# Patient Record
Sex: Female | Born: 1935
Health system: Southern US, Community
[De-identification: ages and names within clinical notes are randomized; demographics above are authoritative.]

## PROBLEM LIST (undated history)

## (undated) DIAGNOSIS — N952 Postmenopausal atrophic vaginitis: Secondary | ICD-10-CM

## (undated) DIAGNOSIS — H409 Unspecified glaucoma: Secondary | ICD-10-CM

## (undated) DIAGNOSIS — I8390 Asymptomatic varicose veins of unspecified lower extremity: Secondary | ICD-10-CM

## (undated) DIAGNOSIS — J159 Unspecified bacterial pneumonia: Secondary | ICD-10-CM

## (undated) DIAGNOSIS — E559 Vitamin D deficiency, unspecified: Secondary | ICD-10-CM

## (undated) DIAGNOSIS — G4700 Insomnia, unspecified: Secondary | ICD-10-CM

## (undated) DIAGNOSIS — R7303 Prediabetes: Secondary | ICD-10-CM

## (undated) DIAGNOSIS — I059 Rheumatic mitral valve disease, unspecified: Secondary | ICD-10-CM

## (undated) DIAGNOSIS — N816 Rectocele: Secondary | ICD-10-CM

## (undated) DIAGNOSIS — N951 Menopausal and female climacteric states: Secondary | ICD-10-CM

## (undated) DIAGNOSIS — R351 Nocturia: Secondary | ICD-10-CM

## (undated) DIAGNOSIS — IMO0002 Reserved for concepts with insufficient information to code with codable children: Secondary | ICD-10-CM

## (undated) HISTORY — DX: Postmenopausal atrophic vaginitis: N95.2

## (undated) HISTORY — PX: VAGINAL HYSTERECTOMY: SUR661

## (undated) HISTORY — DX: Rheumatic mitral valve disease, unspecified: I05.9

## (undated) HISTORY — DX: Insomnia, unspecified: G47.00

## (undated) HISTORY — DX: Menopausal and female climacteric states: N95.1

## (undated) HISTORY — PX: FOOT SURGERY: SHX648

## (undated) HISTORY — PX: BREAST BIOPSY: SHX20

## (undated) HISTORY — DX: Unspecified bacterial pneumonia: J15.9

## (undated) HISTORY — PX: EYE SURGERY: SHX253

## (undated) HISTORY — DX: Nocturia: R35.1

## (undated) HISTORY — DX: Rectocele: N81.6

## (undated) HISTORY — DX: Vitamin D deficiency, unspecified: E55.9

## (undated) HISTORY — DX: Reserved for concepts with insufficient information to code with codable children: IMO0002

## (undated) HISTORY — DX: Prediabetes: R73.03

## (undated) HISTORY — DX: Asymptomatic varicose veins of unspecified lower extremity: I83.90

## (undated) HISTORY — DX: Unspecified glaucoma: H40.9

---

## 2005-05-06 ENCOUNTER — Ambulatory Visit: Payer: Self-pay | Admitting: Ophthalmology

## 2005-08-12 ENCOUNTER — Ambulatory Visit: Payer: Self-pay | Admitting: Gastroenterology

## 2006-07-21 ENCOUNTER — Ambulatory Visit: Payer: Self-pay | Admitting: Cardiology

## 2007-09-19 ENCOUNTER — Encounter: Payer: Self-pay | Admitting: General Practice

## 2007-09-20 ENCOUNTER — Encounter: Payer: Self-pay | Admitting: General Practice

## 2008-03-12 ENCOUNTER — Ambulatory Visit: Payer: Self-pay | Admitting: Unknown Physician Specialty

## 2008-05-15 ENCOUNTER — Ambulatory Visit: Payer: Self-pay | Admitting: Unknown Physician Specialty

## 2009-03-18 ENCOUNTER — Ambulatory Visit: Payer: Self-pay | Admitting: Cardiology

## 2011-03-03 ENCOUNTER — Emergency Department: Payer: Self-pay | Admitting: Emergency Medicine

## 2011-05-21 ENCOUNTER — Ambulatory Visit: Payer: Self-pay | Admitting: Oncology

## 2011-05-25 ENCOUNTER — Ambulatory Visit: Payer: Self-pay | Admitting: Family Medicine

## 2011-06-20 ENCOUNTER — Ambulatory Visit: Payer: Self-pay | Admitting: Oncology

## 2011-09-21 ENCOUNTER — Ambulatory Visit: Payer: Self-pay | Admitting: Family Medicine

## 2011-09-28 ENCOUNTER — Ambulatory Visit: Payer: Self-pay | Admitting: Specialist

## 2012-06-27 DIAGNOSIS — H269 Unspecified cataract: Secondary | ICD-10-CM | POA: Insufficient documentation

## 2012-08-08 DIAGNOSIS — H401490 Capsular glaucoma with pseudoexfoliation of lens, unspecified eye, stage unspecified: Secondary | ICD-10-CM | POA: Insufficient documentation

## 2012-10-03 ENCOUNTER — Ambulatory Visit: Payer: Self-pay | Admitting: Specialist

## 2013-12-28 DIAGNOSIS — H6120 Impacted cerumen, unspecified ear: Secondary | ICD-10-CM | POA: Diagnosis not present

## 2013-12-28 DIAGNOSIS — J301 Allergic rhinitis due to pollen: Secondary | ICD-10-CM | POA: Diagnosis not present

## 2013-12-28 DIAGNOSIS — J069 Acute upper respiratory infection, unspecified: Secondary | ICD-10-CM | POA: Diagnosis not present

## 2014-01-08 DIAGNOSIS — E782 Mixed hyperlipidemia: Secondary | ICD-10-CM | POA: Diagnosis not present

## 2014-01-08 DIAGNOSIS — R002 Palpitations: Secondary | ICD-10-CM | POA: Diagnosis not present

## 2014-02-28 DIAGNOSIS — H4011X0 Primary open-angle glaucoma, stage unspecified: Secondary | ICD-10-CM | POA: Diagnosis not present

## 2014-02-28 DIAGNOSIS — H409 Unspecified glaucoma: Secondary | ICD-10-CM | POA: Diagnosis not present

## 2014-03-06 DIAGNOSIS — J301 Allergic rhinitis due to pollen: Secondary | ICD-10-CM | POA: Diagnosis not present

## 2014-03-06 DIAGNOSIS — J029 Acute pharyngitis, unspecified: Secondary | ICD-10-CM | POA: Diagnosis not present

## 2014-05-08 DIAGNOSIS — D235 Other benign neoplasm of skin of trunk: Secondary | ICD-10-CM | POA: Diagnosis not present

## 2014-05-08 DIAGNOSIS — L259 Unspecified contact dermatitis, unspecified cause: Secondary | ICD-10-CM | POA: Diagnosis not present

## 2014-05-23 DIAGNOSIS — H4011X0 Primary open-angle glaucoma, stage unspecified: Secondary | ICD-10-CM | POA: Diagnosis not present

## 2014-05-23 DIAGNOSIS — H409 Unspecified glaucoma: Secondary | ICD-10-CM | POA: Diagnosis not present

## 2014-06-03 DIAGNOSIS — S0990XA Unspecified injury of head, initial encounter: Secondary | ICD-10-CM | POA: Diagnosis not present

## 2014-06-03 DIAGNOSIS — S0180XA Unspecified open wound of other part of head, initial encounter: Secondary | ICD-10-CM | POA: Diagnosis not present

## 2014-06-04 ENCOUNTER — Ambulatory Visit: Payer: Self-pay | Admitting: Physician Assistant

## 2014-06-04 DIAGNOSIS — S0990XA Unspecified injury of head, initial encounter: Secondary | ICD-10-CM | POA: Diagnosis not present

## 2014-07-05 DIAGNOSIS — J069 Acute upper respiratory infection, unspecified: Secondary | ICD-10-CM | POA: Diagnosis not present

## 2014-07-19 DIAGNOSIS — J0100 Acute maxillary sinusitis, unspecified: Secondary | ICD-10-CM | POA: Diagnosis not present

## 2014-07-19 DIAGNOSIS — H6120 Impacted cerumen, unspecified ear: Secondary | ICD-10-CM | POA: Diagnosis not present

## 2014-07-19 DIAGNOSIS — R040 Epistaxis: Secondary | ICD-10-CM | POA: Diagnosis not present

## 2014-08-13 DIAGNOSIS — I10 Essential (primary) hypertension: Secondary | ICD-10-CM | POA: Diagnosis not present

## 2014-08-13 DIAGNOSIS — Z8679 Personal history of other diseases of the circulatory system: Secondary | ICD-10-CM | POA: Diagnosis not present

## 2014-08-16 DIAGNOSIS — J301 Allergic rhinitis due to pollen: Secondary | ICD-10-CM | POA: Diagnosis not present

## 2014-09-12 DIAGNOSIS — L819 Disorder of pigmentation, unspecified: Secondary | ICD-10-CM | POA: Diagnosis not present

## 2014-09-12 DIAGNOSIS — L821 Other seborrheic keratosis: Secondary | ICD-10-CM | POA: Diagnosis not present

## 2014-09-15 DIAGNOSIS — S6710XA Crushing injury of unspecified finger(s), initial encounter: Secondary | ICD-10-CM | POA: Diagnosis not present

## 2014-09-16 DIAGNOSIS — T148XXA Other injury of unspecified body region, initial encounter: Secondary | ICD-10-CM | POA: Diagnosis not present

## 2014-09-26 DIAGNOSIS — S67190A Crushing injury of right index finger, initial encounter: Secondary | ICD-10-CM | POA: Diagnosis not present

## 2014-10-16 DIAGNOSIS — H4011X3 Primary open-angle glaucoma, severe stage: Secondary | ICD-10-CM | POA: Diagnosis not present

## 2014-10-23 DIAGNOSIS — R7309 Other abnormal glucose: Secondary | ICD-10-CM | POA: Diagnosis not present

## 2014-10-23 DIAGNOSIS — N812 Incomplete uterovaginal prolapse: Secondary | ICD-10-CM | POA: Diagnosis not present

## 2014-10-23 DIAGNOSIS — Z01419 Encounter for gynecological examination (general) (routine) without abnormal findings: Secondary | ICD-10-CM | POA: Diagnosis not present

## 2014-10-23 DIAGNOSIS — N952 Postmenopausal atrophic vaginitis: Secondary | ICD-10-CM | POA: Diagnosis not present

## 2014-10-23 DIAGNOSIS — Z1211 Encounter for screening for malignant neoplasm of colon: Secondary | ICD-10-CM | POA: Diagnosis not present

## 2014-10-23 DIAGNOSIS — N8110 Cystocele, unspecified: Secondary | ICD-10-CM | POA: Diagnosis not present

## 2014-10-23 DIAGNOSIS — N816 Rectocele: Secondary | ICD-10-CM | POA: Diagnosis not present

## 2014-10-23 DIAGNOSIS — N951 Menopausal and female climacteric states: Secondary | ICD-10-CM | POA: Diagnosis not present

## 2014-10-25 DIAGNOSIS — Z1211 Encounter for screening for malignant neoplasm of colon: Secondary | ICD-10-CM | POA: Diagnosis not present

## 2015-01-03 DIAGNOSIS — R7309 Other abnormal glucose: Secondary | ICD-10-CM | POA: Diagnosis not present

## 2015-01-03 DIAGNOSIS — Z01419 Encounter for gynecological examination (general) (routine) without abnormal findings: Secondary | ICD-10-CM | POA: Diagnosis not present

## 2015-01-03 DIAGNOSIS — I059 Rheumatic mitral valve disease, unspecified: Secondary | ICD-10-CM | POA: Diagnosis not present

## 2015-01-03 DIAGNOSIS — I10 Essential (primary) hypertension: Secondary | ICD-10-CM | POA: Diagnosis not present

## 2015-01-03 DIAGNOSIS — E559 Vitamin D deficiency, unspecified: Secondary | ICD-10-CM | POA: Diagnosis not present

## 2015-01-09 DIAGNOSIS — H4011X3 Primary open-angle glaucoma, severe stage: Secondary | ICD-10-CM | POA: Diagnosis not present

## 2015-01-09 DIAGNOSIS — H401123 Primary open-angle glaucoma, left eye, severe stage: Secondary | ICD-10-CM | POA: Insufficient documentation

## 2015-01-09 DIAGNOSIS — H4011X2 Primary open-angle glaucoma, moderate stage: Secondary | ICD-10-CM | POA: Diagnosis not present

## 2015-01-17 DIAGNOSIS — H6121 Impacted cerumen, right ear: Secondary | ICD-10-CM | POA: Diagnosis not present

## 2015-01-17 DIAGNOSIS — J0100 Acute maxillary sinusitis, unspecified: Secondary | ICD-10-CM | POA: Diagnosis not present

## 2015-01-17 DIAGNOSIS — J302 Other seasonal allergic rhinitis: Secondary | ICD-10-CM | POA: Diagnosis not present

## 2015-01-22 DIAGNOSIS — H6121 Impacted cerumen, right ear: Secondary | ICD-10-CM | POA: Diagnosis not present

## 2015-01-22 DIAGNOSIS — H93299 Other abnormal auditory perceptions, unspecified ear: Secondary | ICD-10-CM | POA: Diagnosis not present

## 2015-01-22 DIAGNOSIS — J0181 Other acute recurrent sinusitis: Secondary | ICD-10-CM | POA: Diagnosis not present

## 2015-02-05 DIAGNOSIS — H4011X3 Primary open-angle glaucoma, severe stage: Secondary | ICD-10-CM | POA: Diagnosis not present

## 2015-02-13 DIAGNOSIS — Z87898 Personal history of other specified conditions: Secondary | ICD-10-CM | POA: Diagnosis not present

## 2015-02-13 DIAGNOSIS — I10 Essential (primary) hypertension: Secondary | ICD-10-CM | POA: Diagnosis not present

## 2015-02-13 DIAGNOSIS — R0602 Shortness of breath: Secondary | ICD-10-CM | POA: Diagnosis not present

## 2015-02-21 ENCOUNTER — Emergency Department: Payer: Self-pay | Admitting: Emergency Medicine

## 2015-02-21 DIAGNOSIS — N179 Acute kidney failure, unspecified: Secondary | ICD-10-CM | POA: Diagnosis not present

## 2015-02-21 DIAGNOSIS — J449 Chronic obstructive pulmonary disease, unspecified: Secondary | ICD-10-CM | POA: Diagnosis not present

## 2015-02-21 DIAGNOSIS — Z79899 Other long term (current) drug therapy: Secondary | ICD-10-CM | POA: Diagnosis not present

## 2015-02-21 DIAGNOSIS — R509 Fever, unspecified: Secondary | ICD-10-CM | POA: Diagnosis not present

## 2015-02-21 DIAGNOSIS — R0602 Shortness of breath: Secondary | ICD-10-CM | POA: Diagnosis not present

## 2015-02-21 DIAGNOSIS — J189 Pneumonia, unspecified organism: Secondary | ICD-10-CM | POA: Diagnosis not present

## 2015-02-21 DIAGNOSIS — R7989 Other specified abnormal findings of blood chemistry: Secondary | ICD-10-CM | POA: Diagnosis not present

## 2015-02-21 DIAGNOSIS — R05 Cough: Secondary | ICD-10-CM | POA: Diagnosis not present

## 2015-02-24 DIAGNOSIS — R300 Dysuria: Secondary | ICD-10-CM | POA: Diagnosis not present

## 2015-02-24 DIAGNOSIS — R3915 Urgency of urination: Secondary | ICD-10-CM | POA: Diagnosis not present

## 2015-04-15 DIAGNOSIS — R3915 Urgency of urination: Secondary | ICD-10-CM | POA: Diagnosis not present

## 2015-04-15 DIAGNOSIS — N9489 Other specified conditions associated with female genital organs and menstrual cycle: Secondary | ICD-10-CM | POA: Diagnosis not present

## 2015-04-15 DIAGNOSIS — N951 Menopausal and female climacteric states: Secondary | ICD-10-CM | POA: Diagnosis not present

## 2015-04-15 DIAGNOSIS — Z833 Family history of diabetes mellitus: Secondary | ICD-10-CM | POA: Diagnosis not present

## 2015-04-15 DIAGNOSIS — M549 Dorsalgia, unspecified: Secondary | ICD-10-CM | POA: Diagnosis not present

## 2015-04-15 DIAGNOSIS — I059 Rheumatic mitral valve disease, unspecified: Secondary | ICD-10-CM | POA: Diagnosis not present

## 2015-04-16 DIAGNOSIS — R0602 Shortness of breath: Secondary | ICD-10-CM | POA: Diagnosis not present

## 2015-05-22 DIAGNOSIS — H4011X3 Primary open-angle glaucoma, severe stage: Secondary | ICD-10-CM | POA: Diagnosis not present

## 2015-05-27 DIAGNOSIS — D2371 Other benign neoplasm of skin of right lower limb, including hip: Secondary | ICD-10-CM | POA: Diagnosis not present

## 2015-05-27 DIAGNOSIS — M2011 Hallux valgus (acquired), right foot: Secondary | ICD-10-CM | POA: Diagnosis not present

## 2015-05-27 DIAGNOSIS — M79671 Pain in right foot: Secondary | ICD-10-CM | POA: Diagnosis not present

## 2015-05-27 DIAGNOSIS — M19271 Secondary osteoarthritis, right ankle and foot: Secondary | ICD-10-CM | POA: Diagnosis not present

## 2015-06-13 ENCOUNTER — Other Ambulatory Visit: Payer: Self-pay

## 2015-06-13 ENCOUNTER — Other Ambulatory Visit: Payer: Medicare Other

## 2015-06-13 DIAGNOSIS — R7309 Other abnormal glucose: Secondary | ICD-10-CM | POA: Diagnosis not present

## 2015-06-13 DIAGNOSIS — Z833 Family history of diabetes mellitus: Secondary | ICD-10-CM

## 2015-06-13 DIAGNOSIS — I059 Rheumatic mitral valve disease, unspecified: Secondary | ICD-10-CM | POA: Diagnosis not present

## 2015-06-13 DIAGNOSIS — R636 Underweight: Secondary | ICD-10-CM | POA: Diagnosis not present

## 2015-06-14 LAB — LIPID PANEL
CHOL/HDL RATIO: 2.8 ratio (ref 0.0–4.4)
Cholesterol, Total: 164 mg/dL (ref 100–199)
HDL: 59 mg/dL (ref >39)
LDL Calculated: 86 mg/dL (ref 0–99)
TRIGLYCERIDES: 93 mg/dL (ref 0–149)
VLDL CHOLESTEROL CAL: 19 mg/dL (ref 5–40)

## 2015-06-14 LAB — HEMOGLOBIN A1C
ESTIMATED AVERAGE GLUCOSE: 123 mg/dL
Hgb A1c MFr Bld: 5.9 % — ABNORMAL HIGH (ref 4.8–5.6)

## 2015-06-14 LAB — GLUCOSE, RANDOM: GLUCOSE: 85 mg/dL (ref 65–99)

## 2015-06-16 ENCOUNTER — Telehealth: Payer: Self-pay

## 2015-06-16 NOTE — Telephone Encounter (Signed)
-----   Message from Brayton Mars, MD sent at 06/15/2015  7:09 PM EDT ----- Please notify - Abnormal Labs HgbA1C slightly elevated. Repeat in 6 months. Encourage diet/exercise. FBS and Lipid 1 are normal.

## 2015-06-16 NOTE — Telephone Encounter (Signed)
done

## 2015-06-16 NOTE — Telephone Encounter (Signed)
Pt aware. Will f/u with Mad on 06/18/2015. Will send to [facility] to be put on recall schedule.

## 2015-06-18 ENCOUNTER — Encounter: Payer: Self-pay | Admitting: Obstetrics and Gynecology

## 2015-06-18 ENCOUNTER — Ambulatory Visit (INDEPENDENT_AMBULATORY_CARE_PROVIDER_SITE_OTHER): Payer: Medicare Other | Admitting: Obstetrics and Gynecology

## 2015-06-18 VITALS — BP 124/69 | HR 84 | Ht 60.0 in | Wt 95.7 lb

## 2015-06-18 DIAGNOSIS — Z1239 Encounter for other screening for malignant neoplasm of breast: Secondary | ICD-10-CM | POA: Diagnosis not present

## 2015-06-18 DIAGNOSIS — R7309 Other abnormal glucose: Secondary | ICD-10-CM

## 2015-06-18 DIAGNOSIS — Z780 Asymptomatic menopausal state: Secondary | ICD-10-CM | POA: Diagnosis not present

## 2015-06-18 DIAGNOSIS — R636 Underweight: Secondary | ICD-10-CM

## 2015-06-18 DIAGNOSIS — R7303 Prediabetes: Secondary | ICD-10-CM

## 2015-06-18 NOTE — Progress Notes (Signed)
Patient ID: Sierra Benson, female   DOB: 1929-03-08, 81 y.o.   MRN: [id number] Pt presents for lab results. No using estrace cream. Concerned about ca risk.   OB/GYN CONFERENCE NOTE:  Subjective:       Sierra Benson is a 81 y.o. G25P3003 female who presents for a conference appointment. Current complaints include: 1.  Weight loss. 2.  Prediabetes.  Patient presents today for follow-up on above issues.  She is having difficulty maintaining weight.  We have recommended that she be referred to nutritionist for counseling and retention.  Patient has a slightly elevated hemoglobin A1c.  Multiple questions regarding this condition were addressed.  Patient is to continue eating healthy, exercising, and is to follow up with nutritionist as previously mentioned.    Gynecologic History No LMP recorded. Patient has had a hysterectomy.  Obstetric History OB History  Gravida Para Term Preterm AB SAB TAB Ectopic Multiple Living  3 3 3       3     # Outcome Date GA Lbr Len/2nd Weight Sex Delivery Anes PTL Lv  3 Term 1961   7 lb 4.8 oz (3.311 kg) F VBAC   Y  2 Term 1956   6 lb 8 oz (2.948 kg) M Vag-Spont   Y  1 Term 1954   6 lb 4.8 oz (2.858 kg) M Vag-Spont   Y      Past Medical History  Diagnosis Date  . Vitamin D deficiency   . Mitral valve disorder   . Menopausal state   . Varicose veins   . Glaucoma   . Nocturia   . Rectocele   . Vaginal atrophy   . Cystocele   . Prediabetes 06/19/2015    Past Surgical History  Procedure Laterality Date  . Foot surgery    . Vaginal hysterectomy      menorrhagia  . Eye surgery Left     cataract removed  . Breast biopsy Right     benign nodule    No current outpatient prescriptions on file prior to visit.   No current facility-administered medications on file prior to visit.    Allergies  Allergen Reactions  . Propoxyphene Hives  . Celecoxib Nausea And Vomiting  . Ibuprofen Other (See Comments)  . Prednisone Other (See Comments)     Prednisone Intensol    History   Social History  . Marital Status: Married    Spouse Name: N/A  . Number of Children: N/A  . Years of Education: N/A   Occupational History  . Not on file.   Social History Main Topics  . Smoking status: Never Smoker   . Smokeless tobacco: Not on file  . Alcohol Use: No  . Drug Use: No  . Sexual Activity: Yes    Birth Control/ Protection: Surgical   Other Topics Concern  . Not on file   Social History Narrative    Family History  Problem Relation Age of Onset  . Diabetes Brother   . Lung cancer Brother   . Breast cancer Sister   . Ovarian cancer Neg Hx   . Colon cancer Neg Hx   . Heart disease Neg Hx     The following portions of the patient's history were reviewed and updated as appropriate: allergies, current medications, past family history, past medical history, past social history, past surgical history and problem list.  Review of Systems    Objective:   BP 124/69 mmHg  Pulse 84  Ht 5' (  1.524 m)  Wt 95 lb 11.2 oz (43.409 kg)  BMI 18.69 kg/m2   Assessment/Plan:   1.  Weight loss/underweight.  Last Center consultation  2.  Prediabetes  Continue with healthy diet/exercise/recheck in 6 months.-Hemoglobin A1c     Time: 15 minutes  Return to Clinic:Annual exam as scheduled  A total of 15 minutes were spent face-to-face with the patient during this encounter and over half of that time dealt with counseling and coordination of care.      Brayton Mars, MD [facility]

## 2015-06-19 ENCOUNTER — Encounter: Payer: Self-pay | Admitting: Obstetrics and Gynecology

## 2015-06-19 DIAGNOSIS — R7303 Prediabetes: Secondary | ICD-10-CM

## 2015-06-19 DIAGNOSIS — R636 Underweight: Secondary | ICD-10-CM | POA: Insufficient documentation

## 2015-06-19 DIAGNOSIS — I10 Essential (primary) hypertension: Secondary | ICD-10-CM | POA: Insufficient documentation

## 2015-06-19 DIAGNOSIS — Z87898 Personal history of other specified conditions: Secondary | ICD-10-CM | POA: Insufficient documentation

## 2015-06-19 HISTORY — DX: Prediabetes: R73.03

## 2015-06-24 DIAGNOSIS — M2011 Hallux valgus (acquired), right foot: Secondary | ICD-10-CM | POA: Diagnosis not present

## 2015-06-24 DIAGNOSIS — M19271 Secondary osteoarthritis, right ankle and foot: Secondary | ICD-10-CM | POA: Diagnosis not present

## 2015-06-24 DIAGNOSIS — D2371 Other benign neoplasm of skin of right lower limb, including hip: Secondary | ICD-10-CM | POA: Diagnosis not present

## 2015-06-24 DIAGNOSIS — M79671 Pain in right foot: Secondary | ICD-10-CM | POA: Diagnosis not present

## 2015-07-30 ENCOUNTER — Encounter: Payer: Medicare Other | Attending: Obstetrics and Gynecology | Admitting: Dietician

## 2015-07-30 VITALS — Ht 60.0 in | Wt 95.0 lb

## 2015-07-30 DIAGNOSIS — R636 Underweight: Secondary | ICD-10-CM | POA: Diagnosis not present

## 2015-07-30 NOTE — Patient Instructions (Signed)
   Include a bedtime snack daily, such as crackers with peanut butter and a glass of milk.  Other snack ideas: fruit with nuts or cheese, dry cereal with some nuts and fruit or chocolate.  A small afternoon snack can add another 50-100 calories and further help with some weight gain.

## 2015-07-30 NOTE — Progress Notes (Signed)
Medical Nutrition Therapy: Visit start time: 1330  end time: 1430  Assessment:  Diagnosis: underweight Past medical history: pre-diabetes; patient reports occasional small rash of unknown cause Psychosocial issues/ stress concerns: none per patient Preferred learning method:  . Auditory . Hands-on   Current weight: 95lbs  Height: 5'0" Medications, supplements: reviewed list in chart with patient Progress and evaluation: patient reports recently elevated HbA1C test (5.7%). She decreased her intake of breads and sweets since then and has lost 5lbs.          She would like to regain the lost weight and prevent further increase in blood sugars.          She reports she has always been about 100 - 105 lbs, has a healthy appetite and high metabolism.         She tried drinking a nutrition shake for an afternoon snack, but then was not hungry for supper.   Physical activity: stretches, calisthenics 20 minutes, 3 times per week  Dietary Intake:  Usual eating pattern includes 3 meals and 0-1 snacks per day. Dining out frequency: 5-7 meals per week.  Breakfast: 2 eggs with some cheese, 1pc toast, 1/2 orange, blueberries, 1 small tomato. Rarely bacon. Alternates with cereal -- [employer] 100% whole grain Os Snack: usually none Lunch: salad with grilled chicken, pb and banana sandwich. Lite wheat bread nature's own Snack: 8/9 watermelon. Not regularly Supper: chicken, rice, pasta often. Scallops, potato, 1 roll, salad at [employer] Husband cooks balanced meal.  Snack: occasional peanut butter crackers, milk; or ice cream.  Beverages: water, coffee 2c in am, sometimes sprite or coke with lunch.   Nutrition Care Education: Topics covered: weight gain and blood sugar control Basic nutrition: appropriate nutrient balance, appropriate meal and snack schedule Weight control: determining reasonable weight goal, healthy high calorie foods. Advised 1500kcal daily goal, and achieving that by adding 1-2  snacks daily.  Other lifestyle changes:  Appropriate carbohydrate intake and choices, HbA1C test meaning and goal, diabetic level.  Nutritional Diagnosis:  [place]-3.1 Underweight As related to decreased caloric intake.  As evidenced by patient report, BMI 18.55.  Intervention: Instruction as noted above.   Assured patient that her HbA1C is still at a good level, and limiting sugary drinks and large portions of sweets would likely be adequate diet restrictions at this point.   Advised patient to include a bedtime snack and an afternoon snack daily, including peanut butter or nuts with fruit or bread or crackers to boost kcal intake by 100-200kcal daily.   Did not schedule follow-up at this time but encouraged patient to call and schedule if she finds she needs further RD assistance.   Education Materials given:  Marland Kitchen Goals/ instructions . Gaining Weight the Healthy Way (RD 411)  Learner/ who was taught:  . Patient   Level of understanding: Marland Kitchen Verbalizes/ demonstrates competency  Demonstrated degree of understanding via:   Teach back Learning barriers: . None  Willingness to learn/ readiness for change: . Eager, change in progress   Monitoring and Evaluation:  Dietary intake and body weight      follow up: prn

## 2015-08-26 DIAGNOSIS — E782 Mixed hyperlipidemia: Secondary | ICD-10-CM | POA: Diagnosis not present

## 2015-08-26 DIAGNOSIS — I10 Essential (primary) hypertension: Secondary | ICD-10-CM | POA: Diagnosis not present

## 2015-08-26 DIAGNOSIS — R0602 Shortness of breath: Secondary | ICD-10-CM | POA: Diagnosis not present

## 2015-08-26 DIAGNOSIS — R7309 Other abnormal glucose: Secondary | ICD-10-CM | POA: Diagnosis not present

## 2015-08-26 DIAGNOSIS — Z87898 Personal history of other specified conditions: Secondary | ICD-10-CM | POA: Diagnosis not present

## 2015-09-10 DIAGNOSIS — D2261 Melanocytic nevi of right upper limb, including shoulder: Secondary | ICD-10-CM | POA: Diagnosis not present

## 2015-09-10 DIAGNOSIS — D225 Melanocytic nevi of trunk: Secondary | ICD-10-CM | POA: Diagnosis not present

## 2015-09-10 DIAGNOSIS — L570 Actinic keratosis: Secondary | ICD-10-CM | POA: Diagnosis not present

## 2015-09-10 DIAGNOSIS — D2272 Melanocytic nevi of left lower limb, including hip: Secondary | ICD-10-CM | POA: Diagnosis not present

## 2015-09-10 DIAGNOSIS — X32XXXA Exposure to sunlight, initial encounter: Secondary | ICD-10-CM | POA: Diagnosis not present

## 2015-09-10 DIAGNOSIS — D2271 Melanocytic nevi of right lower limb, including hip: Secondary | ICD-10-CM | POA: Diagnosis not present

## 2015-09-11 ENCOUNTER — Telehealth: Payer: Self-pay | Admitting: Obstetrics and Gynecology

## 2015-09-11 NOTE — Telephone Encounter (Signed)
[facility] told her we need to fax a order for bone density and mammogram.  fax [phone]           phone [phone]

## 2015-09-11 NOTE — Telephone Encounter (Signed)
Pt aware per Mckay at [facility] there is a order on file- it is good until 10/24/2015. Pt states she will try to schedule before 11/4.

## 2015-09-25 DIAGNOSIS — R7303 Prediabetes: Secondary | ICD-10-CM | POA: Diagnosis not present

## 2015-09-25 DIAGNOSIS — E782 Mixed hyperlipidemia: Secondary | ICD-10-CM | POA: Diagnosis not present

## 2015-09-25 DIAGNOSIS — I10 Essential (primary) hypertension: Secondary | ICD-10-CM | POA: Diagnosis not present

## 2015-10-23 DIAGNOSIS — M810 Age-related osteoporosis without current pathological fracture: Secondary | ICD-10-CM | POA: Diagnosis not present

## 2015-10-23 DIAGNOSIS — M859 Disorder of bone density and structure, unspecified: Secondary | ICD-10-CM | POA: Diagnosis not present

## 2015-10-23 DIAGNOSIS — E2839 Other primary ovarian failure: Secondary | ICD-10-CM | POA: Diagnosis not present

## 2015-10-23 DIAGNOSIS — Z1231 Encounter for screening mammogram for malignant neoplasm of breast: Secondary | ICD-10-CM | POA: Diagnosis not present

## 2015-10-28 ENCOUNTER — Ambulatory Visit (INDEPENDENT_AMBULATORY_CARE_PROVIDER_SITE_OTHER): Payer: Medicare Other | Admitting: Obstetrics and Gynecology

## 2015-10-28 ENCOUNTER — Encounter: Payer: Self-pay | Admitting: Obstetrics and Gynecology

## 2015-10-28 VITALS — BP 128/74 | HR 89 | Ht 60.0 in | Wt 99.1 lb

## 2015-10-28 DIAGNOSIS — Z124 Encounter for screening for malignant neoplasm of cervix: Secondary | ICD-10-CM

## 2015-10-28 DIAGNOSIS — Z1211 Encounter for screening for malignant neoplasm of colon: Secondary | ICD-10-CM | POA: Diagnosis not present

## 2015-10-28 DIAGNOSIS — K625 Hemorrhage of anus and rectum: Secondary | ICD-10-CM | POA: Diagnosis not present

## 2015-10-28 DIAGNOSIS — Z0000 Encounter for general adult medical examination without abnormal findings: Secondary | ICD-10-CM | POA: Diagnosis not present

## 2015-10-28 DIAGNOSIS — Z780 Asymptomatic menopausal state: Secondary | ICD-10-CM | POA: Diagnosis not present

## 2015-10-28 DIAGNOSIS — R1031 Right lower quadrant pain: Secondary | ICD-10-CM

## 2015-10-28 DIAGNOSIS — R636 Underweight: Secondary | ICD-10-CM

## 2015-10-28 DIAGNOSIS — M545 Low back pain: Secondary | ICD-10-CM

## 2015-10-28 DIAGNOSIS — Z01419 Encounter for gynecological examination (general) (routine) without abnormal findings: Secondary | ICD-10-CM

## 2015-10-28 LAB — POCT URINALYSIS DIPSTICK
BILIRUBIN UA: NEGATIVE
GLUCOSE UA: NEGATIVE
KETONES UA: NEGATIVE
Leukocytes, UA: NEGATIVE
NITRITE UA: NEGATIVE
PH UA: 6
Protein, UA: NEGATIVE
RBC UA: NEGATIVE
SPEC GRAV UA: 1.025
Urobilinogen, UA: 0.2

## 2015-10-28 NOTE — Patient Instructions (Addendum)
1.  Mammogram already done this year-results pending. 2.  Stool guaiac Cards are given. 3.  Continue calcium with vitamin D supplementation and weightbearing exercise. 4.  GI consultation for rectal bleeding 5.  Return in 1 year

## 2015-10-28 NOTE — Progress Notes (Signed)
Patient ID: Sierra Benson, female   DOB: 17-Jul-1929, 81 y.o.   MRN: [id number] [facility]  ENCOUNTER NOTE  Subjective:       Sierra Benson is a 81 y.o. G47P3003 female here for a routine annual gynecologic exam.  Current complaints: 1.  Blood when wiped after a bm- colonoscopy 10 years ago; Mild right lower quadrant discomfort.   Gynecologic History No LMP recorded. Patient has had a hysterectomy. Contraception: status post hysterectomy Last Pap: 10/23/2014 pap reflx neg. Results were: normal Last mammogram: 10/21/2015. Results were: normal  Obstetric History OB History  Gravida Para Term Preterm AB SAB TAB Ectopic Multiple Living  3 3 3       3     # Outcome Date GA Lbr Len/2nd Weight Sex Delivery Anes PTL Lv  3 Term 1961   7 lb 4.8 oz (3.311 kg) F VBAC   Y  2 Term 1956   6 lb 8 oz (2.948 kg) M Vag-Spont   Y  1 Term 1954   6 lb 4.8 oz (2.858 kg) M Vag-Spont   Y      Past Medical History  Diagnosis Date  . Vitamin D deficiency   . Mitral valve disorder   . Menopausal state   . Varicose veins   . Glaucoma   . Nocturia   . Rectocele   . Vaginal atrophy   . Cystocele   . Prediabetes 06/19/2015    Past Surgical History  Procedure Laterality Date  . Foot surgery    . Vaginal hysterectomy      menorrhagia  . Eye surgery Left     cataract removed  . Breast biopsy Right     benign nodule    Current Outpatient Prescriptions on File Prior to Visit  Medication Sig Dispense Refill  . bimatoprost (LUMIGAN) 0.01 % SOLN Apply to eye.    . fluocinonide (LIDEX) 0.05 % external solution Apply topically.    . Magnesium 100 MG CAPS 400 mg daily at 12 noon.     . Multiple Vitamins-Minerals (MULTIVITAMIN ADULT PO) Take by mouth.     No current facility-administered medications on file prior to visit.    Allergies  Allergen Reactions  . Propoxyphene Hives  . Celecoxib Nausea And Vomiting  . Ibuprofen Other (See Comments)  . Prednisone Other (See Comments)     Prednisone Intensol    Social History   Social History  . Marital Status: Married    Spouse Name: N/A  . Number of Children: N/A  . Years of Education: N/A   Occupational History  . Not on file.   Social History Main Topics  . Smoking status: Never Smoker   . Smokeless tobacco: Not on file  . Alcohol Use: No  . Drug Use: No  . Sexual Activity: Yes    Birth Control/ Protection: Surgical   Other Topics Concern  . Not on file   Social History Narrative    Family History  Problem Relation Age of Onset  . Diabetes Brother   . Lung cancer Brother   . Breast cancer Sister   . Ovarian cancer Neg Hx   . Colon cancer Neg Hx   . Heart disease Neg Hx     The following portions of the patient's history were reviewed and updated as appropriate: allergies, current medications, past family history, past medical history, past social history, past surgical history and problem list.  Review of Systems ROS Review of Systems -  General ROS: negative for - chills, fatigue, fever, hot flashes, night sweats, weight gain.  POSITIVE-weight loss Psychological ROS: negative for - anxiety, decreased libido, depression, mood swings, physical abuse or sexual abuse Ophthalmic ROS: negative for - blurry vision, eye pain or loss of vision ENT ROS: negative for - headaches, hearing change, visual changes or vocal changes Allergy and Immunology ROS: negative for - hives, itchy/watery eyes or seasonal allergies Hematological and Lymphatic ROS: negative for - bleeding problems, bruising, swollen lymph nodes or weight loss Endocrine ROS: negative for - galactorrhea, hair pattern changes, hot flashes, malaise/lethargy, mood swings, palpitations, polydipsia/polyuria, skin changes, temperature intolerance or unexpected weight changes Breast ROS: negative for - new or changing breast lumps or nipple discharge Respiratory ROS: negative for - cough or shortness of breath Cardiovascular ROS: negative for -  chest pain, irregular heartbeat, palpitations or shortness of breath Gastrointestinal ROS: no abdominal pain, change in bowel habits, or black or bloody stools Genito-Urinary ROS: no dysuria, trouble voiding, or hematuria Musculoskeletal ROS: negative for - joint pain or joint stiffness Neurological ROS: negative for - bowel and bladder control changes Dermatological ROS: negative for rash and skin lesion changes   Objective:   BP 128/74 mmHg  Pulse 89  Ht 5' (1.524 m)  Wt 99 lb 1.6 oz (44.951 kg)  BMI 19.35 kg/m2 CONSTITUTIONAL: Well-developed, well-nourished female in no acute distress.  PSYCHIATRIC: Normal mood and affect. Normal behavior. Normal judgment and thought content. [place]: Alert and oriented to person, place, and time. Normal muscle tone coordination. No cranial nerve deficit noted. HENT:  Normocephalic, atraumatic, External right and left ear normal. Oropharynx is clear and moist EYES: Conjunctivae and EOM are normal. Pupils are equal, round, and reactive to light. No scleral icterus.  NECK: Normal range of motion, supple, no masses.  Normal thyroid.  SKIN: Skin is warm and dry. No rash noted. Not diaphoretic. No erythema. No pallor. CARDIOVASCULAR: Normal heart rate noted, regular rhythm, no murmur. RESPIRATORY: Clear to auscultation bilaterally. Effort and breath sounds normal, no problems with respiration noted. BREASTS: Symmetric in size. No masses, skin changes, nipple drainage, or lymphadenopathy. ABDOMEN: Soft, normal bowel sounds, no distention noted.  No tenderness, rebound or guarding.  BLADDER: Normal PELVIC:  External Genitalia: Normal  BUS: Normal  Vagina: Normal  Cervix: Surgically absent  Uterus: Surgically absent  Adnexa: Normal  RV: External Exam NormaI, No Rectal Masses and Normal Sphincter tone  MUSCULOSKELETAL: Normal range of motion. No tenderness.  No cyanosis, clubbing, or edema.  2+ distal pulses. LYMPHATIC: No Axillary, Supraclavicular, or  Inguinal Adenopathy.    Assessment:   Annual gynecologic examination 81 y.o. Contraception: status post hysterectomy Under weight Problem List Items Addressed This Visit    None      Plan:  Pap: Not needed Mammogram: Not Indicated Stool Guaiac Testing:  Ordered; GI consult ordered Labs:None Routine preventative health maintenance measures emphasized: Exercise/Diet/Weight control, Tobacco Warnings, Alcohol/Substance use risks and Safe Sex Lubricants for intercourse as needed Continue calcium with vitamin D supplementation and weightbearing exercise Return to [place], CMA  Brayton Mars, MD  Note: This dictation was prepared with Dragon dictation along with smaller phrase technology. Any transcriptional errors that result from this process are unintentional.

## 2015-10-29 ENCOUNTER — Telehealth: Payer: Self-pay | Admitting: Obstetrics and Gynecology

## 2015-10-29 DIAGNOSIS — K625 Hemorrhage of anus and rectum: Secondary | ICD-10-CM | POA: Insufficient documentation

## 2015-10-29 DIAGNOSIS — R1031 Right lower quadrant pain: Secondary | ICD-10-CM | POA: Insufficient documentation

## 2015-10-29 LAB — URINE CULTURE: Organism ID, Bacteria: NO GROWTH

## 2015-10-29 NOTE — Telephone Encounter (Signed)
Pt called for bone density and mammo results.

## 2015-10-30 ENCOUNTER — Telehealth: Payer: Self-pay | Admitting: Gastroenterology

## 2015-10-30 NOTE — Telephone Encounter (Signed)
I have called pt to make an appointment with Dr Allen Norris, per referral. Patient would like to call back once she gets her work schedule to make an appointment.

## 2015-10-31 NOTE — Telephone Encounter (Signed)
Pt notified of bone density-Osteopenia but wanted to know exactly what areas are a concern.

## 2015-11-04 NOTE — Telephone Encounter (Signed)
Pt aware.

## 2015-11-20 DIAGNOSIS — J159 Unspecified bacterial pneumonia: Secondary | ICD-10-CM

## 2015-11-20 HISTORY — DX: Unspecified bacterial pneumonia: J15.9

## 2015-11-25 DIAGNOSIS — H401132 Primary open-angle glaucoma, bilateral, moderate stage: Secondary | ICD-10-CM | POA: Diagnosis not present

## 2015-11-26 DIAGNOSIS — Z8371 Family history of colonic polyps: Secondary | ICD-10-CM | POA: Diagnosis not present

## 2015-11-26 DIAGNOSIS — K625 Hemorrhage of anus and rectum: Secondary | ICD-10-CM | POA: Diagnosis not present

## 2015-12-08 DIAGNOSIS — J069 Acute upper respiratory infection, unspecified: Secondary | ICD-10-CM | POA: Diagnosis not present

## 2015-12-10 ENCOUNTER — Encounter: Payer: Self-pay | Admitting: *Deleted

## 2015-12-10 ENCOUNTER — Emergency Department: Payer: Medicare Other

## 2015-12-10 ENCOUNTER — Emergency Department
Admission: EM | Admit: 2015-12-10 | Discharge: 2015-12-10 | Disposition: A | Discharge status: Home / Self Care | Payer: Medicare Other | Attending: Emergency Medicine | Admitting: Emergency Medicine

## 2015-12-10 DIAGNOSIS — G4700 Insomnia, unspecified: Secondary | ICD-10-CM | POA: Insufficient documentation

## 2015-12-10 DIAGNOSIS — R05 Cough: Secondary | ICD-10-CM | POA: Diagnosis not present

## 2015-12-10 DIAGNOSIS — J189 Pneumonia, unspecified organism: Secondary | ICD-10-CM

## 2015-12-10 DIAGNOSIS — R0602 Shortness of breath: Secondary | ICD-10-CM | POA: Diagnosis not present

## 2015-12-10 DIAGNOSIS — R55 Syncope and collapse: Secondary | ICD-10-CM | POA: Diagnosis not present

## 2015-12-10 DIAGNOSIS — J159 Unspecified bacterial pneumonia: Secondary | ICD-10-CM | POA: Insufficient documentation

## 2015-12-10 DIAGNOSIS — J069 Acute upper respiratory infection, unspecified: Secondary | ICD-10-CM | POA: Diagnosis not present

## 2015-12-10 DIAGNOSIS — F329 Major depressive disorder, single episode, unspecified: Secondary | ICD-10-CM | POA: Insufficient documentation

## 2015-12-10 DIAGNOSIS — F3132 Bipolar disorder, current episode depressed, moderate: Secondary | ICD-10-CM | POA: Diagnosis not present

## 2015-12-10 DIAGNOSIS — F32A Depression, unspecified: Secondary | ICD-10-CM

## 2015-12-10 LAB — CBC
HCT: 43.9 % (ref 35.0–47.0)
Hemoglobin: 14.7 g/dL (ref 12.0–16.0)
MCH: 33.5 pg (ref 26.0–34.0)
MCHC: 33.5 g/dL (ref 32.0–36.0)
MCV: 100 fL (ref 80.0–100.0)
PLATELETS: 218 10*3/uL (ref 150–440)
RBC: 4.39 MIL/uL (ref 3.80–5.20)
RDW: 13.7 % (ref 11.5–14.5)
WBC: 6.9 10*3/uL (ref 3.6–11.0)

## 2015-12-10 LAB — URINALYSIS COMPLETE WITH MICROSCOPIC (ARMC ONLY)
BACTERIA UA: NONE SEEN
Bilirubin Urine: NEGATIVE
Glucose, UA: NEGATIVE mg/dL
Hgb urine dipstick: NEGATIVE
KETONES UR: NEGATIVE mg/dL
LEUKOCYTES UA: NEGATIVE
Nitrite: NEGATIVE
PH: 8 (ref 5.0–8.0)
PROTEIN: NEGATIVE mg/dL
SQUAMOUS EPITHELIAL / LPF: NONE SEEN
Specific Gravity, Urine: 1.032 — ABNORMAL HIGH (ref 1.005–1.030)

## 2015-12-10 LAB — COMPREHENSIVE METABOLIC PANEL
ALBUMIN: 4 g/dL (ref 3.5–5.0)
ALT: 20 U/L (ref 14–54)
ANION GAP: 6 (ref 5–15)
AST: 25 U/L (ref 15–41)
Alkaline Phosphatase: 74 U/L (ref 38–126)
BUN: 18 mg/dL (ref 6–20)
CHLORIDE: 102 mmol/L (ref 101–111)
CO2: 28 mmol/L (ref 22–32)
Calcium: 9.1 mg/dL (ref 8.9–10.3)
Creatinine, Ser: 0.85 mg/dL (ref 0.44–1.00)
GFR calc Af Amer: >60 mL/min (ref >60)
GFR calc non Af Amer: >60 mL/min (ref >60)
GLUCOSE: 233 mg/dL — AB (ref 65–99)
POTASSIUM: 3.8 mmol/L (ref 3.5–5.1)
SODIUM: 136 mmol/L (ref 135–145)
Total Bilirubin: 1.3 mg/dL — ABNORMAL HIGH (ref 0.3–1.2)
Total Protein: 7.3 g/dL (ref 6.5–8.1)

## 2015-12-10 LAB — TROPONIN I: Troponin I: <0.03 ng/mL (ref <0.031)

## 2015-12-10 MED ORDER — IOHEXOL 350 MG/ML SOLN
75.0000 mL | Freq: Once | INTRAVENOUS | Status: AC | PRN
  Administered 2015-12-10: 75 mL via INTRAVENOUS

## 2015-12-10 MED ORDER — LEVOFLOXACIN 500 MG PO TABS
500.0000 mg | ORAL_TABLET | Freq: Every day | ORAL | Status: AC
Start: 2015-12-10 — End: 2015-12-20

## 2015-12-10 MED ORDER — LITHIUM CARBONATE 300 MG PO CAPS: 300.0000 mg | ORAL_CAPSULE | Freq: Every day | ORAL | Status: DC

## 2015-12-10 MED ORDER — TRAZODONE HCL 50 MG PO TABS: 50.0000 mg | ORAL_TABLET | Freq: Every day | ORAL | Status: DC

## 2015-12-10 MED ORDER — TRAZODONE HCL 50 MG PO TABS
50.0000 mg | ORAL_TABLET | Freq: Every evening | ORAL | Status: DC | PRN
Start: 2015-12-10 — End: 2015-12-10
  Filled 2015-12-10: qty 1

## 2015-12-10 MED ORDER — LITHIUM CARBONATE 300 MG PO TABS: 300.0000 mg | ORAL_TABLET | Freq: Every day | ORAL | Status: DC

## 2015-12-10 MED ORDER — LEVOFLOXACIN IN D5W 750 MG/150ML IV SOLN
750.0000 mg | Freq: Once | INTRAVENOUS | Status: AC
Start: 2015-12-10 — End: 2015-12-10
  Administered 2015-12-10: 750 mg via INTRAVENOUS
  Filled 2015-12-10: qty 150

## 2015-12-10 NOTE — Discharge Instructions (Signed)
Community-Acquired Pneumonia, Adult °Pneumonia is an infection of the lungs. There are different types of pneumonia. One type can develop while a person is in a hospital. A different type, called community-acquired pneumonia, develops in people who are not, or have not recently been, in the hospital or other health care facility.  °CAUSES °Pneumonia may be caused by bacteria, viruses, or funguses. Community-acquired pneumonia is often caused by Streptococcus pneumonia bacteria. These bacteria are often passed from one person to another by breathing in droplets from the cough or sneeze of an infected person. °RISK FACTORS °The condition is more likely to develop in: °· People who have chronic diseases, such as chronic obstructive pulmonary disease (COPD), asthma, congestive heart failure, cystic fibrosis, diabetes, or kidney disease. °· People who have early-stage or late-stage HIV. °· People who have sickle cell disease. °· People who have had their spleen removed (splenectomy). °· People who have poor dental hygiene. °· People who have medical conditions that increase the risk of breathing in (aspirating) secretions their own mouth and nose.   °· People who have a weakened immune system (immunocompromised). °· People who smoke. °· People who travel to areas where pneumonia-causing germs commonly exist. °· People who are around animal habitats or animals that have pneumonia-causing germs, including birds, bats, rabbits, cats, and farm animals. °SYMPTOMS °Symptoms of this condition include: °· A dry cough. °· A wet (productive) cough. °· Fever. °· Sweating. °· Chest pain, especially when breathing deeply or coughing. °· Rapid breathing or difficulty breathing. °· Shortness of breath. °· Shaking chills. °· Fatigue. °· Muscle aches. °DIAGNOSIS °Your health care provider will take a medical history and perform a physical exam. You may also have other tests, including: °· Imaging studies of your chest, including  X-rays. °· Tests to check your blood oxygen level and other blood gases. °· Other tests on blood, mucus (sputum), fluid around your lungs (pleural fluid), and urine. °If your pneumonia is severe, other tests may be done to identify the specific cause of your illness. °TREATMENT °The type of treatment that you receive depends on many factors, such as the cause of your pneumonia, the medicines you take, and other medical conditions that you have. For most adults, treatment and recovery from pneumonia may occur at home. In some cases, treatment must happen in a hospital. Treatment may include: °· Antibiotic medicines, if the pneumonia was caused by bacteria. °· Antiviral medicines, if the pneumonia was caused by a virus. °· Medicines that are given by mouth or through an IV tube. °· Oxygen. °· Respiratory therapy. °Although rare, treating severe pneumonia may include: °· Mechanical ventilation. This is done if you are not breathing well on your own and you cannot maintain a safe blood oxygen level. °· Thoracentesis. This procedure removes fluid around one lung or both lungs to help you breathe better. °HOME CARE INSTRUCTIONS °· Take over-the-counter and prescription medicines only as told by your health care provider. °¨ Only take cough medicine if you are losing sleep. Understand that cough medicine can prevent your body's natural ability to remove mucus from your lungs. °¨ If you were prescribed an antibiotic medicine, take it as told by your health care provider. Do not stop taking the antibiotic even if you start to feel better. °· Sleep in a semi-upright position at night. Try sleeping in a reclining chair, or place a few pillows under your head. °· Do not use tobacco products, including cigarettes, chewing tobacco, and e-cigarettes. If you need help quitting, ask your health care provider. °· Drink enough water to keep your urine   clear or pale yellow. This will help to thin out mucus secretions in your  lungs. PREVENTION There are ways that you can decrease your risk of developing community-acquired pneumonia. Consider getting a pneumococcal vaccine if:  You are older than 79 years of age.  You are older than 79 years of age and are undergoing cancer treatment, have chronic lung disease, or have other medical conditions that affect your immune system. Ask your health care provider if this applies to you. There are different types and schedules of pneumococcal vaccines. Ask your health care provider which vaccination option is best for you. You may also prevent community-acquired pneumonia if you take these actions:  Get an influenza vaccine every year. Ask your health care provider which type of influenza vaccine is best for you.  Go to the dentist on a regular basis.  Wash your hands often. Use hand sanitizer if soap and water are not available. SEEK MEDICAL CARE IF:  You have a fever.  You are losing sleep because you cannot control your cough with cough medicine. SEEK IMMEDIATE MEDICAL CARE IF:  You have worsening shortness of breath.  You have increased chest pain.  Your sickness becomes worse, especially if you are an older adult or have a weakened immune system.  You cough up blood.   This information is not intended to replace advice given to you by your health care provider. Make sure you discuss any questions you have with your health care provider.   Document Released: 12/06/2005 Document Revised: 08/27/2015 Document Reviewed: 04/02/2015 Elsevier Interactive Patient Education 2016 [employer]  Major Depressive Disorder Major depressive disorder is a mental illness. It also may be called clinical depression or unipolar depression. Major depressive disorder usually causes feelings of sadness, hopelessness, or helplessness. Some people with this disorder do not feel particularly sad but lose interest in doing things they used to enjoy (anhedonia). Major depressive  disorder also can cause physical symptoms. It can interfere with work, school, relationships, and other normal everyday activities. The disorder varies in severity but is longer lasting and more serious than the sadness we all feel from time to time in our lives. Major depressive disorder often is triggered by stressful life events or major life changes. Examples of these triggers include divorce, loss of your job or home, a move, and the death of a family member or close friend. Sometimes this disorder occurs for no obvious reason at all. People who have family members with major depressive disorder or bipolar disorder are at higher risk for developing this disorder, with or without life stressors. Major depressive disorder can occur at any age. It may occur just once in your life (single episode major depressive disorder). It may occur multiple times (recurrent major depressive disorder). SYMPTOMS People with major depressive disorder have either anhedonia or depressed mood on nearly a daily basis for at least 2 weeks or longer. Symptoms of depressed mood include:  Feelings of sadness (blue or down in the dumps) or emptiness.  Feelings of hopelessness or helplessness.  Tearfulness or episodes of crying (may be observed by others).  Irritability (children and adolescents). In addition to depressed mood or anhedonia or both, people with this disorder have at least four of the following symptoms:  Difficulty sleeping or sleeping too much.   Significant change (increase or decrease) in appetite or weight.   Lack of energy or motivation.  Feelings of guilt and worthlessness.   Difficulty concentrating, remembering, or making decisions.  Unusually  slow movement (psychomotor retardation) or restlessness (as observed by others).   Recurrent wishes for death, recurrent thoughts of self-harm (suicide), or a suicide attempt. People with major depressive disorder commonly have persistent  negative thoughts about themselves, other people, and the world. People with severe major depressive disorder may experiencedistorted beliefs or perceptions about the world (psychotic delusions). They also may see or hear things that are not real (psychotic hallucinations). DIAGNOSIS Major depressive disorder is diagnosed through an assessment by your health care provider. Your health care provider will ask aboutaspects of your daily life, such as mood,sleep, and appetite, to see if you have the diagnostic symptoms of major depressive disorder. Your health care provider may ask about your medical history and use of alcohol or drugs, including prescription medicines. Your health care provider also may do a physical exam and blood work. This is because certain medical conditions and the use of certain substances can cause major depressive disorder-like symptoms (secondary depression). Your health care provider also may refer you to a mental health specialist for further evaluation and treatment. TREATMENT It is important to recognize the symptoms of major depressive disorder and seek treatment. The following treatments can be prescribed for this disorder:   Medicine. Antidepressant medicines usually are prescribed. Antidepressant medicines are thought to correct chemical imbalances in the brain that are commonly associated with major depressive disorder. Other types of medicine may be added if the symptoms do not respond to antidepressant medicines alone or if psychotic delusions or hallucinations occur.  Talk therapy. Talk therapy can be helpful in treating major depressive disorder by providing support, education, and guidance. Certain types of talk therapy also can help with negative thinking (cognitive behavioral therapy) and with relationship issues that trigger this disorder (interpersonal therapy). A mental health specialist can help determine which treatment is best for you. Most people with major  depressive disorder do well with a combination of medicine and talk therapy. Treatments involving electrical stimulation of the brain can be used in situations with extremely severe symptoms or when medicine and talk therapy do not work over time. These treatments include electroconvulsive therapy, transcranial magnetic stimulation, and vagal nerve stimulation.   This information is not intended to replace advice given to you by your health care provider. Make sure you discuss any questions you have with your health care provider.   Document Released: 04/02/2013 Document Revised: 12/27/2014 Document Reviewed: 04/02/2013 Elsevier Interactive Patient Education [employer].

## 2015-12-10 NOTE — ED Notes (Signed)
Per Kerry Dory, TTS, an appt at [facility] has been verified for this patient.

## 2015-12-10 NOTE — Consult Note (Signed)
[facility] Face-to-Face Psychiatry Consult   Reason for Consult:  Depression and insomnia Referring Physician:  Lenise Arena M.D. Patient Identification: Sierra Benson MRN:  [id number] Principal Diagnosis: Bipolar disorder NOS Diagnosis:   Patient Active Problem List   Diagnosis Date Noted  . Right lower quadrant pain [R10.31] 10/29/2015  . Rectal bleeding [K62.5] 10/29/2015  . History of palpitations [Z86.79] 06/19/2015  . BP (high blood pressure) [I10] 06/19/2015  . Underweight due to inadequate caloric intake [R63.6] 06/19/2015  . Prediabetes [R73.03] 06/19/2015  . Chronic glaucoma [H40.1190] 01/09/2015  . 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 Cataract [H26.9] 06/27/2012    Total Time spent with patient: 1 hour  Subjective:   Sierra Benson is a 81 y.o. female   who presents to ER for syncopal episode. This lasted just a few moments according to the husband, she states she did not hit her head but reports feeling tired and that she hasn't slept in 3 days.  HPI:    Most of the history was obtained from the patient as well as review of the chart. Patient reported that she came to the hospital for pneumonia as she has been having upper respiratory infection for the past week. He reported that she has not been sleeping well for the past week as well. She has tried several over-the-counter medications and also went to [facility] who prescribed her Benadryl due to having a viral infection. Patient reported that it affected her negatively and she was unable to sleep. She also tried melatonin in different strategies but it did not help her. Patient also took Sudafed for 2 days but it did not help her with sleep. She reported that she has been feeling very tired due to lack of sleep. Patient reported that she has started feeling depressed as she has not been sleeping well at night. She is looking forward to have a better night's sleep. She'll help improve her symptoms.  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  Patient reported long history of depression in the past when she was diagnosed with low chemical imbalance and was following with Dr. Clovis Riley in the past. She reported that she was discharged from his practice in 2009. She has taken lithium at the lowest possible dose for almost 20 years. She does not remember the dose of the medication. Patient reported that she did well on the lithium. She reported that she is willing to restart the medication at this time. Her husband remains supportive and was present in the room. Patient currently denied having any suicidal homicidal ideations or plans. She appears somewhat exhausted due to the lack of sleep at this time.  Past Psychiatric History:   Reported history of 2 previous psychiatric hospitalization more than 20 years ago. She was admitted at [facility]. She stated that she follow up with Dr. Clovis Riley for many years and was prescribed lithium. She does not remember taking any other medication. She reported that she remained stable on the medication. She has not taken any medication since then. She stated that she feels well most of the time. She is feeling tired due to the lack of sleep at this time. She denied having any perceptual disturbances she denied having any more swings anger anxiety or paranoia.  Risk to Self: Suicidal Ideation: No Suicidal Intent: No Is patient at risk for suicide?: No Suicidal Plan?: No Access to Means: No What has been  your use of drugs/alcohol within the last 12 months?: No abuse reported How many times?: 1 Other Self Harm Risks: None Reported Triggers for Past Attempts: Other (Comment) Intentional Self Injurious Behavior: None Risk to Others: Homicidal Ideation: No Thoughts of Harm to Others: No Current Homicidal Intent: No Current Homicidal Plan: No Access to Homicidal Means: No Identified Victim: None  Reported History of harm to others?: No Assessment of Violence: None Noted Violent Behavior Description: None Reported Does patient have access to weapons?: No Criminal Charges Pending?: No Does patient have a court date: No Prior Inpatient Therapy: Prior Inpatient Therapy: Yes Prior Therapy Dates: 1986 Prior Therapy Facilty/Provider(s): [facility] Reason for Treatment: Bipolar; Depressive Type Prior Outpatient Therapy: Prior Outpatient Therapy: Yes Prior Therapy Dates: 2009 Prior Therapy Facilty/Provider(s): Dr. Clovis Riley Reason for Treatment: Bipolar; Depressive Type Does patient have an ACCT team?: No Does patient have Intensive In-House Services?  : No Does patient have [facility] services? : No Does patient have P4CC services?: No  Past Medical History:  Past Medical History  Diagnosis Date  . Vitamin D deficiency   . Mitral valve disorder   . Menopausal state   . Varicose veins   . Glaucoma   . Nocturia   . Rectocele   . Vaginal atrophy   . Cystocele   . Prediabetes 06/19/2015    Past Surgical History  Procedure Laterality Date  . Foot surgery    . Vaginal hysterectomy      menorrhagia  . Eye surgery Left     cataract removed  . Breast biopsy Right     benign nodule   Family History:  Family History  Problem Relation Age of Onset  . Diabetes Brother   . Lung cancer Brother   . Breast cancer Sister   . Ovarian cancer Neg Hx   . Colon cancer Neg Hx   . Heart disease Neg Hx    Family Psychiatric  History: None reported   Social History: Currently married for the past 89 years. She reported that she has 3 children and they're all married and settled in her life. 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   History  Alcohol Use No     History  Drug Use No    Social History   Social History  . Marital Status: Married    Spouse Name: N/A  . Number of Children: N/A  . Years of Education: N/A   Social History Main  Topics  . Smoking status: Never Smoker   . Smokeless tobacco: None  . Alcohol Use: No  . Drug Use: No  . Sexual Activity: Yes    Birth Control/ Protection: Surgical   Other Topics Concern  . None   Social History Narrative   Additional Social History:    Pain Medications: See PTA Prescriptions: See PTA Over the Counter: See PTA History of alcohol / drug use?: No history of alcohol / drug abuse (No Abuse Reported) Longest period of sobriety (when/how long): No Abuse Reported Negative Consequences of Use:  (No Abuse Reported) Withdrawal Symptoms:  (No Abuse Reported)                     Allergies:   Allergies  Allergen Reactions  . Propoxyphene Hives  . Celecoxib Nausea And Vomiting  . Ibuprofen Other (See Comments)  . Prednisone Other (See Comments)    Prednisone Intensol    Labs:  Results for  orders placed or performed during the hospital encounter of 12/10/15 (from the past 48 hour(s))  CBC     Status: None   Collection Time: 12/10/15  8:13 AM  Result Value Ref Range   WBC 6.9 3.6 - 11.0 K/uL   RBC 4.39 3.80 - 5.20 MIL/uL   Hemoglobin 14.7 12.0 - 16.0 g/dL   HCT 43.9 35.0 - 47.0 %   MCV 100.0 80.0 - 100.0 fL   MCH 33.5 26.0 - 34.0 pg   MCHC 33.5 32.0 - 36.0 g/dL   RDW 13.7 11.5 - 14.5 %   Platelets 218 150 - 440 K/uL  Troponin I     Status: None   Collection Time: 12/10/15  8:13 AM  Result Value Ref Range   Troponin I <0.03 <0.031 ng/mL    Comment:        NO INDICATION OF MYOCARDIAL INJURY.   Comprehensive metabolic panel     Status: Abnormal   Collection Time: 12/10/15  8:13 AM  Result Value Ref Range   Sodium 136 135 - 145 mmol/L   Potassium 3.8 3.5 - 5.1 mmol/L   Chloride 102 101 - 111 mmol/L   CO2 28 22 - 32 mmol/L   Glucose, Bld 233 (H) 65 - 99 mg/dL   BUN 18 6 - 20 mg/dL   Creatinine, Ser 0.85 0.44 - 1.00 mg/dL   Calcium 9.1 8.9 - 10.3 mg/dL   Total Protein 7.3 6.5 - 8.1 g/dL   Albumin 4.0 3.5 - 5.0 g/dL   AST 25 15 - 41 U/L   ALT  20 14 - 54 U/L   Alkaline Phosphatase 74 38 - 126 U/L   Total Bilirubin 1.3 (H) 0.3 - 1.2 mg/dL   GFR calc non Af Amer >60 >60 mL/min   GFR calc Af Amer >60 >60 mL/min    Comment: (NOTE) The eGFR has been calculated using the CKD EPI equation. This calculation has not been validated in all clinical situations. eGFR's persistently <60 mL/min signify possible Chronic Kidney Disease.    Anion gap 6 5 - 15  Urinalysis complete, with microscopic     Status: Abnormal   Collection Time: 12/10/15 11:27 AM  Result Value Ref Range   Color, Urine STRAW (A) YELLOW   APPearance CLEAR (A) CLEAR   Glucose, UA NEGATIVE NEGATIVE mg/dL   Bilirubin Urine NEGATIVE NEGATIVE   Ketones, ur NEGATIVE NEGATIVE mg/dL   Specific Gravity, Urine 1.032 (H) 1.005 - 1.030   Hgb urine dipstick NEGATIVE NEGATIVE   pH 8.0 5.0 - 8.0   Protein, ur NEGATIVE NEGATIVE mg/dL   Nitrite NEGATIVE NEGATIVE   Leukocytes, UA NEGATIVE NEGATIVE   RBC / HPF 0-5 0 - 5 RBC/hpf   WBC, UA 0-5 0 - 5 WBC/hpf   Bacteria, UA NONE SEEN NONE SEEN   Squamous Epithelial / LPF NONE SEEN NONE SEEN    Current Facility-Administered Medications  Medication Dose Route Frequency Provider Last Rate Last Dose  . levofloxacin (LEVAQUIN) IVPB 750 mg  750 mg Intravenous Once Earleen Newport, MD 100 mL/hr at 12/10/15 1040 750 mg at 12/10/15 1040  . lithium carbonate capsule 300 mg  300 mg Oral QHS Rainey Pines, MD       Current Outpatient Prescriptions  Medication Sig Dispense Refill  . bimatoprost (LUMIGAN) 0.01 % SOLN Place 1 drop into both eyes at bedtime.     . magnesium oxide (MAG-OX) 400 MG tablet Take 400 mg by mouth daily.    Marland Kitchen  Melatonin 1 MG TABS Take 1 mg by mouth daily. Along with melatonin 3 mg    . Melatonin 3 MG TABS Take 3 mg by mouth daily. Along with melatonin 1 mg    . Multiple Vitamins-Minerals (MULTIVITAMIN ADULT PO) Take 1 tablet by mouth daily.     . pseudoephedrine (SUDAFED) 30 MG tablet Take 30 mg by mouth 2 (two) times  daily.      Musculoskeletal: Strength & Muscle Tone: within normal limits Gait & Station: normal Patient leans: N/A  Psychiatric Specialty Exam: Review of Systems  HENT: Negative.   Psychiatric/Behavioral: Positive for depression. The patient has insomnia.     Blood pressure 160/84, pulse 80, temperature 97 F (36.1 C), temperature source Oral, resp. rate 11, height 5' (1.524 m), weight 97 lb (43.999 kg), SpO2 93 %.Body mass index is 18.94 kg/(m^2).  General Appearance: Casual, Neat and Well Groomed  Engineer, water::  Fair  Speech:  Slow  Volume:  Decreased  Mood:  Anxious and Depressed  Affect:  Depressed  Thought Process:  Coherent and Goal Directed  Orientation:  Full (Time, Place, and Person)  Thought Content:  WDL  Suicidal Thoughts:  No  Homicidal Thoughts:  No  Memory:  Immediate;   Fair  Judgement:  Fair  Insight:  Fair  Psychomotor Activity:  Normal  Concentration:  Fair  Recall:  [employer] [place]  Language: Fair  Akathisia:  No  Handed:  Right  AIMS (if indicated):     Assets:  Communication Skills Desire for Improvement Physical Health Social Support Transportation  ADL's:  Intact  Cognition: WNL  Sleep:      Treatment Plan Summary: Medication management  Disposition: Patient does not meet criteria for psychiatric inpatient admission. Discussed crisis plan, support from social network, calling 911, coming to the Emergency Department, and calling Suicide Hotline.   Discussed with patient about the medications and I will start her on lithium 300 mg at bedtime. She has responded well to lithium in the past and she agreed with the plan. She will follow-up with Dr. Clovis Riley in his outpatient practice.   she will also be started on trazodone 50 mg at bedtime. Advised her to start taking half a pill and gradually titrate the dose to 50-100 mg at bedtime and she agreed with the plan. She will follow-up with Dr.  Clovis Riley and he will adjust the dose of the  medication for insomnia.  Patient can be discharged from the hospital when she becomes medically stable   . This note was generated in part or whole with voice recognition software. Voice regonition is usually quite accurate but there are transcription errors that can and very often do occur. I apologize for any typographical errors that were not detected and corrected.   Rainey Pines, MD  12/10/2015 11:48 AM

## 2015-12-10 NOTE — ED Notes (Signed)
Pt is being treated for an URI, pt had a syncopal episode today lasting a few minutes per husband, pt did not hit head, pt reports feeling tired, not sleeping in 3 days

## 2015-12-10 NOTE — ED Notes (Signed)
Calvin from TTS at bedside at this time.

## 2015-12-10 NOTE — ED Provider Notes (Addendum)
[facility] Emergency Department Provider Note     Time seen: ----------------------------------------- 8:01 AM on 12/10/2015 -----------------------------------------    I have reviewed the triage vital signs and the nursing notes.   HISTORY  Chief Complaint Loss of Consciousness    HPI Sierra Benson is a 81 y.o. female who presents to ER for syncopal episode today. This lasted just a few moments according to the husband, she states she did not hit her head but reports feeling tired and that she hasn't slept in 3 days. Patient feels like she is depressed, and has had depression in the past to the point where she had be hospitalized but this was 40 years ago. She is been taking Benadryl for upper respiratory infection she thinks that's made her sleeplessness worse. She feels like she needs to be hospitalized for depression this point.   Past Medical History  Diagnosis Date  . Vitamin D deficiency   . Mitral valve disorder   . Menopausal state   . Varicose veins   . Glaucoma   . Nocturia   . Rectocele   . Vaginal atrophy   . Cystocele   . Prediabetes 06/19/2015    Patient Active Problem List   Diagnosis Date Noted  . Right lower quadrant pain 10/29/2015  . Rectal bleeding 10/29/2015  . History of palpitations 06/19/2015  . BP (high blood pressure) 06/19/2015  . Underweight due to inadequate caloric intake 06/19/2015  . Prediabetes 06/19/2015  . Chronic glaucoma 01/09/2015  . 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 Cataract 06/27/2012    Past Surgical History  Procedure Laterality Date  . Foot surgery    . Vaginal hysterectomy      menorrhagia  . Eye surgery Left     cataract removed  . Breast biopsy Right     benign nodule    Allergies Propoxyphene; Celecoxib; Ibuprofen; and Prednisone  Social History Social History  Substance Use Topics  . Smoking status: Never Smoker   . Smokeless tobacco: None  . Alcohol Use: No     Review of Systems Constitutional: Negative for fever. Eyes: Negative for visual changes. ENT: Negative for sore throat. Positive for congestion Cardiovascular: Negative for chest pain. Respiratory: Negative for shortness of breath. Positive for cough Gastrointestinal: Negative for abdominal pain, vomiting and diarrhea. Genitourinary: Negative for dysuria. Musculoskeletal: Negative for back pain. Skin: Negative for rash. Neurological: Negative for headaches, focal weakness or numbness. Psychiatric: Positive for depression, sleep loss  10-point ROS otherwise negative.  ____________________________________________   PHYSICAL EXAM:  VITAL SIGNS: ED Triage Vitals  Enc Vitals Group     BP --      Pulse Rate 12/10/15 0750 94     Resp 12/10/15 0750 20     Temp 12/10/15 0750 97 F (36.1 C)     Temp Source 12/10/15 0750 Oral     SpO2 12/10/15 0750 100 %     Weight 12/10/15 0750 97 lb (43.999 kg)     Height 12/10/15 0750 5' (1.524 m)     Head Cir --      Peak Flow --      Pain Score --      Pain Loc --      Pain Edu? --      Excl. in [place]? --     Constitutional: Alert and oriented. Well appearing and in no distress. Eyes: Conjunctivae are normal. PERRL. Normal extraocular movements. ENT   Head: Normocephalic and atraumatic.   Nose: No congestion/rhinnorhea.  Mouth/Throat: Mucous membranes are moist.   Neck: No stridor. Cardiovascular: Normal rate, regular rhythm. Normal and symmetric distal pulses are present in all extremities. No murmurs, rubs, or gallops. Respiratory: Normal respiratory effort without tachypnea nor retractions. Breath sounds are clear and equal bilaterally. No wheezes/rales/rhonchi. Gastrointestinal: Soft and nontender. No distention. No abdominal bruits.  Musculoskeletal: Nontender with normal range of motion in all extremities. No joint effusions.  No lower extremity tenderness nor edema. Neurologic:  Normal speech and language. No gross  focal neurologic deficits are appreciated. Speech is normal. No gait instability. Skin:  Skin is warm, dry and intact. No rash noted. Psychiatric: Depressed mood and affect. ____________________________________________  EKG: Interpreted by me. Normal sinus rhythm the rate of 90 bpm, normal PR interval, normal QS with, normal QT interval.  ____________________________________________  ED COURSE:  Pertinent labs & imaging results that were available during my care of the patient were reviewed by me and considered in my medical decision making (see chart for details). Patient does indeed appear depressed, syncope is likely secondary to sleep deprivation. I will consult psychiatry for evaluation once she is medically clear. She does not look her stated age. ____________________________________________    LABS (pertinent positives/negatives)  Labs Reviewed  COMPREHENSIVE METABOLIC PANEL - Abnormal; Notable for the following:    Glucose, Bld 233 (*)    Total Bilirubin 1.3 (*)    All other components within normal limits  URINALYSIS COMPLETEWITH MICROSCOPIC ([facility] ONLY) - Abnormal; Notable for the following:    Color, Urine STRAW (*)    APPearance CLEAR (*)    Specific Gravity, Urine 1.032 (*)    All other components within normal limits  CBC  TROPONIN I  CBG MONITORING, ED    RADIOLOGY Images were viewed by me  Chest x-ray IMPRESSION: Large areas of chronic opacity in the RIGHT upper lobe and LEFT perihilar region which may represent postinflammatory scarring though portions of the RIGHT upper lobe opacity appears somewhat more nodular on the current study ; followup CT chest with contrast recommended to exclude developing pulmonary nodules.  IMPRESSION: No pulmonary emboli.  Chronic bronchiectasis and atypical pneumonia pattern strongly suggesting atypical mycobacterium infection. Areas of new involvement are seen when compared to the study 2013 in the right lower lobe  laterally and in the left lower lobe posteriorly. ____________________________________________  FINAL ASSESSMENT AND PLAN  Syncope, depression, pneumonia  Plan: Patient with labs and imaging as dictated above. Patient likely with atypical pneumonia. She's been started on IV Levaquin. She does need to speak with psychiatry in order to determine whether she needs admission or not for depression. She is currently medically stable.   Earleen Newport, MD Psychiatry has evaluated her in the ER and feel she is stable for discharge. She'll be prescribed trazodone and lithium. She'll also be on Levaquin to take as an outpatient.  Earleen Newport, MD 12/10/15 1012  Earleen Newport, MD 12/10/15 [phone]

## 2015-12-10 NOTE — ED Notes (Signed)
Admitting MD at bedside.

## 2015-12-10 NOTE — BH Assessment (Signed)
Talked with patient about discharged options and wanting to set up an outpatient Appointment with her former psychiatrist, Dr. Octavia Heir, now that he is with [facility] She stated she already spoke with him and was advised to follow up with Dr. Gretel Acre, with [facility]. He is going to be out of the country for two months. Writer spoke with Dr. Gretel Acre and she said, have the patient call the office [phone]) and set up an appointment.  Writer updated the ER MD (Dr. Jimmye Norman) and Patient's Howell Rucks Lynnell Grain).

## 2015-12-10 NOTE — BH Assessment (Signed)
Assessment Note  Who presents to the ER due to her husband having concerns about her behaviors. Per husband report, she hasn't slept in 3 days. Her chief compliant is having a lack of sleep. Over the course of 3 days, she's had an average of 3 hours of sleep.  In the last 24 hours, she hadn't slept. "I just laid in the bed and think."  Other symptoms she reports of having are, lack of motivation to do things. Feeling helpless and hopeless.  She's thinking more about aging and the possibility of depending on her family for help.  It is causing depression and anxiety.   She denies having thoughts of hurting herself or anyone else. She had one suicide attempt and it was approximately 40 years ago. She's been inpatient 2 times. She was at "[facility]" and [facility]. They were approximately 40 years ago.  Per the husband, "She was pretty out of it. She was disorganized, irrational and confused." She was received outpatient treatment, since then, until 2009. Her psychiatrist discharged her, due to being stable. She as on lithium for those 20 years. In 2009, she was tapered off it. She states she was diagnosed with Low Manic Depressive, Chemical Imbalance."  Patient is active and independent. 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 She worked approximately 3 days a week. Prior to that she was working approximately 5 days a week. This change took place, within the last two weeks. She mostly travels to other venues to make sure things are in order and manages the company. She drives, take care of her ADL's without assistance or devices.  Patient denies SI/HI and AV/H. She have no involvement with the legal system. She uses no mind altering substances. No history of violence and aggression.  Diagnosis: Depresion  Past Medical History:  Past Medical History  Diagnosis Date  . Vitamin D deficiency   . Mitral valve disorder   . Menopausal state   . Varicose veins   . Glaucoma   .  Nocturia   . Rectocele   . Vaginal atrophy   . Cystocele   . Prediabetes 06/19/2015    Past Surgical History  Procedure Laterality Date  . Foot surgery    . Vaginal hysterectomy      menorrhagia  . Eye surgery Left     cataract removed  . Breast biopsy Right     benign nodule    Family History:  Family History  Problem Relation Age of Onset  . Diabetes Brother   . Lung cancer Brother   . Breast cancer Sister   . Ovarian cancer Neg Hx   . Colon cancer Neg Hx   . Heart disease Neg Hx     Social History:  reports that she has never smoked. She does not have any smokeless tobacco history on file. She reports that she does not drink alcohol or use illicit drugs.  Additional Social History:  Alcohol / Drug Use Pain Medications: See PTA Prescriptions: See PTA Over the Counter: See PTA History of alcohol / drug use?: No history of alcohol / drug abuse (No Abuse Reported) Longest period of sobriety (when/how long): No Abuse Reported Negative Consequences of Use:  (No Abuse Reported) Withdrawal Symptoms:  (No Abuse Reported)  CIWA: CIWA-Ar BP: (!) 160/84 mmHg Pulse Rate: 80 COWS:    Allergies:  Allergies  Allergen Reactions  . Propoxyphene Hives  . Celecoxib Nausea And Vomiting  . Ibuprofen Other (See Comments)  .  Prednisone Other (See Comments)    Prednisone Intensol    Home Medications:  (Not in a hospital admission)  OB/GYN Status:  No LMP recorded. Patient has had a hysterectomy.  General Assessment Data Location of Assessment: [facility] ED TTS Assessment: In system Is this a Tele or Face-to-Face Assessment?: Face-to-Face Is this an Initial Assessment or a Re-assessment for this encounter?: Initial Assessment Marital status: Married [place] name: Woof Is patient pregnant?: No Pregnancy Status: No Living Arrangements: Spouse/significant other Can pt return to current living arrangement?: Yes Admission Status: Voluntary Is patient capable of signing voluntary  admission?: Yes Referral Source: Self/Family/Friend Insurance type: Medicare  Medical Screening Exam ([place]) Medical Exam completed: Yes  Crisis Care Plan Living Arrangements: Spouse/significant other Legal Guardian:  (None) Name of Psychiatrist: None Name of Therapist: None  Education Status Is patient currently in school?: No Current Grade: n/a Highest grade of school patient has completed: Associate Degree Name of school: n/a Contact person: n/a  Risk to self with the past 6 months Suicidal Ideation: No Has patient been a risk to self within the past 6 months prior to admission? : No Suicidal Intent: No Has patient had any suicidal intent within the past 6 months prior to admission? : No Is patient at risk for suicide?: No Suicidal Plan?: No Has patient had any suicidal plan within the past 6 months prior to admission? : No Access to Means: No What has been your use of drugs/alcohol within the last 12 months?: No abuse reported Previous Attempts/Gestures: Yes How many times?: 1 Other Self Harm Risks: None Reported Triggers for Past Attempts: Other (Comment) Intentional Self Injurious Behavior: None Family Suicide History: Yes (Fraternal Uncle ) Recent stressful life event(s): Other (Comment) (Difficultly going to sleep) Persecutory voices/beliefs?: No Depression: Yes Depression Symptoms: Loss of interest in usual pleasures, Fatigue, Insomnia Substance abuse history and/or treatment for substance abuse?: No Suicide prevention information given to non-admitted patients: Not applicable  Risk to Others within the past 6 months Homicidal Ideation: No Does patient have any lifetime risk of violence toward others beyond the six months prior to admission? : No Thoughts of Harm to Others: No Current Homicidal Intent: No Current Homicidal Plan: No Access to Homicidal Means: No Identified Victim: None Reported History of harm to others?: No Assessment of  Violence: None Noted Violent Behavior Description: None Reported Does patient have access to weapons?: No Criminal Charges Pending?: No Does patient have a court date: No Is patient on probation?: No  Psychosis Hallucinations: None noted Delusions: None noted  Mental Status Report Appearance/Hygiene: In scrubs, In hospital gown, Unable to Assess Eye Contact: Good Motor Activity: Unable to assess (Patient lying in the bed) Speech: Logical/coherent, Soft Level of Consciousness: Alert, Drowsy Mood: Depressed, Sad, Pleasant Affect: Appropriate to circumstance, Depressed Anxiety Level: Minimal Thought Processes: Coherent, Relevant Judgement: Unimpaired Orientation: Person, Place, Time, Situation, Appropriate for developmental age Obsessive Compulsive Thoughts/Behaviors: Minimal  Cognitive Functioning Concentration: Normal Memory: Recent Intact, Remote Intact IQ: Average Insight: Fair Impulse Control: Good Appetite: Good Weight Loss: 0 Weight Gain: 0 Sleep: Decreased Total Hours of Sleep: 3 Vegetative Symptoms: None  ADLScreening [facility] Assessment Services) Patient's cognitive ability adequate to safely complete daily activities?: Yes Patient able to express need for assistance with ADLs?: Yes Independently performs ADLs?: Yes (appropriate for developmental age)  Prior Inpatient Therapy Prior Inpatient Therapy: Yes Prior Therapy Dates: 1986 Prior Therapy Facilty/Provider(s): [facility] and [facility] Reason for Treatment: Bipolar; Depressive Type  Prior Outpatient Therapy Prior Outpatient  Therapy: Yes Prior Therapy Dates: 2009 Prior Therapy Facilty/Provider(s): Dr. Clovis Riley Reason for Treatment: Bipolar; Depressive Type Does patient have an ACCT team?: No Does patient have Intensive In-House Services?  : No Does patient have [facility] services? : No Does patient have P4CC services?: No  ADL Screening (condition at time of admission) Patient's cognitive ability adequate to safely  complete daily activities?: Yes Is the patient deaf or have difficulty hearing?: No Does the patient have difficulty seeing, even when wearing glasses/contacts?: No Does the patient have difficulty concentrating, remembering, or making decisions?: No Patient able to express need for assistance with ADLs?: Yes Does the patient have difficulty dressing or bathing?: No Independently performs ADLs?: Yes (appropriate for developmental age) Does the patient have difficulty walking or climbing stairs?: No Weakness of Legs: None Weakness of Arms/Hands: None  Home Assistive Devices/Equipment Home Assistive Devices/Equipment: None  Therapy Consults (therapy consults require a physician order) PT Evaluation Needed: No OT Evalulation Needed: No SLP Evaluation Needed: No Abuse/Neglect Assessment (Assessment to be complete while patient is alone) Physical Abuse: Denies Verbal Abuse: Denies Sexual Abuse: Denies Exploitation of patient/patient's resources: Denies Self-Neglect: Denies Values / Beliefs Cultural Requests During Hospitalization: None Spiritual Requests During Hospitalization: None Consults Spiritual Care Consult Needed: No Social Work Consult Needed: No Regulatory affairs officer (For Healthcare) Does patient have an advance directive?: No Would patient like information on creating an advanced directive?: No - patient declined information    Additional Information 1:1 In Past 12 Months?: No CIRT Risk: No Elopement Risk: No Does patient have medical clearance?: No  Child/Adolescent Assessment Running Away Risk: Denies (Patient is an adult)  Disposition:  Disposition Initial Assessment Completed for this Encounter: Yes  On Site Evaluation by:   Reviewed with Physician:     Gunnar Fusi, MS, LCAS, LPC, [place], CCSI 12/10/2015 11:32 AM

## 2015-12-10 NOTE — ED Notes (Signed)
Pt with clear resp bilat.  Denies chest pain, family at bedside.  Pt placed on CM, iv started and labs drawn and sent pt alert and oriented.  Pt reports about 3 days pta she was seen at urgent care for cold symptoms, was started on benadryl but states it caused her to not sleep so she has not slept in 4 days.  Pt appears tired.  States she feels dehydrated and weak.

## 2015-12-18 ENCOUNTER — Ambulatory Visit: Payer: Medicare Other | Admitting: Gastroenterology

## 2016-01-18 IMAGING — CT CT HEAD WITHOUT CONTRAST
2 series · 15 of 30 positions shown, 17 images · non-contrast
Comparison: None.

CLINICAL DATA: Hit head.

EXAM:
CT HEAD WITHOUT CONTRAST
TECHNIQUE: Contiguous axial images were obtained from the base of the skull
through the vertex without intravenous contrast.

[Series 2: head wo · axial · 0.41mm/px · z∈[-56,+44]mm · 7 of 28 slices shown, 9 images]
[im 4/28  brain]
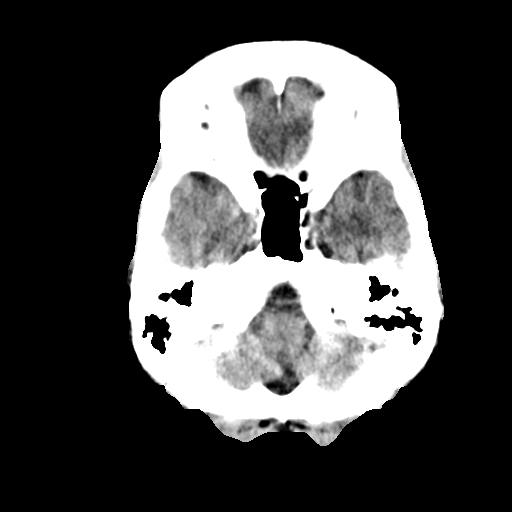
[im 4/28  bone]
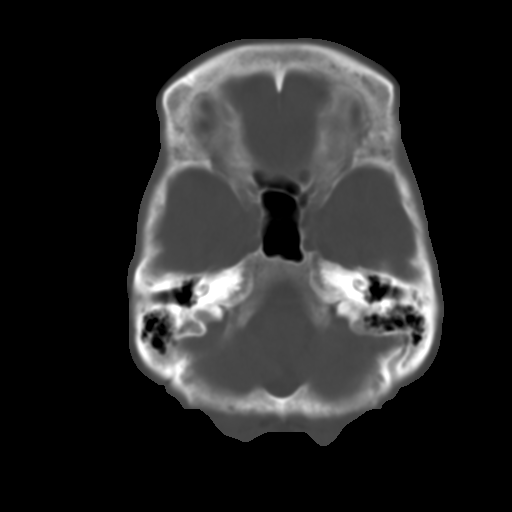
[im 7/28  brain]
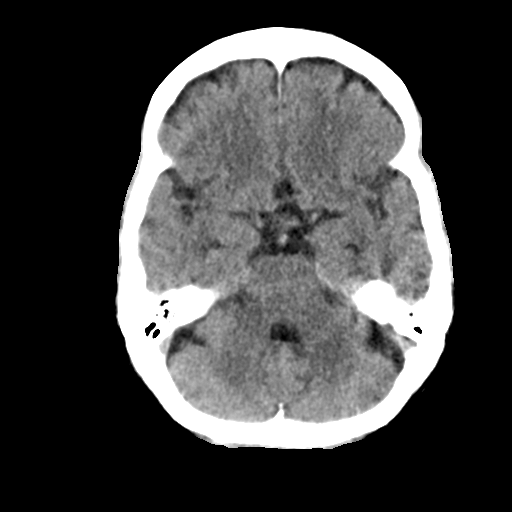
[im 11/28  brain]
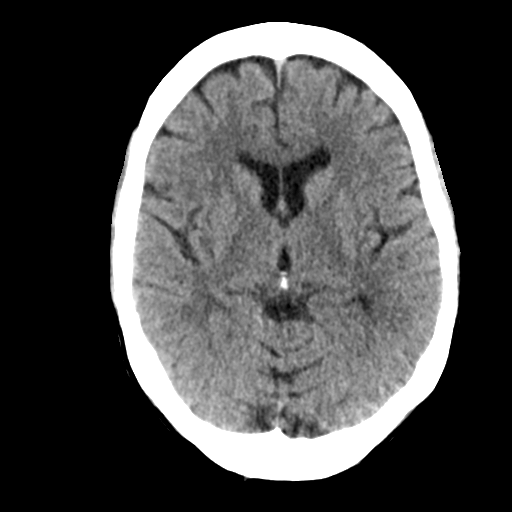
[im 14/28  brain]
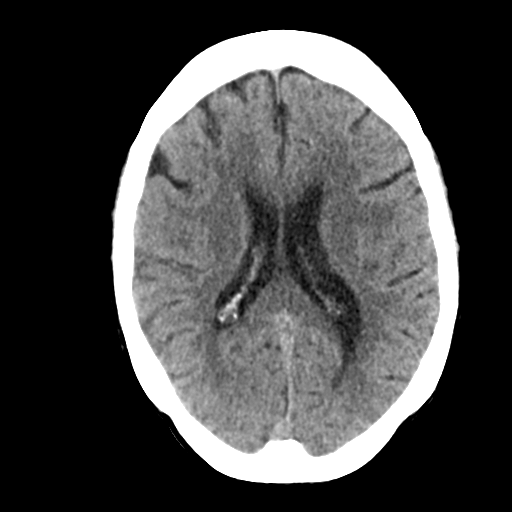
[im 17/28  brain]
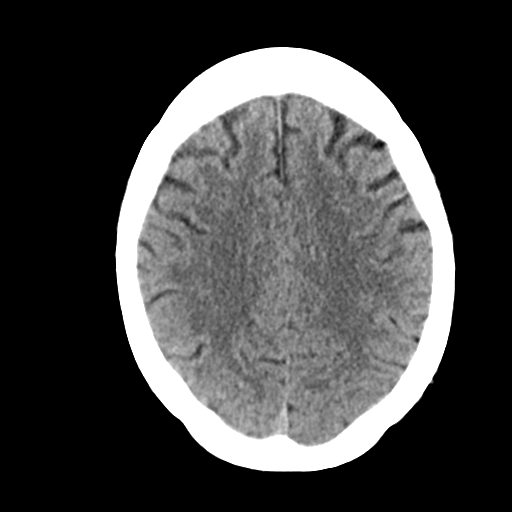
[im 17/28  bone]
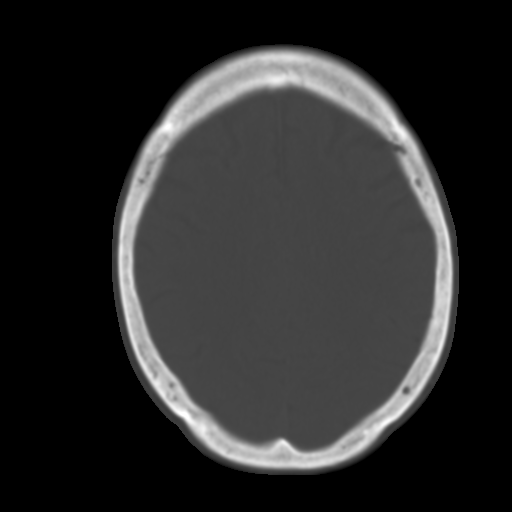
[im 21/28  brain]
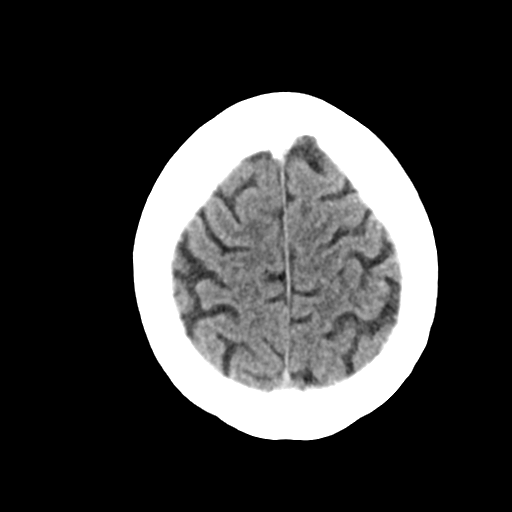
[im 24/28  brain]
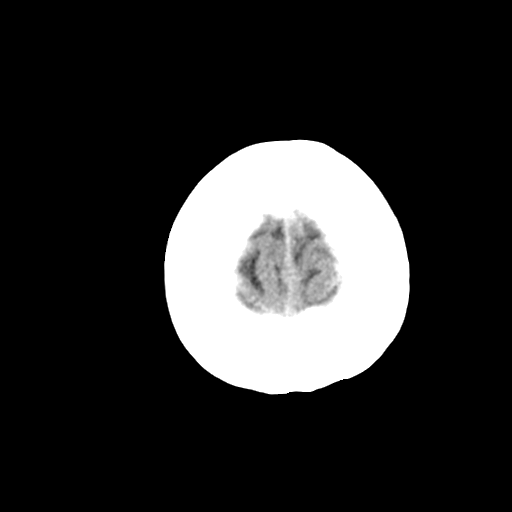

[Series 3: head bone · axial · 0.41mm/px · z∈[-59,+51]mm · 8 of 69 slices shown]
[im 7/69  bone]
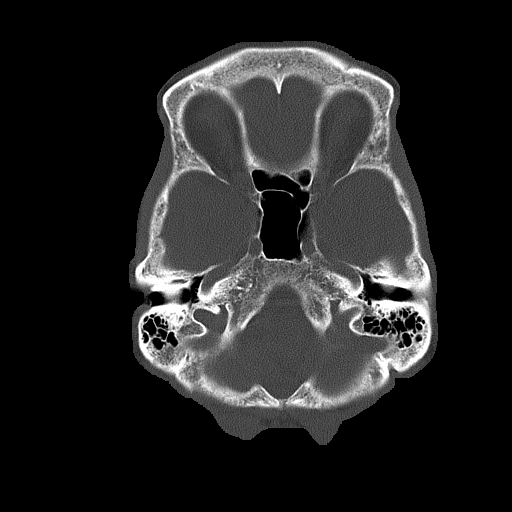
[im 14/69  bone]
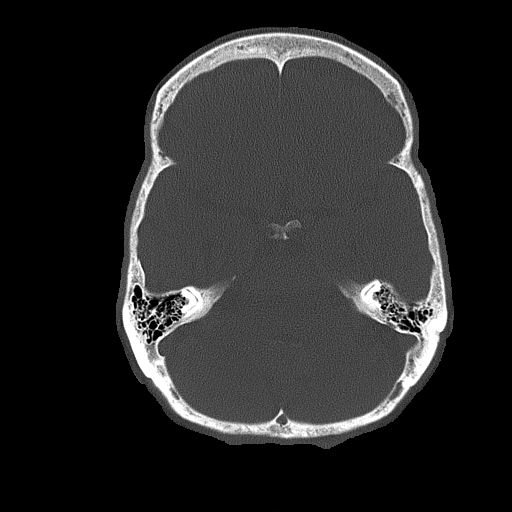
[im 21/69  bone]
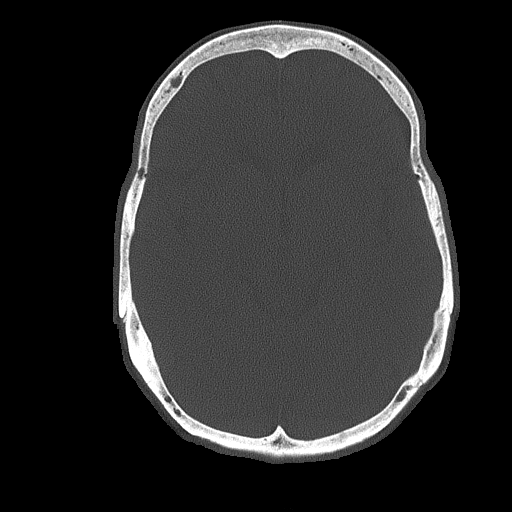
[im 31/69  bone]
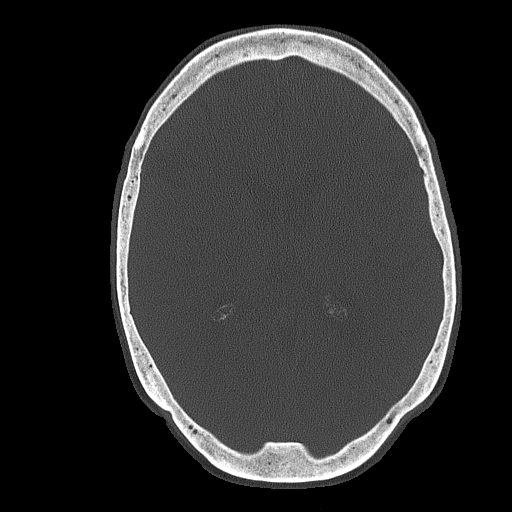
[im 38/69  bone]
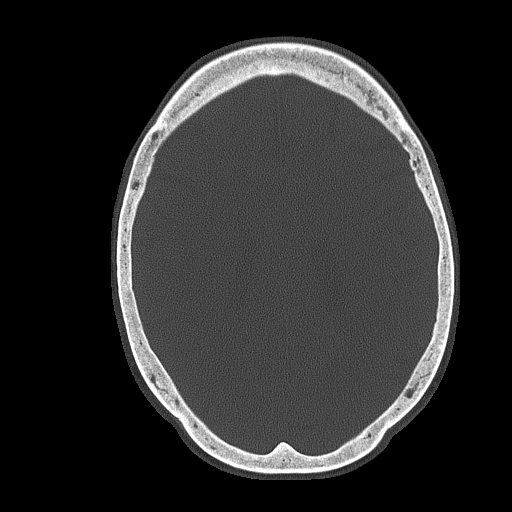
[im 48/69  bone]
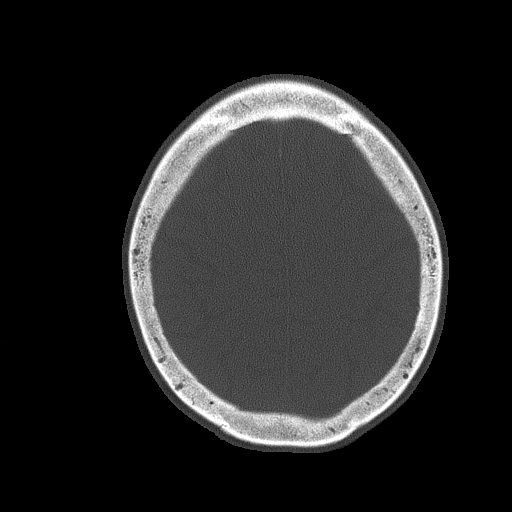
[im 55/69  bone]
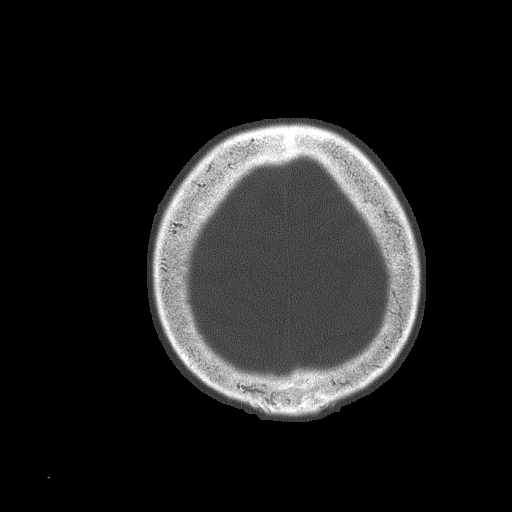
[im 62/69  bone]
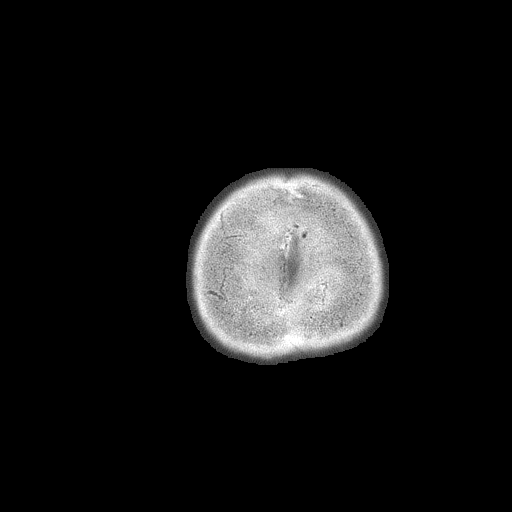

[15 of 30 positions shown; findings below may reference images not displayed]

FINDINGS: No acute intracranial abnormality. Specifically, no hemorrhage,
hydrocephalus, mass lesion, acute infarction, or significant
intracranial injury. No acute calvarial abnormality. Visualized
paranasal sinuses and mastoids clear. Orbital soft tissues
unremarkable.
IMPRESSION: Negative study.

## 2016-01-22 DIAGNOSIS — H401132 Primary open-angle glaucoma, bilateral, moderate stage: Secondary | ICD-10-CM | POA: Diagnosis not present

## 2016-02-23 DIAGNOSIS — K1120 Sialoadenitis, unspecified: Secondary | ICD-10-CM | POA: Diagnosis not present

## 2016-02-27 DIAGNOSIS — F3175 Bipolar disorder, in partial remission, most recent episode depressed: Secondary | ICD-10-CM | POA: Diagnosis not present

## 2016-03-10 DIAGNOSIS — H401133 Primary open-angle glaucoma, bilateral, severe stage: Secondary | ICD-10-CM | POA: Diagnosis not present

## 2016-03-29 ENCOUNTER — Telehealth: Payer: Self-pay | Admitting: Obstetrics and Gynecology

## 2016-03-29 NOTE — Telephone Encounter (Signed)
Pt c/o of upper back pain. Right side also. Lower pelvic pain x 2weeks. NO urgency or pain with urination. DX with pnemonia 11/2015. NO fevers noted. Made pt appt on 4/11 at 1:30.

## 2016-03-29 NOTE — Telephone Encounter (Signed)
PT CALLED AND SHE THINKS SHE MIGHT HAVE A KIDNEY OR BLADDER OR UTI  INFECTION, SHE IS HAVING PAIN IN THE BACK LOWER RIGHT SIDE AREA WHEN SHE URINATES AND OTHER TIMES TOO, ALSO IN LOWER PELVIC REGION, NO HEMATURIA, URGENCY TO URINATE, NO BURNING WHEN URINATING, AND THIS HAS BEEN GOING ON FOR A FEW WEEKS OFF AND ON. UNSURE IF SHE NEEDS TO SEE DR DE OR CAN SHE COME IN A DROP OFF URINE SAMPLE.

## 2016-03-30 ENCOUNTER — Ambulatory Visit (INDEPENDENT_AMBULATORY_CARE_PROVIDER_SITE_OTHER): Payer: Medicare Other | Admitting: Obstetrics and Gynecology

## 2016-03-30 ENCOUNTER — Encounter: Payer: Self-pay | Admitting: Obstetrics and Gynecology

## 2016-03-30 ENCOUNTER — Ambulatory Visit
Admission: RE | Admit: 2016-03-30 | Discharge: 2016-03-30 | Disposition: A | Discharge status: Home / Self Care | Payer: Medicare Other | Source: Ambulatory Visit | Attending: Obstetrics and Gynecology | Admitting: Obstetrics and Gynecology

## 2016-03-30 VITALS — BP 146/76 | HR 88 | Temp 97.6°F | Ht 60.0 in | Wt 95.6 lb

## 2016-03-30 DIAGNOSIS — J159 Unspecified bacterial pneumonia: Secondary | ICD-10-CM

## 2016-03-30 DIAGNOSIS — R3915 Urgency of urination: Secondary | ICD-10-CM

## 2016-03-30 DIAGNOSIS — R1031 Right lower quadrant pain: Secondary | ICD-10-CM | POA: Diagnosis not present

## 2016-03-30 DIAGNOSIS — J189 Pneumonia, unspecified organism: Secondary | ICD-10-CM | POA: Diagnosis not present

## 2016-03-30 LAB — POCT URINALYSIS DIPSTICK
Bilirubin, UA: 1
Blood, UA: NEGATIVE
Glucose, UA: NEGATIVE
Ketones, UA: NEGATIVE
Leukocytes, UA: NEGATIVE
Nitrite, UA: NEGATIVE
Spec Grav, UA: 1.025
Urobilinogen, UA: 0.2
pH, UA: 6

## 2016-03-30 MED ORDER — AZITHROMYCIN 250 MG PO TABS: ORAL_TABLET | ORAL | Status: DC

## 2016-03-30 NOTE — Progress Notes (Signed)
Chief complaint: 1. Right flank pain 2. Urinary urgency 3. History  Of pneumonia    December 2016:  Patient was seen in the emergency room for syncope episode ; diagnosed with pneumonia and treated with IM antibiotic and Levaquin ; depression was diagnosed and patient was started on lithium with recommendation to follow-up at [facility] psychiatry    January 2017 : due to psychiatry consultation ; patient kept on lithium.    Today: complaining of right flank pain and urinary urgency ; urine urinalysis is negative in office. Chronic bronchitis symptoms continue. Patient reports having chills but not documented fever    past medical history, past surgical history, problem list, medications, and allergies are reviewed    OBJECTIVE: BP 146/76 mmHg  Pulse 88  Temp(Src) 97.6 F (36.4 C)  Ht 5' (1.524 m)  Wt 95 lb 9.6 oz (43.364 kg)  BMI 18.67 kg/m2  Pleasant white female in no acute distress  lungs: clear  back: no CVA tenderness  abdomen: soft,  no Peritoneal signs  Pelvic:  External genitalia- atrophic changes BUS-normal Vagina- moderate to severe atrophy  Cervix-surgically absent Bimanual exam- no palpable masses adnexa   RV-normal external exam    ASSESSMENT: 1. History of pneumonia, treated in December 2000 seen ; chronic bronchitis symptoms persist ; no subsequent follow-up  2. Right flank pain ; urinalysis normal  3. Recent diagnosis of depression, on  Lithium   PLAN: 1. Urine culture 2. CBC 3. PA and lateral chest x-ray 4. Pelvic ultrasound 5. Zithromax Z-Pak  6. Patient will be notified by phone of results and further management planning.   A total of 25 minutes were spent face-to-face with the patient during this encounter and over half of that time involved counseling and coordination of care.  Brayton Mars, MD  Note: This dictation was prepared with Dragon dictation along with smaller phrase technology. Any transcriptional errors that result from this  process are unintentional.

## 2016-03-30 NOTE — Patient Instructions (Signed)
1. CBC today 2. PA and lateral chest x-ray orders to assess for pneumonia resolution  3. Urinalysis and culture are obtained today 4. Pelvic ultrasound to assess for source of right flank pain  5. Zithromax Z-Pak is prescribed chronic bronchitis/ resolving pneumonia

## 2016-03-31 ENCOUNTER — Telehealth: Payer: Self-pay

## 2016-03-31 DIAGNOSIS — R9389 Abnormal findings on diagnostic imaging of other specified body structures: Secondary | ICD-10-CM

## 2016-03-31 DIAGNOSIS — R053 Chronic cough: Secondary | ICD-10-CM

## 2016-03-31 DIAGNOSIS — R05 Cough: Secondary | ICD-10-CM

## 2016-03-31 LAB — CBC WITH DIFFERENTIAL/PLATELET
BASOS: 0 %
Basophils Absolute: 0 10*3/uL (ref 0.0–0.2)
EOS (ABSOLUTE): 0.2 10*3/uL (ref 0.0–0.4)
Eos: 2 %
Hematocrit: 39.6 % (ref 34.0–46.6)
Hemoglobin: 13 g/dL (ref 11.1–15.9)
IMMATURE GRANULOCYTES: 0 %
Immature Grans (Abs): 0 10*3/uL (ref 0.0–0.1)
LYMPHS ABS: 1.1 10*3/uL (ref 0.7–3.1)
Lymphs: 16 %
MCH: 33.4 pg — AB (ref 26.6–33.0)
MCHC: 32.8 g/dL (ref 31.5–35.7)
MCV: 102 fL — AB (ref 79–97)
MONOS ABS: 0.5 10*3/uL (ref 0.1–0.9)
Monocytes: 7 %
NEUTROS ABS: 4.8 10*3/uL (ref 1.4–7.0)
NEUTROS PCT: 75 %
PLATELETS: 233 10*3/uL (ref 150–379)
RBC: 3.89 x10E6/uL (ref 3.77–5.28)
RDW: 13.2 % (ref 12.3–15.4)
WBC: 6.6 10*3/uL (ref 3.4–10.8)

## 2016-03-31 NOTE — Telephone Encounter (Signed)
Pt aware. Referral put in. Address and number of pulmonary given to pt.

## 2016-03-31 NOTE — Telephone Encounter (Signed)
-----   Message from Brayton Mars, MD sent at 03/31/2016  9:13 AM EDT ----- Please notify - Abnormal Labs Recommend referral to [place] [facility] for abnormal chest x-ray and chronic cough

## 2016-04-01 LAB — URINE CULTURE: Organism ID, Bacteria: NO GROWTH

## 2016-04-06 ENCOUNTER — Other Ambulatory Visit: Payer: Self-pay

## 2016-04-14 DIAGNOSIS — F3175 Bipolar disorder, in partial remission, most recent episode depressed: Secondary | ICD-10-CM | POA: Diagnosis not present

## 2016-04-15 ENCOUNTER — Ambulatory Visit (INDEPENDENT_AMBULATORY_CARE_PROVIDER_SITE_OTHER): Payer: Medicare Other

## 2016-04-15 DIAGNOSIS — R1031 Right lower quadrant pain: Secondary | ICD-10-CM

## 2016-04-27 DIAGNOSIS — H401123 Primary open-angle glaucoma, left eye, severe stage: Secondary | ICD-10-CM | POA: Diagnosis not present

## 2016-04-27 DIAGNOSIS — Z961 Presence of intraocular lens: Secondary | ICD-10-CM | POA: Diagnosis not present

## 2016-04-27 DIAGNOSIS — H2511 Age-related nuclear cataract, right eye: Secondary | ICD-10-CM | POA: Diagnosis not present

## 2016-04-27 DIAGNOSIS — H401112 Primary open-angle glaucoma, right eye, moderate stage: Secondary | ICD-10-CM | POA: Diagnosis not present

## 2016-04-28 DIAGNOSIS — J301 Allergic rhinitis due to pollen: Secondary | ICD-10-CM | POA: Diagnosis not present

## 2016-04-28 DIAGNOSIS — H6123 Impacted cerumen, bilateral: Secondary | ICD-10-CM | POA: Diagnosis not present

## 2016-05-05 ENCOUNTER — Institutional Professional Consult (permissible substitution): Payer: Medicare Other | Admitting: Internal Medicine

## 2016-05-12 DIAGNOSIS — F3175 Bipolar disorder, in partial remission, most recent episode depressed: Secondary | ICD-10-CM | POA: Diagnosis not present

## 2016-05-19 DIAGNOSIS — H40153 Residual stage of open-angle glaucoma, bilateral: Secondary | ICD-10-CM | POA: Diagnosis not present

## 2016-05-20 ENCOUNTER — Encounter: Payer: Self-pay | Admitting: Internal Medicine

## 2016-05-20 ENCOUNTER — Ambulatory Visit (INDEPENDENT_AMBULATORY_CARE_PROVIDER_SITE_OTHER): Payer: Medicare Other | Admitting: Internal Medicine

## 2016-05-20 VITALS — BP 122/68 | HR 91 | Ht 60.0 in | Wt 95.0 lb

## 2016-05-20 DIAGNOSIS — J189 Pneumonia, unspecified organism: Secondary | ICD-10-CM | POA: Diagnosis not present

## 2016-05-20 NOTE — Progress Notes (Signed)
[facility] Consultation      Date: 05/20/2016,   MRN# [id number] Sierra Benson [birth year] Code Status:  Code Status History    This patient does not have a recorded code status. Please follow your organizational policy for patients in this situation.     Hosp day:@LENGTHOFSTAYDAYS @ Referring MD: @ATDPROV @     PCP:      AdmissionWeight: 95 lb (43.092 kg)                 CurrentWeight: 95 lb (43.092 kg) Sierra Benson is a 82 y.o. old female seen in consultation for pneumonia at the request of Dr. Orlene Plum     CHIEF COMPLAINT:   cough   HISTORY OF PRESENT ILLNESS  82 yo white female seen today for abnormal CXR and CT chest 11/2015 pattern of chronic bronchiectasis in both lungs, most notable in the right upper lobe and left upper lobe but also with involvement extensively in the lingula. There are numerous pulmonary nodules, many showing tree in bud configuration. Many of these show internal calcifications.   I have discussed findings with patient, patient had productive cough with gray sputum for past several months and  was treated for pneumonia with z pack and levaquin. Patient continue to cough up gray sputum. 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 Patient does NOT take any inhalers at this time, denies wheezing    PAST MEDICAL HISTORY   Past Medical History  Diagnosis Date  . Vitamin D deficiency   . Mitral valve disorder   . Menopausal state   . Varicose veins   . Glaucoma   . Nocturia   . Rectocele   . Vaginal atrophy   . Cystocele   . Prediabetes 06/19/2015  . Bacterial pneumonia 11/2015  . Insomnia      SURGICAL HISTORY   Past Surgical History  Procedure Laterality Date  .  Foot surgery    . Vaginal hysterectomy      menorrhagia  . Eye surgery Left     cataract removed  . Breast biopsy Right     benign nodule     FAMILY HISTORY   Family History  Problem Relation Age of Onset  . Diabetes Brother   . Lung cancer Brother   . Breast cancer Sister   . Ovarian cancer Neg Hx   . Colon cancer Neg Hx   . Heart disease Neg Hx      SOCIAL HISTORY   Social History  Substance Use Topics  . Smoking status: Never Smoker   . Smokeless tobacco: None  . Alcohol Use: No     MEDICATIONS    Home Medication:  Current Outpatient Rx  Name  Route  Sig  Dispense  Refill  . bimatoprost (LUMIGAN) 0.01 % SOLN   Both Eyes   Place 1 drop into both eyes at bedtime.          Marland Kitchen lithium 300 MG tablet   Oral   Take 1 tablet (300 mg total) by mouth at bedtime.   30 tablet   2   . magnesium oxide (MAG-OX) 400 MG tablet   Oral   Take 400 mg by mouth daily.         Marland Kitchen  Multiple Vitamins-Minerals (MULTIVITAMIN ADULT PO)   Oral   Take 1 tablet by mouth daily.          . timolol (TIMOPTIC) 0.5 % ophthalmic solution   Ophthalmic   Apply to eye.           Current Medication:  Current outpatient prescriptions:  .  bimatoprost (LUMIGAN) 0.01 % SOLN, Place 1 drop into both eyes at bedtime. , Disp: , Rfl:  .  lithium 300 MG tablet, Take 1 tablet (300 mg total) by mouth at bedtime., Disp: 30 tablet, Rfl: 2 .  magnesium oxide (MAG-OX) 400 MG tablet, Take 400 mg by mouth daily., Disp: , Rfl:  .  Multiple Vitamins-Minerals (MULTIVITAMIN ADULT PO), Take 1 tablet by mouth daily. , Disp: , Rfl:  .  timolol (TIMOPTIC) 0.5 % ophthalmic solution, Apply to eye., Disp: , Rfl:     ALLERGIES   Propoxyphene; Celecoxib; Ibuprofen; and Prednisone     REVIEW OF SYSTEMS   Review of Systems  Constitutional: Negative for fever, chills, weight loss and malaise/fatigue.  HENT: Negative for congestion and hearing loss.   Eyes: Negative for blurred vision and double  vision.  Respiratory: Positive for cough and sputum production. Negative for hemoptysis, shortness of breath and wheezing.   Cardiovascular: Negative for chest pain, palpitations, orthopnea and leg swelling.  Gastrointestinal: Negative for heartburn, nausea, vomiting and abdominal pain.  Genitourinary: Negative for dysuria.  Musculoskeletal: Negative for myalgias.  Skin: Negative for rash.  Neurological: Negative for dizziness and headaches.  Endo/Heme/Allergies: Does not bruise/bleed easily.  Psychiatric/Behavioral: The patient is not nervous/anxious.   All other systems reviewed and are negative.    VS: BP 122/68 mmHg  Pulse 91  Ht 5' (1.524 m)  Wt 95 lb (43.092 kg)  BMI 18.55 kg/m2  SpO2 95%     PHYSICAL EXAM  Physical Exam  Constitutional: She is oriented to person, place, and time. She appears well-developed and well-nourished. No distress.  HENT:  Head: Normocephalic and atraumatic.  Mouth/Throat: No oropharyngeal exudate.  Eyes: EOM are normal. Pupils are equal, round, and reactive to light. No scleral icterus.  Neck: Normal range of motion. Neck supple.  Cardiovascular: Normal rate, regular rhythm and normal heart sounds.   No murmur heard. Pulmonary/Chest: No stridor. No respiratory distress. She has no wheezes. She has rales.  Abdominal: Soft. Bowel sounds are normal. She exhibits no distension. There is no tenderness. There is no rebound.  Musculoskeletal: Normal range of motion. She exhibits no edema.  Neurological: She is alert and oriented to person, place, and time. No cranial nerve deficit.  Skin: Skin is warm. She is not diaphoretic.  Psychiatric: She has a normal mood and affect.    Ct chest 11/2015 imiages reviewed 05/20/2016         ASSESSMENT/PLAN   82 yo white female seen today for abnormal CT chest and CXR and after further assessment and reviews of imaging studies,  patient has chronic bronchiectasis with chronic sputum production with  previous bout of atypical pneumonia   I recommend CT chest to assess interval changes I have also explained that she may need Bronchoscopy to obtain sputum samples-she is very reluctant to have any procedures at this time I have explained to her that we will re-assess procedure after obtaining CT chest  I have personally obtained a history, examined the patient, evaluated laboratory and independently reviewed imaging results, formulated the assessment and plan and placed orders.  The Patient requires high complexity decision  making for assessment and support, frequent evaluation and titration of therapies, application of advanced monitoring technologies and extensive interpretation of multiple databases.   Patient  satisfied with Plan of action and management. All questions answered  Corrin Parker, M.D.  [facility]  Medical Director [place] Director [facility] Cardio-Pulmonary Department

## 2016-05-20 NOTE — Patient Instructions (Signed)

## 2016-06-03 DIAGNOSIS — H40153 Residual stage of open-angle glaucoma, bilateral: Secondary | ICD-10-CM | POA: Diagnosis not present

## 2016-06-07 ENCOUNTER — Telehealth: Payer: Self-pay | Admitting: Internal Medicine

## 2016-06-07 ENCOUNTER — Ambulatory Visit: Payer: Medicare Other

## 2016-06-07 ENCOUNTER — Ambulatory Visit
Admission: RE | Admit: 2016-06-07 | Discharge: 2016-06-07 | Disposition: A | Discharge status: Home / Self Care | Payer: Medicare Other | Source: Ambulatory Visit | Attending: Internal Medicine | Admitting: Internal Medicine

## 2016-06-07 DIAGNOSIS — J189 Pneumonia, unspecified organism: Secondary | ICD-10-CM | POA: Diagnosis not present

## 2016-06-07 DIAGNOSIS — R938 Abnormal findings on diagnostic imaging of other specified body structures: Secondary | ICD-10-CM | POA: Diagnosis not present

## 2016-06-07 NOTE — Telephone Encounter (Signed)
Pt calling asking if she can reschedule her CT scan today  Please call back.

## 2016-06-07 NOTE — Telephone Encounter (Signed)
Called and spoke with patient and she was trying to decide if she needed to R/S or have the CT done today.  Pt has appointments at [facility] that she has to work around. Pt decided to go ahead and keep the appointment for the CT Chest today at [facility]. Nothing else needed at this time. Rhonda J Cobb

## 2016-06-16 DIAGNOSIS — F3175 Bipolar disorder, in partial remission, most recent episode depressed: Secondary | ICD-10-CM | POA: Diagnosis not present

## 2016-06-17 ENCOUNTER — Ambulatory Visit: Payer: Medicare Other

## 2016-06-24 ENCOUNTER — Ambulatory Visit (INDEPENDENT_AMBULATORY_CARE_PROVIDER_SITE_OTHER): Payer: Medicare Other | Admitting: Internal Medicine

## 2016-06-24 ENCOUNTER — Encounter: Payer: Self-pay | Admitting: Internal Medicine

## 2016-06-24 VITALS — BP 132/88 | HR 68 | Wt 94.0 lb

## 2016-06-24 DIAGNOSIS — J9809 Other diseases of bronchus, not elsewhere classified: Secondary | ICD-10-CM | POA: Diagnosis not present

## 2016-06-24 DIAGNOSIS — T17500A Unspecified foreign body in bronchus causing asphyxiation, initial encounter: Secondary | ICD-10-CM

## 2016-06-24 DIAGNOSIS — J189 Pneumonia, unspecified organism: Secondary | ICD-10-CM

## 2016-06-24 MED ORDER — FLUTTER DEVI: Status: DC

## 2016-06-24 NOTE — Progress Notes (Signed)
[facility] Consultation      Date: 06/24/2016,   MRN# [id number] Sierra Benson 30-Jun-1929 Code Status:  Code Status History    This patient does not have a recorded code status. Please follow your organizational policy for patients in this situation.     Hosp day:@LENGTHOFSTAYDAYS @ Referring MD: @ATDPROV @     PCP:      AdmissionWeight: 94 lb (42.638 kg)                 CurrentWeight: 94 lb (42.638 kg) Sierra Benson is a 82 y.o. old female seen in consultation for pneumonia at the request of Dr. Orlene Plum     CHIEF COMPLAINT:   Chronic cough   HISTORY OF PRESENT ILLNESS  82 yo white female seen today for abnormal CXR and CT chest 11/2015 pattern of chronic bronchiectasis in both lungs, most notable in the right upper lobe and left upper lobe but also with involvement extensively in the lingula. There are numerous pulmonary nodules, many showing tree in bud configuration. Many of these show internal calcifications.   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 Patient does NOT take any inhalers at this time, denies wheezing      Current Medication:  Current outpatient prescriptions:  .  bimatoprost (LUMIGAN) 0.01 % SOLN, Place 1 drop into both eyes at bedtime. , Disp: , Rfl:  .  lithium 300 MG tablet, Take 1 tablet (300 mg total)  by mouth at bedtime., Disp: 30 tablet, Rfl: 2 .  magnesium oxide (MAG-OX) 400 MG tablet, Take 400 mg by mouth daily., Disp: , Rfl:  .  Multiple Vitamins-Minerals (MULTIVITAMIN ADULT PO), Take 1 tablet by mouth daily. , Disp: , Rfl:  .  timolol (TIMOPTIC) 0.5 % ophthalmic solution, Apply to eye., Disp: , Rfl:     ALLERGIES   Propoxyphene; Celecoxib; Ibuprofen; and Prednisone     REVIEW OF SYSTEMS   Review of Systems  Constitutional: Negative for fever, chills, weight loss and malaise/fatigue.  HENT: Negative for congestion and hearing loss.   Eyes: Negative for blurred vision and double vision.  Respiratory: Positive for cough and sputum production. Negative for hemoptysis, shortness of breath and wheezing.   Cardiovascular: Negative for chest pain, palpitations, orthopnea and leg swelling.  Skin: Negative for rash.  Neurological: Negative for headaches.  All other systems reviewed and are negative.    VS: BP 132/88 mmHg  Pulse 68  Wt 94 lb (42.638 kg)  SpO2 98%     PHYSICAL EXAM  Physical Exam  Constitutional: She is oriented to person, place, and time. No distress.  HENT:  Mouth/Throat: No oropharyngeal exudate.  Cardiovascular: Normal rate, regular rhythm and normal heart sounds.   No murmur heard. Pulmonary/Chest: Effort normal and breath sounds normal. No stridor. No respiratory distress. She has no wheezes. She has no  rales.  Musculoskeletal: Normal range of motion. She exhibits no edema.  Neurological: She is alert and oriented to person, place, and time.  Skin: Skin is warm. She is not diaphoretic.  Psychiatric: She has a normal mood and affect.    Ct chest 06/07/16 images reviewed 06/24/2016         ASSESSMENT/PLAN   82 yo white female seen today for abnormal CT chest and CXR and after further assessment and reviews of imaging studies,  patient has chronic bronchiectasis with chronic sputum production with previous bout of atypical pneumonia with what  appears to be mucoid impaction  Patient REFUSES BRONCHOSCOPY AT THIS TIME and WANTS TO REPEAT CT CHEST IN NEXT 8 WEEKS   CT chest to assess interval changes in 8 weeks Recommend Flutter Valve 6-8 times per day Recommend Sptum culture sampling if able-AFB, fungal and gram stain.    I have personally obtained a history, examined the patient, evaluated laboratory and independently reviewed imaging results, formulated the assessment and plan and placed orders.  The Patient requires high complexity decision making for assessment and support, frequent evaluation and titration of therapies, application of advanced monitoring technologies and extensive interpretation of multiple databases.   Patient  satisfied with Plan of action and management. All questions answered  Corrin Parker, M.D.  [facility]  Medical Director [place] Director [facility] Cardio-Pulmonary Department

## 2016-06-24 NOTE — Patient Instructions (Signed)
1.use flutter valve 2.follow up CT chest in 1 month  Chronic Bronchitis Chronic bronchitis is a lasting inflammation of the bronchial tubes, which are the tubes that carry air into your lungs. This is inflammation that occurs:   On most days of the week.   For at least three months at a time.   Over a period of two years in a row. When the bronchial tubes are inflamed, they start to produce mucus. The inflammation and buildup of mucus make it more difficult to breathe. Chronic bronchitis is usually a permanent problem and is one type of chronic obstructive pulmonary disease (COPD). People with chronic bronchitis are at greater risk for getting repeated colds, or respiratory infections. CAUSES  Chronic bronchitis most often occurs in people who have:  Long-standing, severe asthma.  A history of smoking.  Asthma and who also smoke. SIGNS AND SYMPTOMS  Chronic bronchitis may cause the following:   A cough that brings up mucus (productive cough).  Shortness of breath.  Early morning headache.  Wheezing.  Chest discomfort.   Recurring respiratory infections. DIAGNOSIS  Your health care provider may confirm the diagnosis by:  Taking your medical history.  Performing a physical exam.  Taking a chest X-ray.   Performing pulmonary function tests. TREATMENT  Treatment involves controlling symptoms with medicines, oxygen therapy, or making lifestyle changes, such as exercising and eating a healthy, well-balanced diet. Medicines could include:  Inhalers to improve air flow in and out of your lungs.  Antibiotics to treat bacterial infections, such as pneumonia, sinus infections, and acute bronchitis. As a preventative measure, your health care provider may recommend routine vaccinations for influenza and pneumonia. This is to prevent infection and hospitalization since you may be more at risk for these types of infections.  HOME CARE INSTRUCTIONS  Take medicines only as  directed by your health care provider.   If you smoke cigarettes, chew tobacco, or use electronic cigarettes, quit. If you need help quitting, ask your health care provider.  Avoid pollen, dust, animal dander, molds, smoke, and other things that cause shortness of breath or wheezing attacks.  Talk to your health care provider about possible exercise routines. Regular exercise is very important to help you feel better.  If you are prescribed oxygen use at home follow these guidelines:  Never smoke while using oxygen. Oxygen does not burn or explode, but flammable materials will burn faster in the presence of oxygen.  Keep a Data processing manager close by. Let your fire department know that you have oxygen in your home.  Warn visitors not to smoke near you when you are using oxygen. Put up "no smoking" signs in your home where you most often use the oxygen.  Regularly test your smoke detectors at home to make sure they work. If you receive care in your home from a nurse or other health care provider, he or she may also check to make sure your smoke detectors work.  Ask your health care provider whether you would benefit from a pulmonary rehabilitation program.  Do not wait to get medical care if you have any concerning symptoms. Delays could cause permanent injury and may be life threatening. SEEK MEDICAL CARE IF:  You have increased coughing or shortness of breath or both.  You have muscle aches.  You have chest pain.  Your mucus gets thicker.  Your mucus changes from clear or white to yellow, green, gray, or bloody. SEEK IMMEDIATE MEDICAL CARE IF:  Your usual medicines do  not stop your wheezing.   You have increased difficulty breathing.   You have any problems with the medicine you are taking, such as a rash, itching, swelling, or trouble breathing. MAKE SURE YOU:   Understand these instructions.  Will watch your condition.  Will get help right away if you are not doing  well or get worse.   This information is not intended to replace advice given to you by your health care provider. Make sure you discuss any questions you have with your health care provider.   Document Released: 09/23/2006 Document Revised: 12/27/2014 Document Reviewed: 01/14/2014 Elsevier Interactive Patient Education 2016 [employer].  Bronchiectasis Bronchiectasis is a condition in which the airways (bronchi) are damaged and widened. This makes it difficult for the lungs to get rid of mucus. As a result, mucus gathers in the airways, and this often leads to lung infections. Infection can cause inflammation in the airways, which may further weaken and damage the bronchi.  CAUSES  Bronchiectasis may be present at birth (congenital) or may develop later in life. Sometimes there is no apparent cause. Some common causes include:  Cystic fibrosis.   Recurrent lung infections (such as pneumonia, tuberculosis, or fungal infections).  Foreign bodies or other blockages in the lungs.  Breathing in fluid, food, or other foreign objects (aspiration). SIGNS AND SYMPTOMS  Common symptoms include:  A daily cough that brings up mucus and lasts for more than 3 weeks.  Frequent lung infections (such as pneumonia, tuberculosis, or fungal infections).  Shortness of breath and wheezing.   Weakness and fatigue. DIAGNOSIS  Various tests may be done to help diagnose bronchiectasis. Tests may include:  Chest X-rays or CT scans.   Breathing tests to help determine how your lungs are working.   Sputum cultures to check for infection.   Blood tests and other tests to check for related diseases or causes, such as cystic fibrosis. TREATMENT  Treatment varies depending on the severity of the condition. Medicines may be given to loosen the mucus to be coughed up (expectorants), to relax the muscles of the air passages (bronchodilators), or to prevent or treat infections (antibiotics). Physical  therapy methods may be recommended to help clear mucus from the lungs. For severe cases, surgery may be done to remove the affected part of the lung. HOME CARE INSTRUCTIONS   Get plenty of rest.   Only take over-the-counter or prescription medicines as directed by your health care provider. If antibiotic medicines were prescribed, take them as directed. Finish them even if you start to feel better.  Avoid sedatives and antihistamines unless otherwise directed by your health care provider. These medicines tend to thicken the mucus in the lungs.   Perform any breathing exercises or techniques to clear the lungs as directed by your health care provider.  Drink enough fluids to keep your urine clear or pale yellow.  Consider using a cold steam vaporizer or humidifier in your room or home to help loosen secretions.   If the cough is worse at night, try sleeping in a semi-upright position in a recliner or using a couple of pillows.   Avoid cigarette smoke and lung irritants. If you smoke, quit.  Stay inside when pollution and ozone levels are high.   Stay current with vaccinations and immunizations.   Follow up with your health care provider as directed.  SEEK MEDICAL CARE IF:  You cough up more thick, discolored mucus (sputum) that is yellow to green in color.  You  have a fever or persistent symptoms for more than 2-3 days.  You cannot control your cough and are losing sleep. SEEK IMMEDIATE MEDICAL CARE IF:   You cough up blood.   You have chest pain or increasing shortness of breath.   You have pain that is getting worse or is uncontrolled with medicines.   You have a fever and your symptoms suddenly get worse. MAKE SURE YOU:  Understand these instructions.   Will watch your condition.   Will get help right away if you are not doing well or get worse.    This information is not intended to replace advice given to you by your health care provider. Make sure  you discuss any questions you have with your health care provider.   Document Released: 10/03/2007 Document Revised: 12/11/2013 Document Reviewed: 06/13/2013 Elsevier Interactive Patient Education [employer].

## 2016-06-25 DIAGNOSIS — H40153 Residual stage of open-angle glaucoma, bilateral: Secondary | ICD-10-CM | POA: Diagnosis not present

## 2016-06-29 DIAGNOSIS — H40153 Residual stage of open-angle glaucoma, bilateral: Secondary | ICD-10-CM | POA: Diagnosis not present

## 2016-06-30 DIAGNOSIS — M25572 Pain in left ankle and joints of left foot: Secondary | ICD-10-CM | POA: Diagnosis not present

## 2016-06-30 DIAGNOSIS — M7741 Metatarsalgia, right foot: Secondary | ICD-10-CM | POA: Diagnosis not present

## 2016-06-30 DIAGNOSIS — M7752 Other enthesopathy of left foot: Secondary | ICD-10-CM | POA: Diagnosis not present

## 2016-06-30 DIAGNOSIS — M7742 Metatarsalgia, left foot: Secondary | ICD-10-CM | POA: Diagnosis not present

## 2016-06-30 DIAGNOSIS — M19072 Primary osteoarthritis, left ankle and foot: Secondary | ICD-10-CM | POA: Diagnosis not present

## 2016-06-30 DIAGNOSIS — M25571 Pain in right ankle and joints of right foot: Secondary | ICD-10-CM | POA: Diagnosis not present

## 2016-06-30 DIAGNOSIS — M19071 Primary osteoarthritis, right ankle and foot: Secondary | ICD-10-CM | POA: Diagnosis not present

## 2016-06-30 DIAGNOSIS — M7751 Other enthesopathy of right foot: Secondary | ICD-10-CM | POA: Diagnosis not present

## 2016-07-15 DIAGNOSIS — R142 Eructation: Secondary | ICD-10-CM | POA: Diagnosis not present

## 2016-07-15 DIAGNOSIS — Z87898 Personal history of other specified conditions: Secondary | ICD-10-CM | POA: Diagnosis not present

## 2016-08-11 DIAGNOSIS — M4185 Other forms of scoliosis, thoracolumbar region: Secondary | ICD-10-CM | POA: Diagnosis not present

## 2016-08-11 DIAGNOSIS — M549 Dorsalgia, unspecified: Secondary | ICD-10-CM | POA: Diagnosis not present

## 2016-08-20 ENCOUNTER — Ambulatory Visit: Payer: Medicare Other

## 2016-08-24 ENCOUNTER — Ambulatory Visit: Payer: Medicare Other | Admitting: Internal Medicine

## 2016-08-24 DIAGNOSIS — H401123 Primary open-angle glaucoma, left eye, severe stage: Secondary | ICD-10-CM | POA: Diagnosis not present

## 2016-08-24 DIAGNOSIS — Z961 Presence of intraocular lens: Secondary | ICD-10-CM | POA: Diagnosis not present

## 2016-08-24 DIAGNOSIS — H401112 Primary open-angle glaucoma, right eye, moderate stage: Secondary | ICD-10-CM | POA: Diagnosis not present

## 2016-08-30 DIAGNOSIS — M546 Pain in thoracic spine: Secondary | ICD-10-CM | POA: Diagnosis not present

## 2016-09-07 DIAGNOSIS — F3175 Bipolar disorder, in partial remission, most recent episode depressed: Secondary | ICD-10-CM | POA: Diagnosis not present

## 2016-09-07 DIAGNOSIS — R636 Underweight: Secondary | ICD-10-CM | POA: Diagnosis not present

## 2016-09-09 DIAGNOSIS — M546 Pain in thoracic spine: Secondary | ICD-10-CM | POA: Diagnosis not present

## 2016-09-13 DIAGNOSIS — M546 Pain in thoracic spine: Secondary | ICD-10-CM | POA: Diagnosis not present

## 2016-09-20 DIAGNOSIS — M546 Pain in thoracic spine: Secondary | ICD-10-CM | POA: Diagnosis not present

## 2016-09-23 DIAGNOSIS — M546 Pain in thoracic spine: Secondary | ICD-10-CM | POA: Diagnosis not present

## 2016-09-27 DIAGNOSIS — M546 Pain in thoracic spine: Secondary | ICD-10-CM | POA: Diagnosis not present

## 2016-09-29 DIAGNOSIS — D2272 Melanocytic nevi of left lower limb, including hip: Secondary | ICD-10-CM | POA: Diagnosis not present

## 2016-09-29 DIAGNOSIS — D2271 Melanocytic nevi of right lower limb, including hip: Secondary | ICD-10-CM | POA: Diagnosis not present

## 2016-09-29 DIAGNOSIS — L570 Actinic keratosis: Secondary | ICD-10-CM | POA: Diagnosis not present

## 2016-09-29 DIAGNOSIS — D2261 Melanocytic nevi of right upper limb, including shoulder: Secondary | ICD-10-CM | POA: Diagnosis not present

## 2016-10-07 DIAGNOSIS — F3175 Bipolar disorder, in partial remission, most recent episode depressed: Secondary | ICD-10-CM | POA: Diagnosis not present

## 2016-10-07 DIAGNOSIS — Z5181 Encounter for therapeutic drug level monitoring: Secondary | ICD-10-CM | POA: Diagnosis not present

## 2016-10-07 DIAGNOSIS — R5383 Other fatigue: Secondary | ICD-10-CM | POA: Diagnosis not present

## 2016-10-07 DIAGNOSIS — R636 Underweight: Secondary | ICD-10-CM | POA: Diagnosis not present

## 2016-10-18 DIAGNOSIS — J069 Acute upper respiratory infection, unspecified: Secondary | ICD-10-CM | POA: Diagnosis not present

## 2016-10-18 DIAGNOSIS — H6123 Impacted cerumen, bilateral: Secondary | ICD-10-CM | POA: Diagnosis not present

## 2016-10-18 DIAGNOSIS — J301 Allergic rhinitis due to pollen: Secondary | ICD-10-CM | POA: Diagnosis not present

## 2016-10-27 DIAGNOSIS — M25571 Pain in right ankle and joints of right foot: Secondary | ICD-10-CM | POA: Diagnosis not present

## 2016-10-27 DIAGNOSIS — M19072 Primary osteoarthritis, left ankle and foot: Secondary | ICD-10-CM | POA: Diagnosis not present

## 2016-10-27 DIAGNOSIS — L565 Disseminated superficial actinic porokeratosis (DSAP): Secondary | ICD-10-CM | POA: Diagnosis not present

## 2016-10-27 DIAGNOSIS — M19071 Primary osteoarthritis, right ankle and foot: Secondary | ICD-10-CM | POA: Diagnosis not present

## 2016-10-27 DIAGNOSIS — M25572 Pain in left ankle and joints of left foot: Secondary | ICD-10-CM | POA: Diagnosis not present

## 2016-10-28 DIAGNOSIS — H40153 Residual stage of open-angle glaucoma, bilateral: Secondary | ICD-10-CM | POA: Diagnosis not present

## 2016-11-03 ENCOUNTER — Ambulatory Visit (INDEPENDENT_AMBULATORY_CARE_PROVIDER_SITE_OTHER): Payer: Medicare Other | Admitting: Obstetrics and Gynecology

## 2016-11-03 ENCOUNTER — Encounter: Payer: Self-pay | Admitting: Obstetrics and Gynecology

## 2016-11-03 VITALS — BP 151/79 | HR 83 | Ht 60.0 in | Wt 91.1 lb

## 2016-11-03 DIAGNOSIS — R05 Cough: Secondary | ICD-10-CM | POA: Diagnosis not present

## 2016-11-03 DIAGNOSIS — R309 Painful micturition, unspecified: Secondary | ICD-10-CM | POA: Diagnosis not present

## 2016-11-03 DIAGNOSIS — R053 Chronic cough: Secondary | ICD-10-CM

## 2016-11-03 DIAGNOSIS — R109 Unspecified abdominal pain: Secondary | ICD-10-CM

## 2016-11-03 DIAGNOSIS — J471 Bronchiectasis with (acute) exacerbation: Secondary | ICD-10-CM | POA: Diagnosis not present

## 2016-11-03 LAB — POCT URINALYSIS DIPSTICK
Bilirubin, UA: NEGATIVE
GLUCOSE UA: NEGATIVE
Ketones, UA: NEGATIVE
Leukocytes, UA: NEGATIVE
Nitrite, UA: NEGATIVE
PH UA: 6
RBC UA: NEGATIVE
SPEC GRAV UA: 1.025
UROBILINOGEN UA: NEGATIVE

## 2016-11-03 MED ORDER — AZITHROMYCIN 250 MG PO TABS: ORAL_TABLET | ORAL | 1 refills | Status: DC

## 2016-11-03 NOTE — Progress Notes (Signed)
Chief complaint: 1. Left flank plain 2. Chronic cough with gray sputum production 3. History of abnormal CT of chest and chest x-ray-chronic bronchiectasis with episode of atypical pneumonia and mucoid impaction (per pulmonary note)  Sierra Benson presents today with complaints of lower back pain and chronic cough producing grayish white. She denies fevers but has had occasional chill. Patient is also experiencing some mild stress incontinence with coughing. She uses a paper towel occasionally to prevent soiling. She does not experience any dysuria urgency or frequency. Urinalysis in the office today is negative. Urine culture is sent.  Past medical history, past surgical history, problem list, medications, and allergies are reviewed  OBJECTIVE: BP (!) 151/79   Pulse 83   Ht 5' (1.524 m)   Wt 91 lb 1.6 oz (41.3 kg)   BMI 17.79 kg/m  Pleasant elderly female in no acute distress Back: Mild left CVA tenderness Lungs: Respiratory rate normal; lungs clear; episode of coughing notable for upper airway congestion Abdomen: Soft, nontender; no bladder tenderness Pelvic: External genitalia-normal BUS-atrophic changes; urethral caruncle Vagina-moderate to severe atrophy; good vault support Bimanual-no palpable masses or tenderness; no anterior vaginal wall tenderness  ASSESSMENT: 1. No evidence of UTI or pyelonephritis 2. Left flank pain 3. Chronic cough with gray sputum production 4. History of chronic bronchiectasis with episode of atypical pneumonia and mucoid impaction in past, treated with Zithromax and Levaquin  PLAN: 1. PA lateral chest x-ray 2. CBC 3. Zithromax Z-Pak 4. Consider follow-up with Dr. Mortimer Fries of pulmonary medicine for further management planning  A total of 25 minutes were spent face-to-face with the patient during this encounter and over half of that time involved counseling and coordination of care.  Brayton Mars, MD  Note: This dictation was prepared with Dragon  dictation along with smaller phrase technology. Any transcriptional errors that result from this process are unintentional.

## 2016-11-03 NOTE — Patient Instructions (Signed)
1. PA lateral chest x-ray is ordered 2. CBC is obtained 3. Zithromax Z-Pak is prescribed 4. If symptoms persist or if any abnormalities on laboratory testing, patient will follow up with Dr. Patricia Pesa of pulmonary medicine

## 2016-11-04 LAB — CBC
HEMOGLOBIN: 13.1 g/dL (ref 11.1–15.9)
Hematocrit: 40.3 % (ref 34.0–46.6)
MCH: 32.6 pg (ref 26.6–33.0)
MCHC: 32.5 g/dL (ref 31.5–35.7)
MCV: 100 fL — ABNORMAL HIGH (ref 79–97)
PLATELETS: 349 10*3/uL (ref 150–379)
RBC: 4.02 x10E6/uL (ref 3.77–5.28)
RDW: 13.3 % (ref 12.3–15.4)
WBC: 8.8 10*3/uL (ref 3.4–10.8)

## 2016-11-05 LAB — URINE CULTURE: Organism ID, Bacteria: NO GROWTH

## 2016-11-18 ENCOUNTER — Telehealth: Payer: Self-pay | Admitting: Obstetrics and Gynecology

## 2016-11-18 NOTE — Telephone Encounter (Signed)
Pt aware cbc and urine culture all neg.

## 2016-11-18 NOTE — Telephone Encounter (Signed)
PT CALLED AND WANTED TO KNOW THE RESULTS OF HER BLOOD WORK.

## 2016-11-18 NOTE — Telephone Encounter (Signed)
lmtrc

## 2016-11-24 ENCOUNTER — Encounter: Payer: Self-pay | Admitting: Obstetrics and Gynecology

## 2016-11-25 ENCOUNTER — Other Ambulatory Visit: Payer: Self-pay | Admitting: Neurology

## 2016-11-25 DIAGNOSIS — M419 Scoliosis, unspecified: Secondary | ICD-10-CM | POA: Diagnosis not present

## 2016-11-25 DIAGNOSIS — R413 Other amnesia: Secondary | ICD-10-CM

## 2016-11-25 DIAGNOSIS — E559 Vitamin D deficiency, unspecified: Secondary | ICD-10-CM | POA: Diagnosis not present

## 2016-11-30 ENCOUNTER — Ambulatory Visit: Payer: Medicare Other

## 2016-11-30 DIAGNOSIS — M7751 Other enthesopathy of right foot: Secondary | ICD-10-CM | POA: Diagnosis not present

## 2016-11-30 DIAGNOSIS — M216X1 Other acquired deformities of right foot: Secondary | ICD-10-CM | POA: Diagnosis not present

## 2016-12-01 DIAGNOSIS — M25571 Pain in right ankle and joints of right foot: Secondary | ICD-10-CM | POA: Diagnosis not present

## 2016-12-01 DIAGNOSIS — M19071 Primary osteoarthritis, right ankle and foot: Secondary | ICD-10-CM | POA: Diagnosis not present

## 2016-12-01 DIAGNOSIS — L565 Disseminated superficial actinic porokeratosis (DSAP): Secondary | ICD-10-CM | POA: Diagnosis not present

## 2016-12-07 DIAGNOSIS — D485 Neoplasm of uncertain behavior of skin: Secondary | ICD-10-CM | POA: Diagnosis not present

## 2016-12-07 DIAGNOSIS — C44729 Squamous cell carcinoma of skin of left lower limb, including hip: Secondary | ICD-10-CM | POA: Diagnosis not present

## 2016-12-15 ENCOUNTER — Ambulatory Visit
Admission: RE | Admit: 2016-12-15 | Discharge: 2016-12-15 | Disposition: A | Discharge status: Home / Self Care | Payer: Medicare Other | Source: Ambulatory Visit | Attending: Neurology | Admitting: Neurology

## 2016-12-15 DIAGNOSIS — R413 Other amnesia: Secondary | ICD-10-CM | POA: Insufficient documentation

## 2017-01-09 DIAGNOSIS — M79661 Pain in right lower leg: Secondary | ICD-10-CM | POA: Diagnosis not present

## 2017-01-10 ENCOUNTER — Ambulatory Visit
Admission: RE | Admit: 2017-01-10 | Discharge: 2017-01-10 | Disposition: A | Discharge status: Home / Self Care | Payer: Medicare Other | Source: Ambulatory Visit | Attending: Counselor | Admitting: Counselor

## 2017-01-10 ENCOUNTER — Other Ambulatory Visit: Payer: Self-pay | Admitting: Counselor

## 2017-01-10 DIAGNOSIS — I82811 Embolism and thrombosis of superficial veins of right lower extremities: Secondary | ICD-10-CM | POA: Diagnosis not present

## 2017-01-10 DIAGNOSIS — M79661 Pain in right lower leg: Secondary | ICD-10-CM

## 2017-01-18 DIAGNOSIS — Z87898 Personal history of other specified conditions: Secondary | ICD-10-CM | POA: Diagnosis not present

## 2017-01-18 DIAGNOSIS — R0602 Shortness of breath: Secondary | ICD-10-CM | POA: Diagnosis not present

## 2017-01-19 DIAGNOSIS — H6501 Acute serous otitis media, right ear: Secondary | ICD-10-CM | POA: Diagnosis not present

## 2017-01-19 DIAGNOSIS — H6981 Other specified disorders of Eustachian tube, right ear: Secondary | ICD-10-CM | POA: Diagnosis not present

## 2017-01-19 DIAGNOSIS — J019 Acute sinusitis, unspecified: Secondary | ICD-10-CM | POA: Diagnosis not present

## 2017-01-21 DIAGNOSIS — L905 Scar conditions and fibrosis of skin: Secondary | ICD-10-CM | POA: Diagnosis not present

## 2017-01-21 DIAGNOSIS — C44729 Squamous cell carcinoma of skin of left lower limb, including hip: Secondary | ICD-10-CM | POA: Diagnosis not present

## 2017-02-09 DIAGNOSIS — R413 Other amnesia: Secondary | ICD-10-CM | POA: Diagnosis not present

## 2017-02-09 DIAGNOSIS — M419 Scoliosis, unspecified: Secondary | ICD-10-CM | POA: Diagnosis not present

## 2017-02-16 DIAGNOSIS — R0602 Shortness of breath: Secondary | ICD-10-CM | POA: Diagnosis not present

## 2017-02-24 DIAGNOSIS — F3175 Bipolar disorder, in partial remission, most recent episode depressed: Secondary | ICD-10-CM | POA: Diagnosis not present

## 2017-02-24 DIAGNOSIS — Z5181 Encounter for therapeutic drug level monitoring: Secondary | ICD-10-CM | POA: Diagnosis not present

## 2017-02-24 DIAGNOSIS — R5383 Other fatigue: Secondary | ICD-10-CM | POA: Diagnosis not present

## 2017-03-03 ENCOUNTER — Ambulatory Visit
Admission: RE | Admit: 2017-03-03 | Discharge: 2017-03-03 | Disposition: A | Discharge status: Home / Self Care | Payer: Medicare Other | Source: Ambulatory Visit | Attending: Obstetrics and Gynecology | Admitting: Obstetrics and Gynecology

## 2017-03-03 DIAGNOSIS — H40153 Residual stage of open-angle glaucoma, bilateral: Secondary | ICD-10-CM | POA: Diagnosis not present

## 2017-03-03 DIAGNOSIS — R053 Chronic cough: Secondary | ICD-10-CM

## 2017-03-03 DIAGNOSIS — R109 Unspecified abdominal pain: Secondary | ICD-10-CM | POA: Diagnosis not present

## 2017-03-03 DIAGNOSIS — R05 Cough: Secondary | ICD-10-CM | POA: Insufficient documentation

## 2017-03-03 DIAGNOSIS — I517 Cardiomegaly: Secondary | ICD-10-CM | POA: Diagnosis not present

## 2017-03-03 DIAGNOSIS — R918 Other nonspecific abnormal finding of lung field: Secondary | ICD-10-CM | POA: Diagnosis not present

## 2017-03-04 ENCOUNTER — Telehealth: Payer: Self-pay | Admitting: Obstetrics and Gynecology

## 2017-03-04 NOTE — Telephone Encounter (Signed)
Patient called to get results of CXR. Please advise

## 2017-03-04 NOTE — Telephone Encounter (Signed)
Pt aware per mad chest x ray shows worsening pneumonia. Has see Dr Mortimer Fries in the past. Per mad she is to f/u with him. Gave pt number and address to [facility]. Advised to contact asap. Pt voices understanding.

## 2017-03-18 IMAGING — US US EXTREM LOW VENOUS*R*
1 series · 13 of 24 positions shown · non-contrast
Comparison: None.

CLINICAL DATA: Three-day history of right lower extremity pain and
edema

EXAM:
RIGHT LOWER EXTREMITY VENOUS DUPLEX ULTRASOUND
TECHNIQUE: Gray-scale sonography with graded compression, as well as color
Doppler and duplex ultrasound were performed to evaluate the right
lower extremity deep venous system from the level of the common
femoral vein and including the common femoral, femoral, profunda
femoral, popliteal and calf veins including the posterior tibial,
peroneal and gastrocnemius veins when visible. The superficial great
saphenous vein was also interrogated. Spectral Doppler was utilized
to evaluate flow at rest and with distal augmentation maneuvers in
the common femoral, femoral and popliteal veins.

[Series 1: us extrem low venous*right* · 0.05mm/px · 13 of 42 slices shown]
[im 1/42]
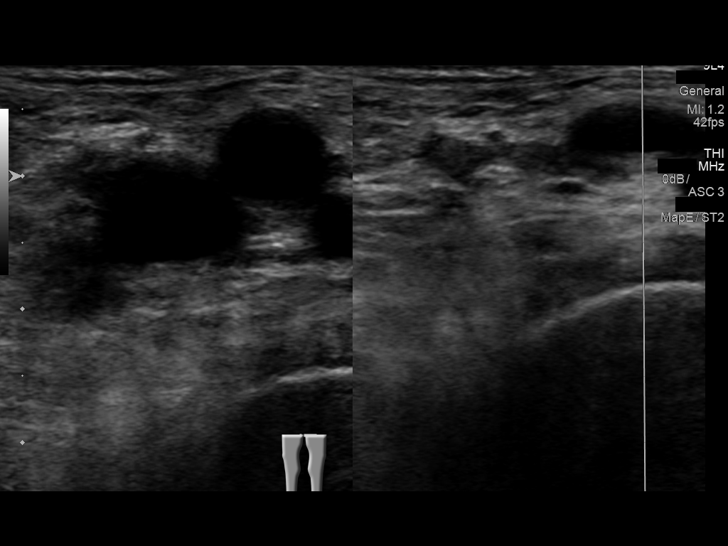
[im 4/42]
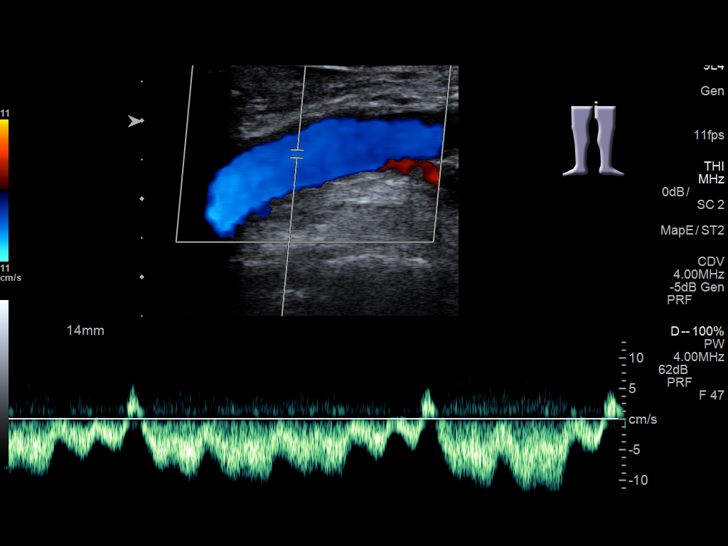
[im 8/42]
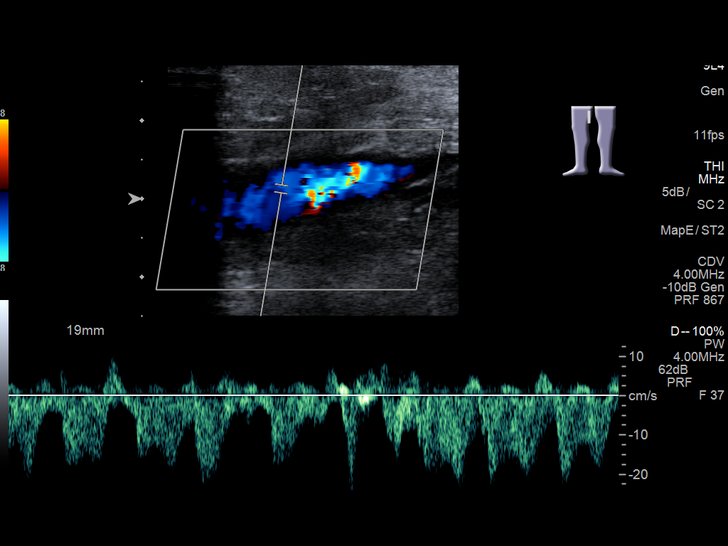
[im 11/42]
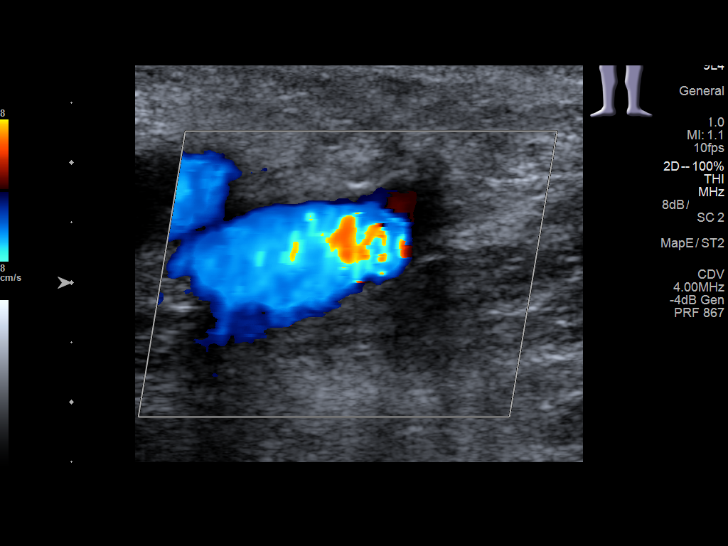
[im 15/42]
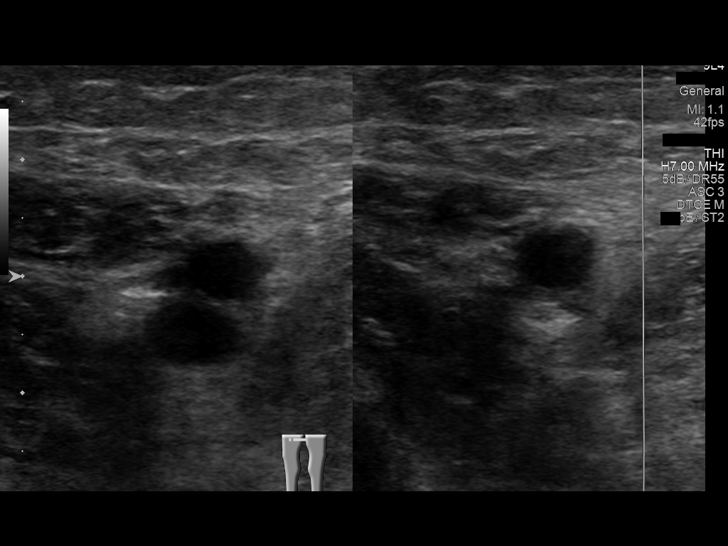
[im 18/42]
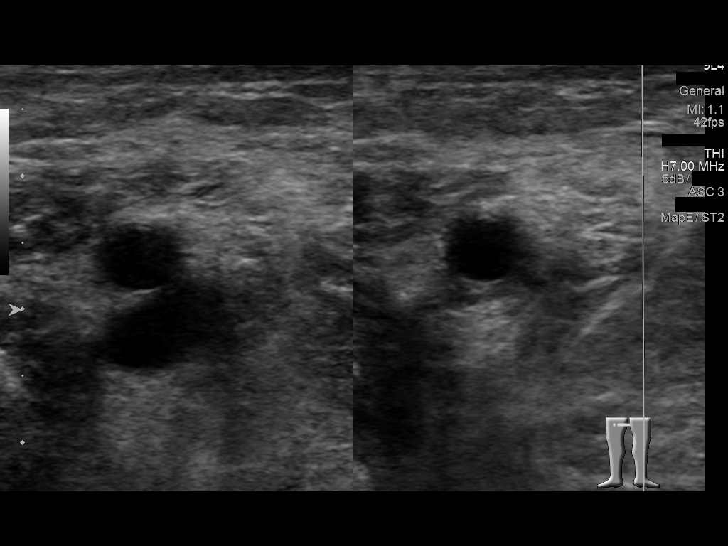
[im 22/42]
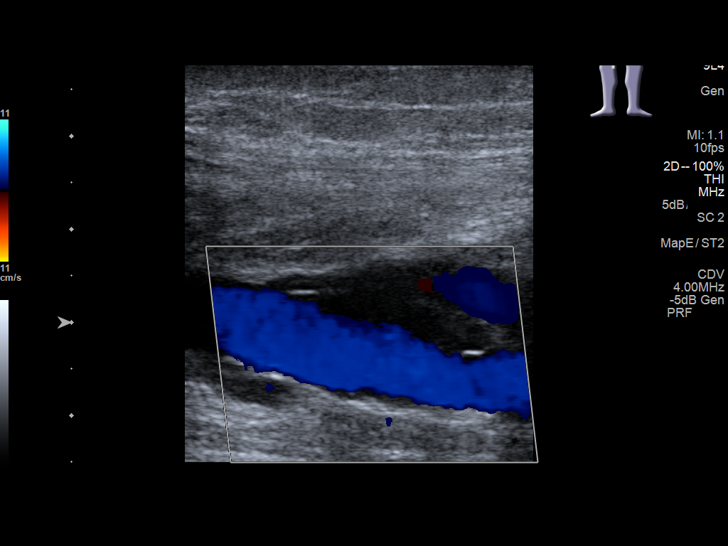
[im 24/42]
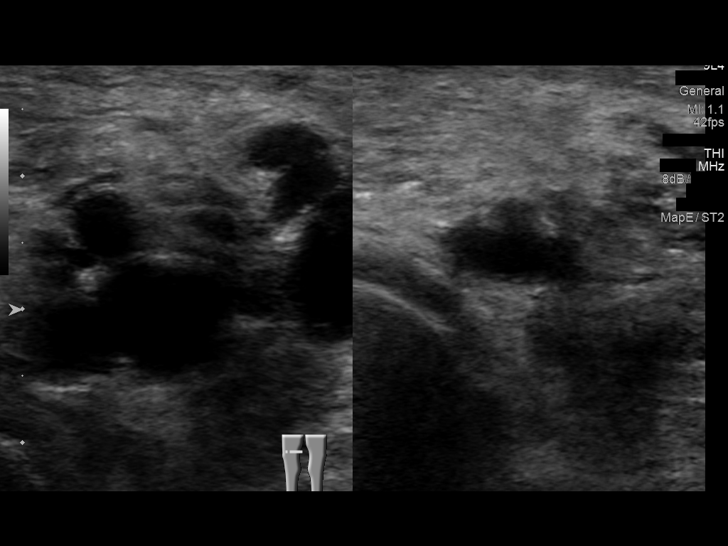
[im 27/42]
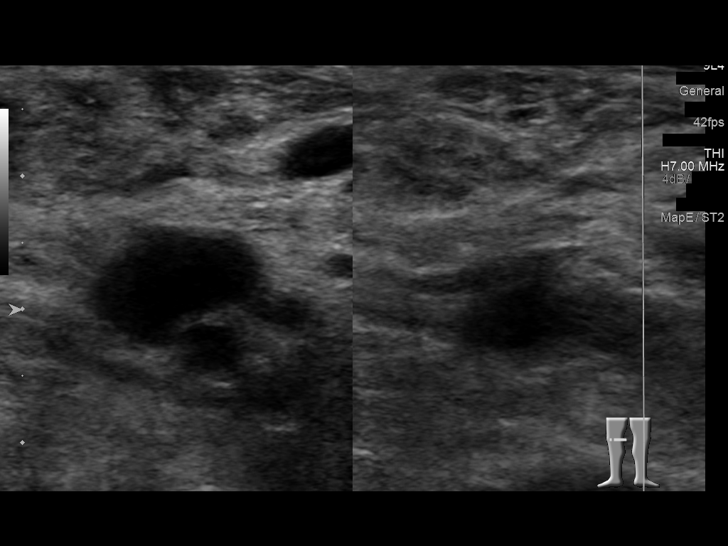
[im 31/42]
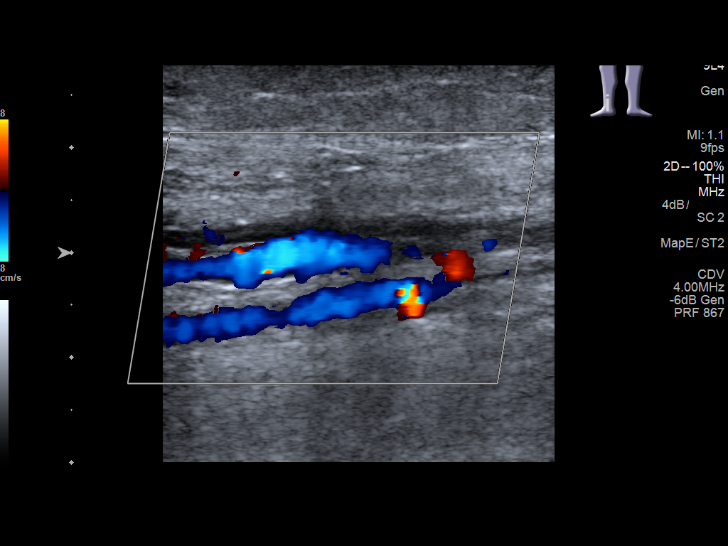
[im 34/42]
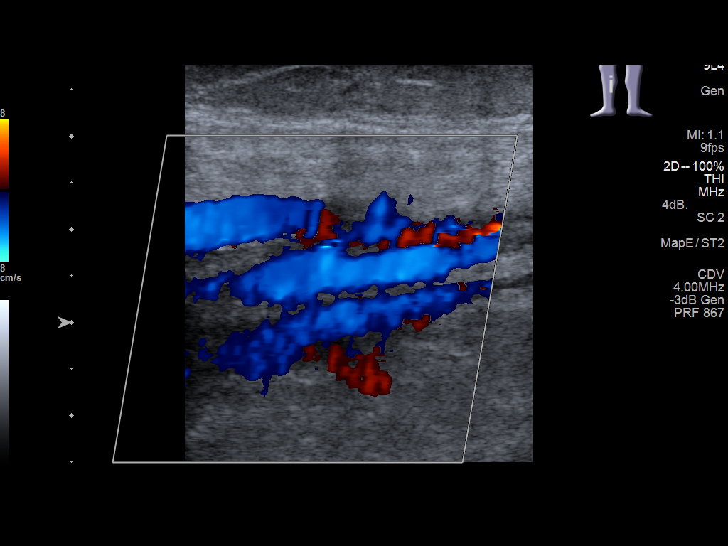
[im 38/42]
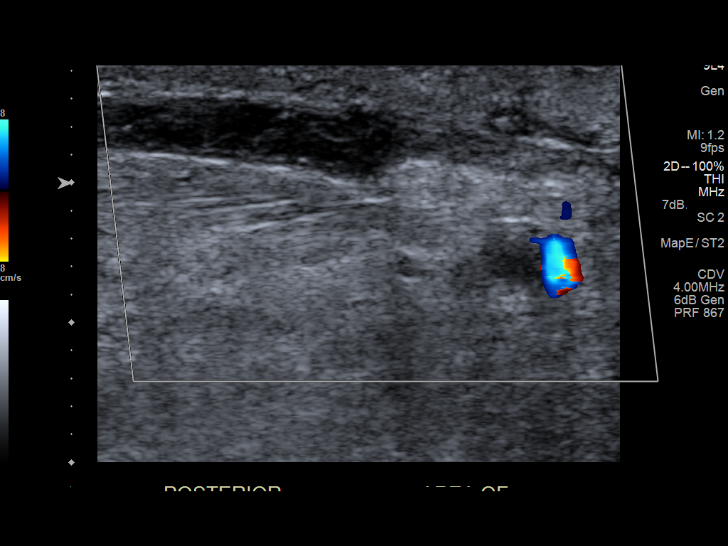
[im 42/42]
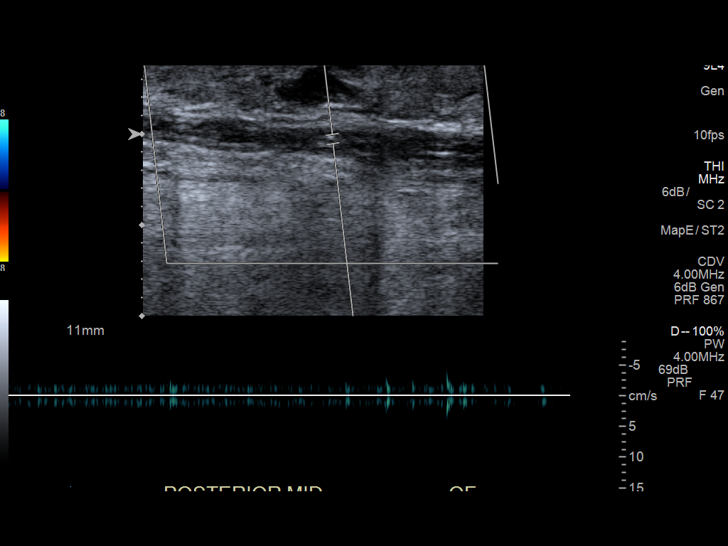

[13 of 24 positions shown; findings below may reference images not displayed]

FINDINGS: Contralateral Common Femoral Vein: Respiratory phasicity is normal
and symmetric with the symptomatic side. No evidence of thrombus.
Normal compressibility.

Common Femoral Vein: No evidence of thrombus. Normal
compressibility, respiratory phasicity and response to augmentation.

Saphenofemoral Junction: No evidence of thrombus. Normal
compressibility and flow on color Doppler imaging.

Profunda Femoral Vein: No evidence of thrombus. Normal
compressibility and flow on color Doppler imaging.

Femoral Vein: No evidence of thrombus. Normal compressibility,
respiratory phasicity and response to augmentation.

Popliteal Vein: No evidence of thrombus. Normal compressibility,
respiratory phasicity and response to augmentation.

Calf Veins: No evidence of thrombus. Normal compressibility and flow
on color Doppler imaging.

Superficial Great Saphenous Vein: No evidence of thrombus. Normal
compressibility and flow on color Doppler imaging.

Venous Reflux:  None.

Other Findings: There are varicose veins in the calf region. A
superficial thrombus is noted in a varicose vein in the right calf
region.
IMPRESSION: No evidence of deep venous thrombosis in the right lower extremity.
Left common femoral vein patent. There are superficial varices in
the calf region on the right with a superficial venous thrombosis in
a calf varix which appears acute.

## 2017-04-20 DIAGNOSIS — D692 Other nonthrombocytopenic purpura: Secondary | ICD-10-CM | POA: Diagnosis not present

## 2017-04-20 DIAGNOSIS — L821 Other seborrheic keratosis: Secondary | ICD-10-CM | POA: Diagnosis not present

## 2017-04-20 DIAGNOSIS — D225 Melanocytic nevi of trunk: Secondary | ICD-10-CM | POA: Diagnosis not present

## 2017-04-20 DIAGNOSIS — Z85828 Personal history of other malignant neoplasm of skin: Secondary | ICD-10-CM | POA: Diagnosis not present

## 2017-04-22 ENCOUNTER — Other Ambulatory Visit: Payer: Self-pay | Admitting: *Deleted

## 2017-04-22 ENCOUNTER — Encounter: Payer: Self-pay | Admitting: Internal Medicine

## 2017-04-22 ENCOUNTER — Ambulatory Visit (INDEPENDENT_AMBULATORY_CARE_PROVIDER_SITE_OTHER): Payer: Medicare Other | Admitting: Internal Medicine

## 2017-04-22 VITALS — BP 110/70 | HR 69 | Resp 16 | Ht 60.0 in | Wt 96.0 lb

## 2017-04-22 DIAGNOSIS — R938 Abnormal findings on diagnostic imaging of other specified body structures: Secondary | ICD-10-CM

## 2017-04-22 DIAGNOSIS — R9389 Abnormal findings on diagnostic imaging of other specified body structures: Secondary | ICD-10-CM

## 2017-04-22 MED ORDER — FORMOTEROL FUMARATE 20 MCG/2ML IN NEBU: 20.0000 ug | INHALATION_SOLUTION | Freq: Two times a day (BID) | RESPIRATORY_TRACT | 5 refills | Status: DC

## 2017-04-22 NOTE — Progress Notes (Signed)
[facility] Consultation      Date: 04/22/2017,   MRN# [id number] Sierra Benson [birth year] Code Status:  Code Status History    This patient does not have a recorded code status. Please follow your organizational policy for patients in this situation.     Hosp day:@LENGTHOFSTAYDAYS @ Referring MD: @ATDPROV @     PCP:      AdmissionWeight: 96 lb (43.5 kg)                 CurrentWeight: 96 lb (43.5 kg) Sierra Benson is a 83 y.o. old female seen in consultation for pneumonia at the request of Dr. Orlene Plum     CHIEF COMPLAINT:  Chronic opacity on CXR   HISTORY OF PRESENT ILLNESS  83 yo white female seen today for follow up abnormal CXR and CT chest 11/2015 pattern of chronic bronchiectasis in both lungs, most notable in the right upper lobe and left upper lobe but also with involvement extensively in the lingula. There are numerous pulmonary nodules, many showing tree in bud configuration. Many of these show internal calcifications.   She had not followed up last year but has had progressive CXR findings recently  AT this time, patient has no major Resp symptoms at this time   patient is asymptomatic, no fevers, no weight loss, no night sweats  I have discussed findings with patient, patient had productive cough with gray sputum for past several months and  was treated for pneumonia with z pack and levaquin 1 year  ago  Patient has no other resp issues at this, she exercises regularily and has no WOB or SOB She denies fevers, chills, NVD  She works Customer service manager, she has no chronic environmental exposure  remote smoker 1 ppd for 2 years  Patient has had pneumonia in the past approx 10 years ago. Patient does NOT take any inhalers at this time, denies wheezing I have discussed options for Bronch and Biopsy and at this time, she DOES NOT WANT to pursue any further diagnosis.  She is willing to get CT scans but I suggested that there is no  reason to get scans if we will not attempt any procedures I have disc    Current Medication:  Current Outpatient Prescriptions:  .  bimatoprost (LUMIGAN) 0.01 % SOLN, Place 1 drop into both eyes at bedtime. , Disp: , Rfl:  .  magnesium oxide (MAG-OX) 400 MG tablet, Take 400 mg by mouth daily., Disp: , Rfl:  .  Multiple Vitamins-Minerals (MULTIVITAMIN ADULT PO), Take 1 tablet by mouth daily. , Disp: , Rfl:  .  timolol (TIMOPTIC) 0.5 % ophthalmic solution, Apply to eye., Disp: , Rfl:  .  formoterol (PERFOROMIST) 20 MCG/2ML nebulizer solution, Take 2 mLs (20 mcg total) by nebulization 2 (two) times daily., Disp: 120 mL, Rfl: 5 .  lithium 300 MG tablet, Take 1 tablet (300 mg total) by mouth at bedtime., Disp: 30 tablet, Rfl: 2    ALLERGIES   Propoxyphene; Celecoxib; Ibuprofen; and Prednisone     REVIEW OF SYSTEMS   Review of Systems  Constitutional: Negative for chills, fever, malaise/fatigue and weight loss.  HENT: Negative for congestion and hearing loss.   Eyes: Negative for blurred vision and double vision.  Respiratory: Negative for cough, hemoptysis, sputum production, shortness of breath and wheezing.   Cardiovascular: Negative for chest pain, palpitations, orthopnea and leg swelling.  Skin: Negative for rash.  Neurological: Negative for headaches.  All other systems reviewed and  are negative.    VS: BP 110/70 (BP Location: Left Arm, Patient Position: Sitting, Cuff Size: Normal)   Pulse 69   Resp 16   Ht 5' (1.524 m)   Wt 96 lb (43.5 kg)   BMI 18.75 kg/m      PHYSICAL EXAM  Physical Exam  Constitutional: She is oriented to person, place, and time. No distress.  HENT:  Mouth/Throat: No oropharyngeal exudate.  Cardiovascular: Normal rate, regular rhythm and normal heart sounds.   No murmur heard. Pulmonary/Chest: Effort normal and breath sounds normal. No stridor. No respiratory distress. She has no wheezes. She has no rales.  Musculoskeletal: Normal range of  motion. She exhibits no edema.  Neurological: She is alert and oriented to person, place, and time.  Skin: Skin is warm. She is not diaphoretic.  Psychiatric: She has a normal mood and affect.    Ct chest 06/07/16 images reviewed 04/22/2017  CXR 02/2017 progressive RUL opacity       ASSESSMENT/PLAN   83 yo white female seen today for abnormal CT chest and CXR and after further assessment and reviews of imaging studies,   patient has chronic bronchiectasis with intermittent  sputum production with previous bout of atypical pneumonia with what appears to be mucoid impaction in the past  Patient REFUSES BRONCHOSCOPY AT THIS TIME and DOE SNOT WANT ANY TYPE OF BIOPSY AT THIS TIME We discussed that her CT and CXR does NOT match with her clinical symptoms  She is very active and has no resp issues at this time I have explained that there is no reason to obtain CT chest if there is no plan for intervention. She agrees with me 100% and is very satisfied with plan of action.   Follow up in 6 months Patient  satisfied with Plan of action and management. All questions answered  Corrin Parker, M.D.  [facility]  Medical Director [place] Director [facility] Cardio-Pulmonary Department

## 2017-04-22 NOTE — Patient Instructions (Signed)
Follow up in 6 months 

## 2017-06-02 ENCOUNTER — Encounter: Payer: Self-pay | Admitting: Obstetrics and Gynecology

## 2017-06-06 NOTE — Progress Notes (Signed)
Patient ID: Sierra Benson, female   DOB: 1929/06/16, 83 y.o.   MRN: [id number] ANNUAL PREVENTATIVE CARE GYN  ENCOUNTER NOTE  Subjective:       Sierra Benson is a 83 y.o. G58P3003 female here for a routine annual gynecologic exam.  Current complaints: 1. None  Patient has chronic low back discomfort, possibly thought secondary to scoliosis and arthritis. Currently she is being followed by Dr. Mortimer Fries in pulmonology for a lung lesion and respiratory symptoms thought possibly related to allergies.   Gynecologic History No LMP recorded. Patient has had a hysterectomy. Contraception: status post hysterectomy Last Pap: 10/23/2014 pap reflx neg. Results were: normal Last mammogram: 10/21/2015. Results were: normal  Obstetric History OB History  Gravida Para Term Preterm AB Living  3 3 3     3   SAB TAB Ectopic Multiple Live Births          3    # Outcome Date GA Lbr Len/2nd Weight Sex Delivery Anes PTL Lv  3 Term 1961   7 lb 4.8 oz (3.311 kg) F VBAC   LIV  2 Term 1956   6 lb 8 oz (2.948 kg) M Vag-Spont   LIV  1 Term 1954   6 lb 4.8 oz (2.858 kg) M Vag-Spont   LIV      Past Medical History:  Diagnosis Date  . Bacterial pneumonia 11/2015  . Cystocele   . Glaucoma   . Insomnia   . Menopausal state   . Mitral valve disorder   . Nocturia   . Prediabetes 06/19/2015  . Rectocele   . Vaginal atrophy   . Varicose veins   . Vitamin D deficiency     Past Surgical History:  Procedure Laterality Date  . BREAST BIOPSY Right    benign nodule  . EYE SURGERY Left    cataract removed  . FOOT SURGERY    . VAGINAL HYSTERECTOMY     menorrhagia    Current Outpatient Prescriptions on File Prior to Visit  Medication Sig Dispense Refill  . bimatoprost (LUMIGAN) 0.01 % SOLN Place 1 drop into both eyes at bedtime.     Marland Kitchen lithium 300 MG tablet Take 1 tablet (300 mg total) by mouth at bedtime. 30 tablet 2  . magnesium oxide (MAG-OX) 400 MG tablet Take 400 mg by mouth daily.    . Multiple  Vitamins-Minerals (MULTIVITAMIN ADULT PO) Take 1 tablet by mouth daily.      No current facility-administered medications on file prior to visit.     Allergies  Allergen Reactions  . Propoxyphene Hives  . Celecoxib Nausea And Vomiting  . Ibuprofen Other (See Comments)  . Prednisone Other (See Comments)    Prednisone Intensol    Social History   Social History  . Marital status: Married    Spouse name: N/A  . Number of children: N/A  . Years of education: N/A   Occupational History  . Not on file.   Social History Main Topics  . Smoking status: Never Smoker  . Smokeless tobacco: Never Used  . Alcohol use No  . Drug use: No  . Sexual activity: Yes    Birth control/ protection: Surgical   Other Topics Concern  . Not on file   Social History Narrative  . No narrative on file    Family History  Problem Relation Age of Onset  . Diabetes Brother   . Lung cancer Brother   . Breast cancer Sister   . Ovarian  cancer Neg Hx   . Colon cancer Neg Hx   . Heart disease Neg Hx     The following portions of the patient's history were reviewed and updated as appropriate: allergies, current medications, past family history, past medical history, past social history, past surgical history and problem list.  Review of Systems ROS Review of Systems - General ROS: negative for - chills, fatigue, fever, hot flashes, night sweats, weight gain.  POSITIVE-weight loss Psychological ROS: negative for - anxiety, decreased libido, depression, mood swings, physical abuse or sexual abuse Ophthalmic ROS: negative for - blurry vision, eye pain or loss of vision ENT ROS: negative for - headaches, hearing change, visual changes or vocal changes Allergy and Immunology ROS: negative for - hives, itchy/watery eyes or seasonal allergies Hematological and Lymphatic ROS: negative for - bleeding problems, bruising, swollen lymph nodes or weight loss Endocrine ROS: negative for - galactorrhea, hair  pattern changes, hot flashes, malaise/lethargy, mood swings, palpitations, polydipsia/polyuria, skin changes, temperature intolerance or unexpected weight changes Breast ROS: negative for - new or changing breast lumps or nipple discharge Respiratory ROS: negative for - cough or shortness of breath Cardiovascular ROS: negative for - chest pain, irregular heartbeat, palpitations or shortness of breath Gastrointestinal ROS: no abdominal pain, change in bowel habits, or black or bloody stools Genito-Urinary ROS: no dysuria, trouble voiding, or hematuria Musculoskeletal ROS: negative for - joint pain or joint stiffness Neurological ROS: negative for - bowel and bladder control changes Dermatological ROS: negative for rash and skin lesion changes   Objective:   BP (!) 155/74   Pulse 75   Ht 5' (1.524 m)   Wt 94 lb 6.4 oz (42.8 kg)   BMI 18.44 kg/m  CONSTITUTIONAL: Thin Well-developed, well-nourished female in no acute distress.  PSYCHIATRIC: Normal mood and affect. Normal behavior. Normal judgment and thought content. [place]: Alert and oriented to person, place, and time. Normal muscle tone coordination. No cranial nerve deficit noted. HENT:  Normocephalic, atraumatic, External right and left ear normal. Oropharynx is clear and moist EYES: Conjunctivae and EOM are normal. Pupils are equal, round, and reactive to light. No scleral icterus.  NECK: Normal range of motion, supple, no masses.  Normal thyroid.  SKIN: Skin is warm and dry. No rash noted. Not diaphoretic. No erythema. No pallor. CARDIOVASCULAR: Normal heart rate noted, regular rhythm, no murmur. RESPIRATORY: Clear to auscultation bilaterally. Effort and breath sounds normal, no problems with respiration noted. BREASTS: Symmetric in size. No masses, skin changes, nipple drainage, or lymphadenopathy. ABDOMEN: Soft, normal bowel sounds, no distention noted.  No tenderness, rebound or guarding.  BLADDER: Normal PELVIC:  External  Genitalia: Normal  BUS: Normal  Vagina: Normal  Cervix: Surgically absent  Uterus: Surgically absent  Adnexa: Normal  RV: External Exam NormaI, No Rectal Masses and Normal Sphincter tone  MUSCULOSKELETAL: Normal range of motion. No tenderness.  No cyanosis, clubbing, or edema.  2+ distal pulses. LYMPHATIC: No Axillary, Supraclavicular, or Inguinal Adenopathy.    Assessment:   Annual gynecologic examination 83 y.o. Contraception: status post hysterectomy Under weight Problem List Items Addressed This Visit    Underweight due to inadequate caloric intake    Other Visit Diagnoses    Well woman exam    -  Primary   Screening for breast cancer       Menopause       Screening for colon cancer       Pre-diabetes          Plan:  Pap:  Not needed Mammogram: ordered. Will defer future mammograms unless abnormal findings are identified on exam Stool Guaiac Testing:  Ordered Labs: a1c fbs lipid tsh Routine preventative health maintenance measures emphasized: Exercise/Diet/Weight control, Tobacco Warnings, Alcohol/Substance use risks and Safe Sex Lubricants for intercourse as needed Continue calcium with vitamin D supplementation and weightbearing exercise Return to [place], CMA  Brayton Mars, MD   Note: This dictation was prepared with Dragon dictation along with smaller phrase technology. Any transcriptional errors that result from this process are unintentional.

## 2017-06-09 ENCOUNTER — Ambulatory Visit (INDEPENDENT_AMBULATORY_CARE_PROVIDER_SITE_OTHER): Payer: Medicare Other | Admitting: Obstetrics and Gynecology

## 2017-06-09 ENCOUNTER — Encounter: Payer: Self-pay | Admitting: Obstetrics and Gynecology

## 2017-06-09 VITALS — BP 155/74 | HR 75 | Ht 60.0 in | Wt 94.4 lb

## 2017-06-09 DIAGNOSIS — Z1239 Encounter for other screening for malignant neoplasm of breast: Secondary | ICD-10-CM

## 2017-06-09 DIAGNOSIS — Z780 Asymptomatic menopausal state: Secondary | ICD-10-CM | POA: Diagnosis not present

## 2017-06-09 DIAGNOSIS — R7303 Prediabetes: Secondary | ICD-10-CM

## 2017-06-09 DIAGNOSIS — M545 Low back pain, unspecified: Secondary | ICD-10-CM

## 2017-06-09 DIAGNOSIS — R636 Underweight: Secondary | ICD-10-CM | POA: Diagnosis not present

## 2017-06-09 DIAGNOSIS — Z1211 Encounter for screening for malignant neoplasm of colon: Secondary | ICD-10-CM | POA: Diagnosis not present

## 2017-06-09 DIAGNOSIS — Z01419 Encounter for gynecological examination (general) (routine) without abnormal findings: Secondary | ICD-10-CM

## 2017-06-09 DIAGNOSIS — Z1231 Encounter for screening mammogram for malignant neoplasm of breast: Secondary | ICD-10-CM

## 2017-06-09 LAB — POCT URINALYSIS DIPSTICK
Bilirubin: NEGATIVE
GLUCOSE UA: NEGATIVE
KETONES UA: NEGATIVE
Leukocytes, UA: NEGATIVE
Nitrite, UA: NEGATIVE
Protein, UA: NEGATIVE
RBC UA: NEGATIVE
UROBILINOGEN UA: 0.2 U/dL
pH, UA: 7 (ref 5.0–8.0)

## 2017-06-09 NOTE — Patient Instructions (Signed)
1. No Pap smear is needed 2. Mammogram is ordered 3. Stool guaiac cards are given for colon cancer screening 4. Screening labs through primary care 5. Continue with calcium and vitamin D supplementation daily 6. Follow-up with ENT for allergy testing 7. Follow-up with pulmonary for abnormal chest x-ray follow-up 8. Return in 1 year for annual exam   Health Maintenance for Postmenopausal Women Menopause is a normal process in which your reproductive ability comes to an end. This process happens gradually over a span of months to years, usually between the ages of 62 and 65. Menopause is complete when you have missed 12 consecutive menstrual periods. It is important to talk with your health care provider about some of the most common conditions that affect postmenopausal women, such as heart disease, cancer, and bone loss (osteoporosis). Adopting a healthy lifestyle and getting preventive care can help to promote your health and wellness. Those actions can also lower your chances of developing some of these common conditions. What should I know about menopause? During menopause, you may experience a number of symptoms, such as:  Moderate-to-severe hot flashes.  Night sweats.  Decrease in sex drive.  Mood swings.  Headaches.  Tiredness.  Irritability.  Memory problems.  Insomnia.  Choosing to treat or not to treat menopausal changes is an individual decision that you make with your health care provider. What should I know about hormone replacement therapy and supplements? Hormone therapy products are effective for treating symptoms that are associated with menopause, such as hot flashes and night sweats. Hormone replacement carries certain risks, especially as you become older. If you are thinking about using estrogen or estrogen with progestin treatments, discuss the benefits and risks with your health care provider. What should I know about heart disease and stroke? Heart  disease, heart attack, and stroke become more likely as you age. This may be due, in part, to the hormonal changes that your body experiences during menopause. These can affect how your body processes dietary fats, triglycerides, and cholesterol. Heart attack and stroke are both medical emergencies. There are many things that you can do to help prevent heart disease and stroke:  Have your blood pressure checked at least every 1-2 years. High blood pressure causes heart disease and increases the risk of stroke.  If you are 83-98 years old, ask your health care provider if you should take aspirin to prevent a heart attack or a stroke.  Do not use any tobacco products, including cigarettes, chewing tobacco, or electronic cigarettes. If you need help quitting, ask your health care provider.  It is important to eat a healthy diet and maintain a healthy weight. ? Be sure to include plenty of vegetables, fruits, low-fat dairy products, and lean protein. ? Avoid eating foods that are high in solid fats, added sugars, or salt (sodium).  Get regular exercise. This is one of the most important things that you can do for your health. ? Try to exercise for at least 150 minutes each week. The type of exercise that you do should increase your heart rate and make you sweat. This is known as moderate-intensity exercise. ? Try to do strengthening exercises at least twice each week. Do these in addition to the moderate-intensity exercise.  Know your numbers.Ask your health care provider to check your cholesterol and your blood glucose. Continue to have your blood tested as directed by your health care provider.  What should I know about cancer screening? There are several types of cancer.  Take the following steps to reduce your risk and to catch any cancer development as early as possible. Breast Cancer  Practice breast self-awareness. ? This means understanding how your breasts normally appear and feel. ? It  also means doing regular breast self-exams. Let your health care provider know about any changes, no matter how small.  If you are 55 or older, have a clinician do a breast exam (clinical breast exam or CBE) every year. Depending on your age, family history, and medical history, it may be recommended that you also have a yearly breast X-ray (mammogram).  If you have a family history of breast cancer, talk with your health care provider about genetic screening.  If you are at high risk for breast cancer, talk with your health care provider about having an MRI and a mammogram every year.  Breast cancer (BRCA) gene test is recommended for women who have family members with BRCA-related cancers. Results of the assessment will determine the need for genetic counseling and BRCA1 and for BRCA2 testing. BRCA-related cancers include these types: ? Breast. This occurs in males or females. ? Ovarian. ? Tubal. This may also be called fallopian tube cancer. ? Cancer of the abdominal or pelvic lining (peritoneal cancer). ? Prostate. ? Pancreatic.  Cervical, Uterine, and Ovarian Cancer Your health care provider may recommend that you be screened regularly for cancer of the pelvic organs. These include your ovaries, uterus, and vagina. This screening involves a pelvic exam, which includes checking for microscopic changes to the surface of your cervix (Pap test).  For women ages 21-65, health care providers may recommend a pelvic exam and a Pap test every three years. For women ages 27-65, they may recommend the Pap test and pelvic exam, combined with testing for human papilloma virus (HPV), every five years. Some types of HPV increase your risk of cervical cancer. Testing for HPV may also be done on women of any age who have unclear Pap test results.  Other health care providers may not recommend any screening for nonpregnant women who are considered low risk for pelvic cancer and have no symptoms. Ask your  health care provider if a screening pelvic exam is right for you.  If you have had past treatment for cervical cancer or a condition that could lead to cancer, you need Pap tests and screening for cancer for at least 20 years after your treatment. If Pap tests have been discontinued for you, your risk factors (such as having a new sexual partner) need to be reassessed to determine if you should start having screenings again. Some women have medical problems that increase the chance of getting cervical cancer. In these cases, your health care provider may recommend that you have screening and Pap tests more often.  If you have a family history of uterine cancer or ovarian cancer, talk with your health care provider about genetic screening.  If you have vaginal bleeding after reaching menopause, tell your health care provider.  There are currently no reliable tests available to screen for ovarian cancer.  Lung Cancer Lung cancer screening is recommended for adults 30-8 years old who are at high risk for lung cancer because of a history of smoking. A yearly low-dose CT scan of the lungs is recommended if you:  Currently smoke.  Have a history of at least 30 pack-years of smoking and you currently smoke or have quit within the past 15 years. A pack-year is smoking an average of one pack of cigarettes  per day for one year.  Yearly screening should:  Continue until it has been 15 years since you quit.  Stop if you develop a health problem that would prevent you from having lung cancer treatment.  Colorectal Cancer  This type of cancer can be detected and can often be prevented.  Routine colorectal cancer screening usually begins at age 30 and continues through age 13.  If you have risk factors for colon cancer, your health care provider may recommend that you be screened at an earlier age.  If you have a family history of colorectal cancer, talk with your health care provider about genetic  screening.  Your health care provider may also recommend using home test kits to check for hidden blood in your stool.  A small camera at the end of a tube can be used to examine your colon directly (sigmoidoscopy or colonoscopy). This is done to check for the earliest forms of colorectal cancer.  Direct examination of the colon should be repeated every 5-10 years until age 28. However, if early forms of precancerous polyps or small growths are found or if you have a family history or genetic risk for colorectal cancer, you may need to be screened more often.  Skin Cancer  Check your skin from head to toe regularly.  Monitor any moles. Be sure to tell your health care provider: ? About any new moles or changes in moles, especially if there is a change in a mole's shape or color. ? If you have a mole that is larger than the size of a pencil eraser.  If any of your family members has a history of skin cancer, especially at a young age, talk with your health care provider about genetic screening.  Always use sunscreen. Apply sunscreen liberally and repeatedly throughout the day.  Whenever you are outside, protect yourself by wearing long sleeves, pants, a wide-brimmed hat, and sunglasses.  What should I know about osteoporosis? Osteoporosis is a condition in which bone destruction happens more quickly than new bone creation. After menopause, you may be at an increased risk for osteoporosis. To help prevent osteoporosis or the bone fractures that can happen because of osteoporosis, the following is recommended:  If you are 43-55 years old, get at least 1,000 mg of calcium and at least 600 mg of vitamin D per day.  If you are older than age 35 but younger than age 20, get at least 1,200 mg of calcium and at least 600 mg of vitamin D per day.  If you are older than age 63, get at least 1,200 mg of calcium and at least 800 mg of vitamin D per day.  Smoking and excessive alcohol intake  increase the risk of osteoporosis. Eat foods that are rich in calcium and vitamin D, and do weight-bearing exercises several times each week as directed by your health care provider. What should I know about how menopause affects my mental health? Depression may occur at any age, but it is more common as you become older. Common symptoms of depression include:  Low or sad mood.  Changes in sleep patterns.  Changes in appetite or eating patterns.  Feeling an overall lack of motivation or enjoyment of activities that you previously enjoyed.  Frequent crying spells.  Talk with your health care provider if you think that you are experiencing depression. What should I know about immunizations? It is important that you get and maintain your immunizations. These include:  Tetanus, diphtheria, and  pertussis (Tdap) booster vaccine.  Influenza every year before the flu season begins.  Pneumonia vaccine.  Shingles vaccine.  Your health care provider may also recommend other immunizations. This information is not intended to replace advice given to you by your health care provider. Make sure you discuss any questions you have with your health care provider. Document Released: 01/28/2006 Document Revised: 06/25/2016 Document Reviewed: 09/09/2015 Elsevier Interactive Patient Education  2018 [employer].

## 2017-06-10 DIAGNOSIS — S6710XA Crushing injury of unspecified finger(s), initial encounter: Secondary | ICD-10-CM | POA: Insufficient documentation

## 2017-06-11 LAB — URINE CULTURE

## 2017-06-17 DIAGNOSIS — H6980 Other specified disorders of Eustachian tube, unspecified ear: Secondary | ICD-10-CM | POA: Diagnosis not present

## 2017-06-17 DIAGNOSIS — H6123 Impacted cerumen, bilateral: Secondary | ICD-10-CM | POA: Diagnosis not present

## 2017-06-17 DIAGNOSIS — J301 Allergic rhinitis due to pollen: Secondary | ICD-10-CM | POA: Diagnosis not present

## 2017-06-17 DIAGNOSIS — J305 Allergic rhinitis due to food: Secondary | ICD-10-CM | POA: Diagnosis not present

## 2017-06-23 ENCOUNTER — Other Ambulatory Visit: Payer: Medicare Other

## 2017-06-23 DIAGNOSIS — R7303 Prediabetes: Secondary | ICD-10-CM | POA: Diagnosis not present

## 2017-06-23 DIAGNOSIS — R636 Underweight: Secondary | ICD-10-CM | POA: Diagnosis not present

## 2017-06-23 DIAGNOSIS — Z01419 Encounter for gynecological examination (general) (routine) without abnormal findings: Secondary | ICD-10-CM | POA: Diagnosis not present

## 2017-06-24 LAB — HEMOGLOBIN A1C
Est. average glucose Bld gHb Est-mCnc: 105 mg/dL
Hgb A1c MFr Bld: 5.3 % (ref 4.8–5.6)

## 2017-06-24 LAB — GLUCOSE, RANDOM: GLUCOSE: 105 mg/dL — AB (ref 65–99)

## 2017-06-24 LAB — LIPID PANEL
CHOL/HDL RATIO: 2.5 ratio (ref 0.0–4.4)
Cholesterol, Total: 135 mg/dL (ref 100–199)
HDL: 53 mg/dL (ref >39)
LDL Calculated: 66 mg/dL (ref 0–99)
Triglycerides: 80 mg/dL (ref 0–149)
VLDL Cholesterol Cal: 16 mg/dL (ref 5–40)

## 2017-06-24 LAB — TSH: TSH: 2.25 u[IU]/mL (ref 0.450–4.500)

## 2017-06-29 DIAGNOSIS — H40153 Residual stage of open-angle glaucoma, bilateral: Secondary | ICD-10-CM | POA: Diagnosis not present

## 2017-07-15 ENCOUNTER — Telehealth: Payer: Self-pay | Admitting: Obstetrics and Gynecology

## 2017-07-15 NOTE — Telephone Encounter (Signed)
Pt aware of results. Per pt results printed out and left up front for p.u.

## 2017-07-15 NOTE — Telephone Encounter (Signed)
Please call patient with results.

## 2017-08-01 DIAGNOSIS — H40153 Residual stage of open-angle glaucoma, bilateral: Secondary | ICD-10-CM | POA: Diagnosis not present

## 2017-08-08 DIAGNOSIS — H401112 Primary open-angle glaucoma, right eye, moderate stage: Secondary | ICD-10-CM | POA: Diagnosis not present

## 2017-08-08 DIAGNOSIS — H401123 Primary open-angle glaucoma, left eye, severe stage: Secondary | ICD-10-CM | POA: Diagnosis not present

## 2017-08-11 DIAGNOSIS — M9902 Segmental and somatic dysfunction of thoracic region: Secondary | ICD-10-CM | POA: Diagnosis not present

## 2017-08-11 DIAGNOSIS — M62830 Muscle spasm of back: Secondary | ICD-10-CM | POA: Diagnosis not present

## 2017-08-11 DIAGNOSIS — M9903 Segmental and somatic dysfunction of lumbar region: Secondary | ICD-10-CM | POA: Diagnosis not present

## 2017-08-11 DIAGNOSIS — M4124 Other idiopathic scoliosis, thoracic region: Secondary | ICD-10-CM | POA: Diagnosis not present

## 2017-08-12 DIAGNOSIS — H40153 Residual stage of open-angle glaucoma, bilateral: Secondary | ICD-10-CM | POA: Diagnosis not present

## 2017-08-15 DIAGNOSIS — M9902 Segmental and somatic dysfunction of thoracic region: Secondary | ICD-10-CM | POA: Diagnosis not present

## 2017-08-15 DIAGNOSIS — M9903 Segmental and somatic dysfunction of lumbar region: Secondary | ICD-10-CM | POA: Diagnosis not present

## 2017-08-15 DIAGNOSIS — M4124 Other idiopathic scoliosis, thoracic region: Secondary | ICD-10-CM | POA: Diagnosis not present

## 2017-08-15 DIAGNOSIS — M62830 Muscle spasm of back: Secondary | ICD-10-CM | POA: Diagnosis not present

## 2017-08-17 DIAGNOSIS — M9903 Segmental and somatic dysfunction of lumbar region: Secondary | ICD-10-CM | POA: Diagnosis not present

## 2017-08-17 DIAGNOSIS — M9902 Segmental and somatic dysfunction of thoracic region: Secondary | ICD-10-CM | POA: Diagnosis not present

## 2017-08-17 DIAGNOSIS — M62830 Muscle spasm of back: Secondary | ICD-10-CM | POA: Diagnosis not present

## 2017-08-17 DIAGNOSIS — M4124 Other idiopathic scoliosis, thoracic region: Secondary | ICD-10-CM | POA: Diagnosis not present

## 2017-08-18 DIAGNOSIS — M4124 Other idiopathic scoliosis, thoracic region: Secondary | ICD-10-CM | POA: Diagnosis not present

## 2017-08-18 DIAGNOSIS — M9902 Segmental and somatic dysfunction of thoracic region: Secondary | ICD-10-CM | POA: Diagnosis not present

## 2017-08-18 DIAGNOSIS — M62830 Muscle spasm of back: Secondary | ICD-10-CM | POA: Diagnosis not present

## 2017-08-18 DIAGNOSIS — M9903 Segmental and somatic dysfunction of lumbar region: Secondary | ICD-10-CM | POA: Diagnosis not present

## 2017-08-19 DIAGNOSIS — R002 Palpitations: Secondary | ICD-10-CM | POA: Diagnosis not present

## 2017-08-23 DIAGNOSIS — H401433 Capsular glaucoma with pseudoexfoliation of lens, bilateral, severe stage: Secondary | ICD-10-CM | POA: Diagnosis not present

## 2017-08-24 DIAGNOSIS — M62830 Muscle spasm of back: Secondary | ICD-10-CM | POA: Diagnosis not present

## 2017-08-24 DIAGNOSIS — M4124 Other idiopathic scoliosis, thoracic region: Secondary | ICD-10-CM | POA: Diagnosis not present

## 2017-08-24 DIAGNOSIS — M9903 Segmental and somatic dysfunction of lumbar region: Secondary | ICD-10-CM | POA: Diagnosis not present

## 2017-08-24 DIAGNOSIS — M9902 Segmental and somatic dysfunction of thoracic region: Secondary | ICD-10-CM | POA: Diagnosis not present

## 2017-08-26 DIAGNOSIS — M62830 Muscle spasm of back: Secondary | ICD-10-CM | POA: Diagnosis not present

## 2017-08-26 DIAGNOSIS — M9903 Segmental and somatic dysfunction of lumbar region: Secondary | ICD-10-CM | POA: Diagnosis not present

## 2017-08-26 DIAGNOSIS — M9902 Segmental and somatic dysfunction of thoracic region: Secondary | ICD-10-CM | POA: Diagnosis not present

## 2017-08-26 DIAGNOSIS — M4124 Other idiopathic scoliosis, thoracic region: Secondary | ICD-10-CM | POA: Diagnosis not present

## 2017-08-29 DIAGNOSIS — M9902 Segmental and somatic dysfunction of thoracic region: Secondary | ICD-10-CM | POA: Diagnosis not present

## 2017-08-29 DIAGNOSIS — M62830 Muscle spasm of back: Secondary | ICD-10-CM | POA: Diagnosis not present

## 2017-08-29 DIAGNOSIS — M4124 Other idiopathic scoliosis, thoracic region: Secondary | ICD-10-CM | POA: Diagnosis not present

## 2017-08-29 DIAGNOSIS — M9903 Segmental and somatic dysfunction of lumbar region: Secondary | ICD-10-CM | POA: Diagnosis not present

## 2017-09-02 DIAGNOSIS — M4124 Other idiopathic scoliosis, thoracic region: Secondary | ICD-10-CM | POA: Diagnosis not present

## 2017-09-02 DIAGNOSIS — M62830 Muscle spasm of back: Secondary | ICD-10-CM | POA: Diagnosis not present

## 2017-09-02 DIAGNOSIS — M9902 Segmental and somatic dysfunction of thoracic region: Secondary | ICD-10-CM | POA: Diagnosis not present

## 2017-09-02 DIAGNOSIS — M9903 Segmental and somatic dysfunction of lumbar region: Secondary | ICD-10-CM | POA: Diagnosis not present

## 2017-09-05 DIAGNOSIS — M9902 Segmental and somatic dysfunction of thoracic region: Secondary | ICD-10-CM | POA: Diagnosis not present

## 2017-09-05 DIAGNOSIS — M62830 Muscle spasm of back: Secondary | ICD-10-CM | POA: Diagnosis not present

## 2017-09-05 DIAGNOSIS — M9903 Segmental and somatic dysfunction of lumbar region: Secondary | ICD-10-CM | POA: Diagnosis not present

## 2017-09-05 DIAGNOSIS — M4124 Other idiopathic scoliosis, thoracic region: Secondary | ICD-10-CM | POA: Diagnosis not present

## 2017-09-07 DIAGNOSIS — M9902 Segmental and somatic dysfunction of thoracic region: Secondary | ICD-10-CM | POA: Diagnosis not present

## 2017-09-07 DIAGNOSIS — M4124 Other idiopathic scoliosis, thoracic region: Secondary | ICD-10-CM | POA: Diagnosis not present

## 2017-09-07 DIAGNOSIS — M62830 Muscle spasm of back: Secondary | ICD-10-CM | POA: Diagnosis not present

## 2017-09-07 DIAGNOSIS — M9903 Segmental and somatic dysfunction of lumbar region: Secondary | ICD-10-CM | POA: Diagnosis not present

## 2017-09-09 DIAGNOSIS — M9903 Segmental and somatic dysfunction of lumbar region: Secondary | ICD-10-CM | POA: Diagnosis not present

## 2017-09-09 DIAGNOSIS — M9902 Segmental and somatic dysfunction of thoracic region: Secondary | ICD-10-CM | POA: Diagnosis not present

## 2017-09-09 DIAGNOSIS — M4124 Other idiopathic scoliosis, thoracic region: Secondary | ICD-10-CM | POA: Diagnosis not present

## 2017-09-09 DIAGNOSIS — M62830 Muscle spasm of back: Secondary | ICD-10-CM | POA: Diagnosis not present

## 2017-09-12 DIAGNOSIS — M4124 Other idiopathic scoliosis, thoracic region: Secondary | ICD-10-CM | POA: Diagnosis not present

## 2017-09-12 DIAGNOSIS — M9903 Segmental and somatic dysfunction of lumbar region: Secondary | ICD-10-CM | POA: Diagnosis not present

## 2017-09-12 DIAGNOSIS — M62830 Muscle spasm of back: Secondary | ICD-10-CM | POA: Diagnosis not present

## 2017-09-12 DIAGNOSIS — M9902 Segmental and somatic dysfunction of thoracic region: Secondary | ICD-10-CM | POA: Diagnosis not present

## 2017-09-16 DIAGNOSIS — M62830 Muscle spasm of back: Secondary | ICD-10-CM | POA: Diagnosis not present

## 2017-09-16 DIAGNOSIS — M9903 Segmental and somatic dysfunction of lumbar region: Secondary | ICD-10-CM | POA: Diagnosis not present

## 2017-09-16 DIAGNOSIS — M9902 Segmental and somatic dysfunction of thoracic region: Secondary | ICD-10-CM | POA: Diagnosis not present

## 2017-09-16 DIAGNOSIS — M4124 Other idiopathic scoliosis, thoracic region: Secondary | ICD-10-CM | POA: Diagnosis not present

## 2017-09-19 DIAGNOSIS — M9902 Segmental and somatic dysfunction of thoracic region: Secondary | ICD-10-CM | POA: Diagnosis not present

## 2017-09-19 DIAGNOSIS — M4124 Other idiopathic scoliosis, thoracic region: Secondary | ICD-10-CM | POA: Diagnosis not present

## 2017-09-19 DIAGNOSIS — M62830 Muscle spasm of back: Secondary | ICD-10-CM | POA: Diagnosis not present

## 2017-09-19 DIAGNOSIS — M9903 Segmental and somatic dysfunction of lumbar region: Secondary | ICD-10-CM | POA: Diagnosis not present

## 2017-09-26 DIAGNOSIS — H40153 Residual stage of open-angle glaucoma, bilateral: Secondary | ICD-10-CM | POA: Diagnosis not present

## 2017-09-26 DIAGNOSIS — M9903 Segmental and somatic dysfunction of lumbar region: Secondary | ICD-10-CM | POA: Diagnosis not present

## 2017-09-26 DIAGNOSIS — M9902 Segmental and somatic dysfunction of thoracic region: Secondary | ICD-10-CM | POA: Diagnosis not present

## 2017-09-26 DIAGNOSIS — M62830 Muscle spasm of back: Secondary | ICD-10-CM | POA: Diagnosis not present

## 2017-09-26 DIAGNOSIS — M4124 Other idiopathic scoliosis, thoracic region: Secondary | ICD-10-CM | POA: Diagnosis not present

## 2017-09-28 DIAGNOSIS — H40153 Residual stage of open-angle glaucoma, bilateral: Secondary | ICD-10-CM | POA: Diagnosis not present

## 2017-09-30 DIAGNOSIS — H40153 Residual stage of open-angle glaucoma, bilateral: Secondary | ICD-10-CM | POA: Diagnosis not present

## 2017-10-05 DIAGNOSIS — H40153 Residual stage of open-angle glaucoma, bilateral: Secondary | ICD-10-CM | POA: Diagnosis not present

## 2017-10-07 DIAGNOSIS — M62830 Muscle spasm of back: Secondary | ICD-10-CM | POA: Diagnosis not present

## 2017-10-07 DIAGNOSIS — M4124 Other idiopathic scoliosis, thoracic region: Secondary | ICD-10-CM | POA: Diagnosis not present

## 2017-10-07 DIAGNOSIS — M9902 Segmental and somatic dysfunction of thoracic region: Secondary | ICD-10-CM | POA: Diagnosis not present

## 2017-10-07 DIAGNOSIS — M9903 Segmental and somatic dysfunction of lumbar region: Secondary | ICD-10-CM | POA: Diagnosis not present

## 2017-10-10 DIAGNOSIS — M9903 Segmental and somatic dysfunction of lumbar region: Secondary | ICD-10-CM | POA: Diagnosis not present

## 2017-10-10 DIAGNOSIS — M4124 Other idiopathic scoliosis, thoracic region: Secondary | ICD-10-CM | POA: Diagnosis not present

## 2017-10-10 DIAGNOSIS — M9902 Segmental and somatic dysfunction of thoracic region: Secondary | ICD-10-CM | POA: Diagnosis not present

## 2017-10-10 DIAGNOSIS — M62830 Muscle spasm of back: Secondary | ICD-10-CM | POA: Diagnosis not present

## 2017-10-12 DIAGNOSIS — H40153 Residual stage of open-angle glaucoma, bilateral: Secondary | ICD-10-CM | POA: Diagnosis not present

## 2017-10-14 DIAGNOSIS — M9903 Segmental and somatic dysfunction of lumbar region: Secondary | ICD-10-CM | POA: Diagnosis not present

## 2017-10-14 DIAGNOSIS — M4124 Other idiopathic scoliosis, thoracic region: Secondary | ICD-10-CM | POA: Diagnosis not present

## 2017-10-14 DIAGNOSIS — M9902 Segmental and somatic dysfunction of thoracic region: Secondary | ICD-10-CM | POA: Diagnosis not present

## 2017-10-14 DIAGNOSIS — M62830 Muscle spasm of back: Secondary | ICD-10-CM | POA: Diagnosis not present

## 2017-10-19 DIAGNOSIS — H40153 Residual stage of open-angle glaucoma, bilateral: Secondary | ICD-10-CM | POA: Diagnosis not present

## 2017-10-20 ENCOUNTER — Encounter: Payer: Self-pay | Admitting: Pulmonary Disease

## 2017-10-20 ENCOUNTER — Ambulatory Visit (INDEPENDENT_AMBULATORY_CARE_PROVIDER_SITE_OTHER): Payer: Medicare Other | Admitting: Pulmonary Disease

## 2017-10-20 VITALS — BP 116/80 | HR 69 | Ht 60.0 in | Wt 91.0 lb

## 2017-10-20 DIAGNOSIS — J479 Bronchiectasis, uncomplicated: Secondary | ICD-10-CM | POA: Diagnosis not present

## 2017-10-20 DIAGNOSIS — A310 Pulmonary mycobacterial infection: Secondary | ICD-10-CM

## 2017-10-20 MED ORDER — FLUTTER DEVI: 0 refills | Status: DC

## 2017-10-20 NOTE — Patient Instructions (Signed)
Use flutter valve provided -recommend 5 or 10 breaths through the flutter valve 3-4 times per day to help mobilize secretions from the lung  CT scan of chest ordered  Follow-up with Dr. Mortimer Fries in 3-4 weeks to review CT scan of the chest and discuss possible initiation of chest percussion vest

## 2017-10-21 DIAGNOSIS — M62830 Muscle spasm of back: Secondary | ICD-10-CM | POA: Diagnosis not present

## 2017-10-21 DIAGNOSIS — M4124 Other idiopathic scoliosis, thoracic region: Secondary | ICD-10-CM | POA: Diagnosis not present

## 2017-10-21 DIAGNOSIS — M9903 Segmental and somatic dysfunction of lumbar region: Secondary | ICD-10-CM | POA: Diagnosis not present

## 2017-10-21 DIAGNOSIS — M9902 Segmental and somatic dysfunction of thoracic region: Secondary | ICD-10-CM | POA: Diagnosis not present

## 2017-10-25 ENCOUNTER — Encounter: Payer: Self-pay | Admitting: Pulmonary Disease

## 2017-10-25 NOTE — Progress Notes (Signed)
PULMONARY OFFICE FOLLOW UP NOTE   PT PROFILE: 83 y.o. female never smoker with bronchiectasis. Pt of Dr Mortimer Fries.    SUBJ: Pt of [facility]. This is a routine re-eval initially scheduled for Dr Mortimer Fries. She has experienced no major change overall. She does report some fatigue and modest weight loss. She has chronic cough with "phlegm". She denies fever, CP, hemoptysis. She is on no chronic pulmonary meds and does not have any mucus clearance facilitation devices.  She expresses a strong desire to have a repeat CT scan of the chest performed  OBJ:  Vitals:   10/20/17 1107 10/20/17 1111  BP:  116/80  Pulse:  69  SpO2:  99%  Weight: 41.3 kg (91 lb)   Height: 5' (1.524 m)   RA   EXAM:  Gen: Appears much younger than her true age, thin but not cachectic, occasional rattling cough during encounter HEENT: WNL Neck: Supple without JVD Lungs: breath sounds mildly diminished with scattered rhonchi Cardiovascular: Reg, no murmurs noted Abdomen: Soft, nontender, normal BS Ext: without clubbing, cyanosis, edema Neuro: Grossly intact  Skin: Limited exam, no lesions noted  DATA:   BMP Latest Ref Rng & Units 06/23/2017 12/10/2015 06/13/2015  Glucose 65 - 99 mg/dL 105(H) 233(H) 85  BUN 6 - 20 mg/dL - 18 -  Creatinine 0.44 - 1.00 mg/dL - 0.85 -  Sodium 135 - 145 mmol/L - 136 -  Potassium 3.5 - 5.1 mmol/L - 3.8 -  Chloride 101 - 111 mmol/L - 102 -  CO2 22 - 32 mmol/L - 28 -  Calcium 8.9 - 10.3 mg/dL - 9.1 -    CBC Latest Ref Rng & Units 11/03/2016 03/30/2016 12/10/2015  WBC 3.4 - 10.8 x10E3/uL 8.8 6.6 6.9  Hemoglobin 11.1 - 15.9 g/dL 13.1 13.0 14.7  Hematocrit 34.0 - 46.6 % 40.3 39.6 43.9  Platelets 150 - 379 x10E3/uL 349 233 218    CXR: No new film  IMPRESSION:     ICD-10-CM   1. Bronchiectasis without complication ([place]) D55.2   2. 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 At her age and with what appears to be very slow progression, there is not much benefit to be had from pursuing this diagnosis aggressively.  Rather, we should focus our efforts on the management of bronchiectasis.  In particular, I would like her to work on mucus clearance techniques.  PLAN:  Flutter valve provided - recommend 5 or 10 breaths through the flutter valve 3-4 times per day   CT scan of chest ordered  Follow-up with Dr. Mortimer Fries in 3-4 weeks to review CT scan of the chest and discuss possible initiation of chest percussion vest   Merton Border, MD PCCM service Mobile [phone] Pager [phone] 10/25/2017 3:08 PM

## 2017-10-26 ENCOUNTER — Ambulatory Visit: Payer: Medicare Other

## 2017-11-03 ENCOUNTER — Ambulatory Visit
Admission: RE | Admit: 2017-11-03 | Discharge: 2017-11-03 | Disposition: A | Discharge status: Home / Self Care | Payer: Medicare Other | Source: Ambulatory Visit | Attending: Pulmonary Disease | Admitting: Pulmonary Disease

## 2017-11-03 DIAGNOSIS — M47814 Spondylosis without myelopathy or radiculopathy, thoracic region: Secondary | ICD-10-CM | POA: Insufficient documentation

## 2017-11-03 DIAGNOSIS — J90 Pleural effusion, not elsewhere classified: Secondary | ICD-10-CM | POA: Insufficient documentation

## 2017-11-03 DIAGNOSIS — I700 Atherosclerosis of aorta: Secondary | ICD-10-CM | POA: Diagnosis not present

## 2017-11-03 DIAGNOSIS — J479 Bronchiectasis, uncomplicated: Secondary | ICD-10-CM | POA: Insufficient documentation

## 2017-11-07 DIAGNOSIS — H40153 Residual stage of open-angle glaucoma, bilateral: Secondary | ICD-10-CM | POA: Diagnosis not present

## 2017-11-17 ENCOUNTER — Ambulatory Visit (INDEPENDENT_AMBULATORY_CARE_PROVIDER_SITE_OTHER): Payer: Medicare Other | Admitting: Internal Medicine

## 2017-11-17 ENCOUNTER — Encounter: Payer: Self-pay | Admitting: Internal Medicine

## 2017-11-17 VITALS — BP 118/78 | HR 68 | Resp 16 | Ht 60.0 in | Wt 90.0 lb

## 2017-11-17 DIAGNOSIS — J479 Bronchiectasis, uncomplicated: Secondary | ICD-10-CM

## 2017-11-17 NOTE — Patient Instructions (Signed)
Follow up in 6 months 

## 2017-11-17 NOTE — Progress Notes (Signed)
[facility] Consultation      Date: 11/17/2017,   MRN# [id number] DOROTHEY OETKEN Dec 07, 1929 Code Status:  Code Status History    This patient does not have a recorded code status. Please follow your organizational policy for patients in this situation.     Hosp day:@LENGTHOFSTAYDAYS @ Referring MD: @ATDPROV @     PCP:      AdmissionWeight: 90 lb (40.8 kg)                 CurrentWeight: 90 lb (40.8 kg) ANAI LIPSON is a 83 y.o. old female seen in consultation for pneumonia at the request of Dr. Orlene Plum     CHIEF COMPLAINT:  Chronic opacity on CXR   HISTORY OF PRESENT ILLNESS  83 yo white female seen today for follow up abnormal CXR and CT chest 11/2015 pattern of chronic bronchiectasis in both lungs, most notable in the right upper lobe and left upper lobe but also with involvement extensively in the lingula. There are numerous pulmonary nodules, many showing tree in bud configuration. Many of these show internal calcifications.   She had not followed up last year but has had progressive CXR findings recently  AT this time, patient has no major Resp symptoms at this time   patient is asymptomatic, no fevers, no weight loss, no night sweats  I have discussed findings with patient, patient had productive cough with gray sputum for past several months and  was treated for pneumonia with z pack and levaquin 1 year  ago  Patient has no other resp issues at this, she exercises regularily and has no WOB or SOB She denies fevers, chills, NVD  Patient wanted repeat CT chest-images reveiwed and findings explained to patient The nodular opacities have progressed since 05/2016.   However, patient feels  really well and is asymptomatic I have explained to her again if she wants to know what her lung findings are, she will need to undergo further diagnostic testing with lung biopsy and tissue sampling.  SHE HAS REFUSED TO UNDERGO ANY DIAGNOSTIC TESTING  AT THIS TIME SHE DOES NOT WANT ANY MORE CT SCANS    She works [employer], she has no chronic environmental exposure  remote smoker 1 ppd for 2 years  Patient has had pneumonia in the past approx 10 years ago. Patient does NOT take any inhalers at this time, denies wheezing I have discussed options for Bronch and Biopsy and at this time, she DOES NOT WANT to pursue any further diagnosis.  She is willing to get CT scans but I suggested that there is no reason to get scans if we will not attempt any procedures I have disc    Current Medication:  Current Outpatient Medications:  .  bimatoprost (LUMIGAN) 0.01 % SOLN, Place 1 drop into both eyes at bedtime. , Disp: , Rfl:  .  lithium 300 MG tablet, Take 1 tablet (300 mg total) by mouth at bedtime., Disp: 30 tablet, Rfl: 2 .  magnesium oxide (MAG-OX) 400 MG tablet, Take 400 mg by mouth daily., Disp: , Rfl:  .  Multiple Vitamins-Minerals (MULTIVITAMIN ADULT PO), Take 1 tablet by mouth daily. , Disp: , Rfl:  .  Respiratory Therapy Supplies (FLUTTER) DEVI, Use 10-15 times daily, Disp: 1 each, Rfl: 0 .  timolol (TIMOPTIC) 0.25 % ophthalmic solution, 1 drop 2 (two) times daily., Disp: , Rfl:     ALLERGIES   Propoxyphene; Celecoxib; Ibuprofen; and Prednisone     REVIEW  OF SYSTEMS   Review of Systems  Constitutional: Negative for chills, fever, malaise/fatigue and weight loss.  HENT: Negative for congestion and hearing loss.   Eyes: Negative for blurred vision and double vision.  Respiratory: Negative for cough, hemoptysis, sputum production, shortness of breath and wheezing.   Cardiovascular: Negative for chest pain, palpitations, orthopnea and leg swelling.  Skin: Negative for rash.  Neurological: Negative for headaches.  All other systems reviewed and are negative.    VS: BP 118/78 (BP Location: Left Arm, Cuff Size: Normal)   Pulse 68   Resp 16   Ht 5' (1.524 m)   Wt 90 lb (40.8 kg)   SpO2 100%   BMI 17.58 kg/m       PHYSICAL EXAM  Physical Exam  Constitutional: She is oriented to person, place, and time. No distress.  HENT:  Mouth/Throat: No oropharyngeal exudate.  Cardiovascular: Normal rate, regular rhythm and normal heart sounds.  No murmur heard. Pulmonary/Chest: Effort normal and breath sounds normal. No stridor. No respiratory distress. She has no wheezes. She has no rales.  Musculoskeletal: Normal range of motion. She exhibits no edema.  Neurological: She is alert and oriented to person, place, and time.  Skin: Skin is warm. She is not diaphoretic.  Psychiatric: She has a normal mood and affect.    Ct chest 06/07/16 images reviewed 11/17/2017  CXR 02/2017 progressive RUL opacity       ASSESSMENT/PLAN   83 yo white female seen today for abnormal CT chest and CXR and after further assessment and reviews of imaging studies,   patient has chronic bronchiectasis with intermittent  sputum production with previous bout of atypical pneumonia with what appears to be mucoid impaction in the past  Patient REFUSES BRONCHOSCOPY AT THIS TIME and DOE SNOT WANT ANY TYPE OF BIOPSY AT THIS TIME We discussed that her CT and CXR does NOT match with her clinical symptoms  She is very active and has no resp issues at this time I have explained that there is no reason to obtain CT chest if there is no plan for intervention. She agrees with me 100% and is very satisfied with plan of action.   Follow up in 6 months Patient  satisfied with Plan of action and management. All questions answered  Corrin Parker, M.D.  [facility]  Medical Director [place] Director [facility] Cardio-Pulmonary Department

## 2017-11-25 DIAGNOSIS — R5383 Other fatigue: Secondary | ICD-10-CM | POA: Diagnosis not present

## 2017-11-25 DIAGNOSIS — R636 Underweight: Secondary | ICD-10-CM | POA: Diagnosis not present

## 2017-11-25 DIAGNOSIS — Z5181 Encounter for therapeutic drug level monitoring: Secondary | ICD-10-CM | POA: Diagnosis not present

## 2017-11-25 DIAGNOSIS — F3175 Bipolar disorder, in partial remission, most recent episode depressed: Secondary | ICD-10-CM | POA: Diagnosis not present

## 2017-12-05 DIAGNOSIS — H40153 Residual stage of open-angle glaucoma, bilateral: Secondary | ICD-10-CM | POA: Diagnosis not present

## 2017-12-28 DIAGNOSIS — J300 Vasomotor rhinitis: Secondary | ICD-10-CM | POA: Diagnosis not present

## 2017-12-28 DIAGNOSIS — H6120 Impacted cerumen, unspecified ear: Secondary | ICD-10-CM | POA: Diagnosis not present

## 2017-12-28 DIAGNOSIS — J3489 Other specified disorders of nose and nasal sinuses: Secondary | ICD-10-CM | POA: Diagnosis not present

## 2017-12-28 DIAGNOSIS — H6123 Impacted cerumen, bilateral: Secondary | ICD-10-CM | POA: Diagnosis not present

## 2018-02-15 DIAGNOSIS — R002 Palpitations: Secondary | ICD-10-CM | POA: Diagnosis not present

## 2018-02-15 DIAGNOSIS — Z87898 Personal history of other specified conditions: Secondary | ICD-10-CM | POA: Diagnosis not present

## 2018-02-15 DIAGNOSIS — I10 Essential (primary) hypertension: Secondary | ICD-10-CM | POA: Diagnosis not present

## 2018-02-15 DIAGNOSIS — R0602 Shortness of breath: Secondary | ICD-10-CM | POA: Diagnosis not present

## 2018-03-08 DIAGNOSIS — H40153 Residual stage of open-angle glaucoma, bilateral: Secondary | ICD-10-CM | POA: Diagnosis not present

## 2018-04-12 DIAGNOSIS — M79671 Pain in right foot: Secondary | ICD-10-CM | POA: Diagnosis not present

## 2018-04-12 DIAGNOSIS — M722 Plantar fascial fibromatosis: Secondary | ICD-10-CM | POA: Diagnosis not present

## 2018-04-12 DIAGNOSIS — G5761 Lesion of plantar nerve, right lower limb: Secondary | ICD-10-CM | POA: Diagnosis not present

## 2018-05-18 DIAGNOSIS — Z1231 Encounter for screening mammogram for malignant neoplasm of breast: Secondary | ICD-10-CM | POA: Diagnosis not present

## 2018-05-24 DIAGNOSIS — L720 Epidermal cyst: Secondary | ICD-10-CM | POA: Diagnosis not present

## 2018-06-08 DIAGNOSIS — F3175 Bipolar disorder, in partial remission, most recent episode depressed: Secondary | ICD-10-CM | POA: Diagnosis not present

## 2018-06-15 ENCOUNTER — Ambulatory Visit (INDEPENDENT_AMBULATORY_CARE_PROVIDER_SITE_OTHER): Payer: Medicare Other | Admitting: Obstetrics and Gynecology

## 2018-06-15 ENCOUNTER — Encounter: Payer: Self-pay | Admitting: Obstetrics and Gynecology

## 2018-06-15 VITALS — BP 150/78 | HR 73 | Ht 60.0 in | Wt 86.5 lb

## 2018-06-15 DIAGNOSIS — Z01411 Encounter for gynecological examination (general) (routine) with abnormal findings: Secondary | ICD-10-CM | POA: Diagnosis not present

## 2018-06-15 DIAGNOSIS — R351 Nocturia: Secondary | ICD-10-CM

## 2018-06-15 DIAGNOSIS — Z780 Asymptomatic menopausal state: Secondary | ICD-10-CM | POA: Diagnosis not present

## 2018-06-15 DIAGNOSIS — Z90710 Acquired absence of both cervix and uterus: Secondary | ICD-10-CM

## 2018-06-15 DIAGNOSIS — R7303 Prediabetes: Secondary | ICD-10-CM | POA: Diagnosis not present

## 2018-06-15 DIAGNOSIS — R634 Abnormal weight loss: Secondary | ICD-10-CM

## 2018-06-15 DIAGNOSIS — Z681 Body mass index (BMI) 19 or less, adult: Secondary | ICD-10-CM | POA: Diagnosis not present

## 2018-06-15 DIAGNOSIS — Z1211 Encounter for screening for malignant neoplasm of colon: Secondary | ICD-10-CM

## 2018-06-15 DIAGNOSIS — R636 Underweight: Secondary | ICD-10-CM

## 2018-06-15 LAB — POCT URINALYSIS DIPSTICK
Bilirubin, UA: NEGATIVE
Blood, UA: NEGATIVE
GLUCOSE UA: NEGATIVE
Ketones, UA: NEGATIVE
Nitrite, UA: NEGATIVE
ODOR: NEGATIVE
Protein, UA: POSITIVE — AB
SPEC GRAV UA: 1.02 (ref 1.010–1.025)
Urobilinogen, UA: 0.2 E.U./dL
pH, UA: 6 (ref 5.0–8.0)

## 2018-06-15 NOTE — Patient Instructions (Addendum)
1.  No Pap smear is performed.  No further Pap smears are needed 2.  No further mammograms are recommended unless abnormality on exam is identified 3.  Stool guaiac card testing for colon cancer screening is not ordered. 4.  Screening labs are ordered. 5.  Continue with healthy eating and exercise 6.  Continue with calcium and vitamin D supplementation to help prevent osteoporosis 7.  Return in 1 year for annual exam 8.  Consider taking Megace for appetite stimulant to help gain weight  (MEGACE) megestrol tablets What is this medicine? MEGESTROL (me JES trol) belongs to a class of drugs known as progestins. Megestrol tablets are used to treat advanced breast or endometrial cancer. This medicine may be used for other purposes; ask your health care provider or pharmacist if you have questions. COMMON BRAND NAME(S): Megace What should I tell my health care provider before I take this medicine? They need to know if you have any of these conditions: -adrenal gland problems -history of blood clots of the legs, lungs, or other parts of the body -diabetes -kidney disease -liver disease -stroke -an unusual or allergic reaction to megestrol, other medicines, foods, dyes, or preservatives -pregnant or trying to get pregnant -breast-feeding How should I use this medicine? Take this medicine by mouth. Follow the directions on the prescription label. Do not take your medicine more often than directed. Take your doses at regular intervals. Do not stop taking except on the advice of your doctor or health care professional. Talk to your pediatrician regarding the use of this medicine in children. Special care may be needed. Overdosage: If you think you have taken too much of this medicine contact a poison control center or emergency room at once. NOTE: This medicine is only for you. Do not share this medicine with others. What if I miss a dose? If you miss a dose, take it as soon as you can. If it is  almost time for your next dose, take only that dose. Do not take double or extra doses. What may interact with this medicine? Do not take this medicine with any of the following medications: -dofetilide This medicine may also interact with the following medications: -carbamazepine -indinavir -phenobarbital -phenytoin -primidone -rifampin -warfarin This list may not describe all possible interactions. Give your health care provider a list of all the medicines, herbs, non-prescription drugs, or dietary supplements you use. Also tell them if you smoke, drink alcohol, or use illegal drugs. Some items may interact with your medicine. What should I watch for while using this medicine? Visit your doctor or health care professional for regular checks on your progress. Continue taking this medicine even if you feel better. It may take 2 months of regular use before you know if this medicine is working for your condition. If you are a female of child-bearing age, use an effective method of birth control while you are taking this medicine. This medicine should not be used by females who are pregnant or breast-feeding. There is a potential for serious side effects to an unborn child or to an infant. Talk to your health care professional or pharmacist for more information. If you have diabetes, this medicine may affect blood sugar levels. Check your blood sugar and talk to your doctor or health care professional if you notice changes. What side effects may I notice from receiving this medicine? Side effects that you should report to your doctor or health care professional as soon as possible: -difficulty breathing or shortness  of breath -chest pain -dizziness -fluid retention -increased blood pressure -leg pain or swelling -nausea and vomiting -skin rash or itching -weakness Side effects that usually do not require medical attention (report to your doctor or health care professional if they continue  or are bothersome): -breakthrough menstrual bleeding -hot flashes or flushing -increased appetite -mood changes -sweating -weight gain This list may not describe all possible side effects. Call your doctor for medical advice about side effects. You may report side effects to FDA at 1-800-FDA-1088. Where should I keep my medicine? Keep out of the reach of children. Store at controlled room temperature between 15 and 30 degrees C (59 and 86 degrees F). Protect from heat above 40 degrees C (104 degrees F). Throw away any unused medicine after the expiration date. NOTE: This sheet is a summary. It may not cover all possible information. If you have questions about this medicine, talk to your doctor, pharmacist, or health care provider.  2018 Elsevier/Gold Standard (2008-06-24 15:57:10)    Health Maintenance for Postmenopausal Women Menopause is a normal process in which your reproductive ability comes to an end. This process happens gradually over a span of months to years, usually between the ages of 61 and 44. Menopause is complete when you have missed 12 consecutive menstrual periods. It is important to talk with your health care provider about some of the most common conditions that affect postmenopausal women, such as heart disease, cancer, and bone loss (osteoporosis). Adopting a healthy lifestyle and getting preventive care can help to promote your health and wellness. Those actions can also lower your chances of developing some of these common conditions. What should I know about menopause? During menopause, you may experience a number of symptoms, such as:  Moderate-to-severe hot flashes.  Night sweats.  Decrease in sex drive.  Mood swings.  Headaches.  Tiredness.  Irritability.  Memory problems.  Insomnia.  Choosing to treat or not to treat menopausal changes is an individual decision that you make with your health care provider. What should I know about hormone  replacement therapy and supplements? Hormone therapy products are effective for treating symptoms that are associated with menopause, such as hot flashes and night sweats. Hormone replacement carries certain risks, especially as you become older. If you are thinking about using estrogen or estrogen with progestin treatments, discuss the benefits and risks with your health care provider. What should I know about heart disease and stroke? Heart disease, heart attack, and stroke become more likely as you age. This may be due, in part, to the hormonal changes that your body experiences during menopause. These can affect how your body processes dietary fats, triglycerides, and cholesterol. Heart attack and stroke are both medical emergencies. There are many things that you can do to help prevent heart disease and stroke:  Have your blood pressure checked at least every 1-2 years. High blood pressure causes heart disease and increases the risk of stroke.  If you are 91-59 years old, ask your health care provider if you should take aspirin to prevent a heart attack or a stroke.  Do not use any tobacco products, including cigarettes, chewing tobacco, or electronic cigarettes. If you need help quitting, ask your health care provider.  It is important to eat a healthy diet and maintain a healthy weight. ? Be sure to include plenty of vegetables, fruits, low-fat dairy products, and lean protein. ? Avoid eating foods that are high in solid fats, added sugars, or salt (sodium).  Get  regular exercise. This is one of the most important things that you can do for your health. ? Try to exercise for at least 150 minutes each week. The type of exercise that you do should increase your heart rate and make you sweat. This is known as moderate-intensity exercise. ? Try to do strengthening exercises at least twice each week. Do these in addition to the moderate-intensity exercise.  Know your numbers.Ask your health  care provider to check your cholesterol and your blood glucose. Continue to have your blood tested as directed by your health care provider.  What should I know about cancer screening? There are several types of cancer. Take the following steps to reduce your risk and to catch any cancer development as early as possible. Breast Cancer  Practice breast self-awareness. ? This means understanding how your breasts normally appear and feel. ? It also means doing regular breast self-exams. Let your health care provider know about any changes, no matter how small.  If you are 16 or older, have a clinician do a breast exam (clinical breast exam or CBE) every year. Depending on your age, family history, and medical history, it may be recommended that you also have a yearly breast X-ray (mammogram).  If you have a family history of breast cancer, talk with your health care provider about genetic screening.  If you are at high risk for breast cancer, talk with your health care provider about having an MRI and a mammogram every year.  Breast cancer (BRCA) gene test is recommended for women who have family members with BRCA-related cancers. Results of the assessment will determine the need for genetic counseling and BRCA1 and for BRCA2 testing. BRCA-related cancers include these types: ? Breast. This occurs in males or females. ? Ovarian. ? Tubal. This may also be called fallopian tube cancer. ? Cancer of the abdominal or pelvic lining (peritoneal cancer). ? Prostate. ? Pancreatic.  Cervical, Uterine, and Ovarian Cancer Your health care provider may recommend that you be screened regularly for cancer of the pelvic organs. These include your ovaries, uterus, and vagina. This screening involves a pelvic exam, which includes checking for microscopic changes to the surface of your cervix (Pap test).  For women ages 21-65, health care providers may recommend a pelvic exam and a Pap test every three years.  For women ages 14-65, they may recommend the Pap test and pelvic exam, combined with testing for human papilloma virus (HPV), every five years. Some types of HPV increase your risk of cervical cancer. Testing for HPV may also be done on women of any age who have unclear Pap test results.  Other health care providers may not recommend any screening for nonpregnant women who are considered low risk for pelvic cancer and have no symptoms. Ask your health care provider if a screening pelvic exam is right for you.  If you have had past treatment for cervical cancer or a condition that could lead to cancer, you need Pap tests and screening for cancer for at least 20 years after your treatment. If Pap tests have been discontinued for you, your risk factors (such as having a new sexual partner) need to be reassessed to determine if you should start having screenings again. Some women have medical problems that increase the chance of getting cervical cancer. In these cases, your health care provider may recommend that you have screening and Pap tests more often.  If you have a family history of uterine cancer or ovarian cancer,  talk with your health care provider about genetic screening.  If you have vaginal bleeding after reaching menopause, tell your health care provider.  There are currently no reliable tests available to screen for ovarian cancer.  Lung Cancer Lung cancer screening is recommended for adults 22-44 years old who are at high risk for lung cancer because of a history of smoking. A yearly low-dose CT scan of the lungs is recommended if you:  Currently smoke.  Have a history of at least 30 pack-years of smoking and you currently smoke or have quit within the past 15 years. A pack-year is smoking an average of one pack of cigarettes per day for one year.  Yearly screening should:  Continue until it has been 15 years since you quit.  Stop if you develop a health problem that would prevent  you from having lung cancer treatment.  Colorectal Cancer  This type of cancer can be detected and can often be prevented.  Routine colorectal cancer screening usually begins at age 53 and continues through age 56.  If you have risk factors for colon cancer, your health care provider may recommend that you be screened at an earlier age.  If you have a family history of colorectal cancer, talk with your health care provider about genetic screening.  Your health care provider may also recommend using home test kits to check for hidden blood in your stool.  A small camera at the end of a tube can be used to examine your colon directly (sigmoidoscopy or colonoscopy). This is done to check for the earliest forms of colorectal cancer.  Direct examination of the colon should be repeated every 5-10 years until age 12. However, if early forms of precancerous polyps or small growths are found or if you have a family history or genetic risk for colorectal cancer, you may need to be screened more often.  Skin Cancer  Check your skin from head to toe regularly.  Monitor any moles. Be sure to tell your health care provider: ? About any new moles or changes in moles, especially if there is a change in a mole's shape or color. ? If you have a mole that is larger than the size of a pencil eraser.  If any of your family members has a history of skin cancer, especially at a young age, talk with your health care provider about genetic screening.  Always use sunscreen. Apply sunscreen liberally and repeatedly throughout the day.  Whenever you are outside, protect yourself by wearing long sleeves, pants, a wide-brimmed hat, and sunglasses.  What should I know about osteoporosis? Osteoporosis is a condition in which bone destruction happens more quickly than new bone creation. After menopause, you may be at an increased risk for osteoporosis. To help prevent osteoporosis or the bone fractures that can  happen because of osteoporosis, the following is recommended:  If you are 53-53 years old, get at least 1,000 mg of calcium and at least 600 mg of vitamin D per day.  If you are older than age 41 but younger than age 1, get at least 1,200 mg of calcium and at least 600 mg of vitamin D per day.  If you are older than age 49, get at least 1,200 mg of calcium and at least 800 mg of vitamin D per day.  Smoking and excessive alcohol intake increase the risk of osteoporosis. Eat foods that are rich in calcium and vitamin D, and do weight-bearing exercises several times each  week as directed by your health care provider. What should I know about how menopause affects my mental health? Depression may occur at any age, but it is more common as you become older. Common symptoms of depression include:  Low or sad mood.  Changes in sleep patterns.  Changes in appetite or eating patterns.  Feeling an overall lack of motivation or enjoyment of activities that you previously enjoyed.  Frequent crying spells.  Talk with your health care provider if you think that you are experiencing depression. What should I know about immunizations? It is important that you get and maintain your immunizations. These include:  Tetanus, diphtheria, and pertussis (Tdap) booster vaccine.  Influenza every year before the flu season begins.  Pneumonia vaccine.  Shingles vaccine.  Your health care provider may also recommend other immunizations. This information is not intended to replace advice given to you by your health care provider. Make sure you discuss any questions you have with your health care provider. Document Released: 01/28/2006 Document Revised: 06/25/2016 Document Reviewed: 09/09/2015 Elsevier Interactive Patient Education  2018 [employer].

## 2018-06-15 NOTE — Progress Notes (Signed)
Patient ID: Sierra Benson, female   DOB: 21-Nov-1929, 84 y.o.   MRN: [id number] ANNUAL PREVENTATIVE CARE GYN  ENCOUNTER NOTE  Subjective:       Sierra Benson is a 84 y.o. G45P3003 female here for a routine annual gynecologic exam.  Current complaints:  1. Weight loss-patient is down to 86.5 pounds.  She sees Dr. Ubaldo Glassing of cardiology.  She sees Dr. Mortimer Fries of pulmonary; chronic right lung abnormality on CT scan is being followed; bronchoscopy is not encouraged because of age and potential comorbidities and risk; patient is contemplating PET scan; she does not report any major lung symptoms other than an occasional cough which is not increasing; she does not report any significant night sweats, nausea, vomiting, abdominal pain, change in bowel function.  Upon further discussion with patient, she does not believe that she would perform any major interventions if cancer was identified of the lung, colon, ovary, breast.  We discussed routine screening tests and pros and cons of continuing with such.  At this time I am not recommending annual screening unless patient develops symptomatology specific to a particular organ system.  We discussed appetite stimulants and have suggested megestrol; literature regarding megestrol is given to patient.    Gynecologic History No LMP recorded. Patient has had a hysterectomy. Contraception: status post hysterectomy Last Pap: 10/23/2014 pap reflx neg. Results were: normal Last mammogram: BIBC- 2019 wnl Results were: normal  Obstetric History OB History  Gravida Para Term Preterm AB Living  3 3 3     3   SAB TAB Ectopic Multiple Live Births          3    # Outcome Date GA Lbr Len/2nd Weight Sex Delivery Anes PTL Lv  3 Term 1961   7 lb 4.8 oz (3.311 kg) F VBAC   LIV  2 Term 1956   6 lb 8 oz (2.948 kg) M Vag-Spont   LIV  1 Term 1954   6 lb 4.8 oz (2.858 kg) M Vag-Spont   LIV    Past Medical History:  Diagnosis Date  . Bacterial pneumonia 11/2015  .  Cystocele   . Glaucoma   . Insomnia   . Menopausal state   . Mitral valve disorder   . Nocturia   . Prediabetes 06/19/2015  . Rectocele   . Vaginal atrophy   . Varicose veins   . Vitamin D deficiency     Past Surgical History:  Procedure Laterality Date  . BREAST BIOPSY Right    benign nodule  . EYE SURGERY Left    cataract removed  . FOOT SURGERY    . VAGINAL HYSTERECTOMY     menorrhagia    Current Outpatient Medications on File Prior to Visit  Medication Sig Dispense Refill  . bimatoprost (LUMIGAN) 0.01 % SOLN Place 1 drop into both eyes at bedtime.     Marland Kitchen lithium 300 MG tablet Take 1 tablet (300 mg total) by mouth at bedtime. 30 tablet 2  . magnesium oxide (MAG-OX) 400 MG tablet Take 400 mg by mouth daily.    . Multiple Vitamins-Minerals (MULTIVITAMIN ADULT PO) Take 1 tablet by mouth daily.     Marland Kitchen Respiratory Therapy Supplies (FLUTTER) DEVI Use 10-15 times daily 1 each 0  . timolol (TIMOPTIC) 0.25 % ophthalmic solution 1 drop 2 (two) times daily.     No current facility-administered medications on file prior to visit.     Allergies  Allergen Reactions  . Propoxyphene Hives  . Celecoxib  Nausea And Vomiting  . Ibuprofen Other (See Comments)  . Prednisone Other (See Comments)    Prednisone Intensol    Social History   Socioeconomic History  . Marital status: Married    Spouse name: Not on file  . Number of children: Not on file  . Years of education: Not on file  . Highest education level: Not on file  Occupational History  . Not on file  Social Needs  . Financial resource strain: Not on file  . Food insecurity:    Worry: Not on file    Inability: Not on file  . Transportation needs:    Medical: Not on file    Non-medical: Not on file  Tobacco Use  . Smoking status: Never Smoker  . Smokeless tobacco: Never Used  Substance and Sexual Activity  . Alcohol use: No  . Drug use: No  . Sexual activity: Yes    Birth control/protection: Surgical  Lifestyle   . Physical activity:    Days per week: Not on file    Minutes per session: Not on file  . Stress: Not on file  Relationships  . Social connections:    Talks on phone: Not on file    Gets together: Not on file    Attends religious service: Not on file    Active member of club or organization: Not on file    Attends meetings of clubs or organizations: Not on file    Relationship status: Not on file  . Intimate partner violence:    Fear of current or ex partner: Not on file    Emotionally abused: Not on file    Physically abused: Not on file    Forced sexual activity: Not on file  Other Topics Concern  . Not on file  Social History Narrative  . Not on file    Family History  Problem Relation Age of Onset  . Diabetes Brother   . Lung cancer Brother   . Breast cancer Sister   . Ovarian cancer Neg Hx   . Colon cancer Neg Hx   . Heart disease Neg Hx     The following portions of the patient's history were reviewed and updated as appropriate: allergies, current medications, past family history, past medical history, past social history, past surgical history and problem list.  Review of Systems Review of Systems  Constitutional: Positive for weight loss. Negative for chills, diaphoresis and fever.  HENT: Negative.   Eyes: Negative.   Respiratory: Positive for cough. Negative for sputum production, shortness of breath and wheezing.   Cardiovascular: Positive for leg swelling. Negative for chest pain, palpitations and orthopnea.  Gastrointestinal: Negative for abdominal pain, blood in stool, constipation, diarrhea, melena, nausea and vomiting.  Genitourinary: Negative.   Musculoskeletal: Negative.   Skin: Negative.   Neurological: Negative.   Endo/Heme/Allergies: Negative.   Psychiatric/Behavioral: Negative.     Objective:  BP (!) 150/78   Pulse 73   Ht 5' (1.524 m)   Wt 86 lb 8 oz (39.2 kg)   BMI 16.89 kg/m    CONSTITUTIONAL: Thin  female in no acute distress.   PSYCHIATRIC: Normal mood and affect. Normal behavior. Normal judgment and thought content. [place]: Alert and oriented to person, place, and time. Normal muscle tone coordination. No cranial nerve deficit noted. HENT:  Normocephalic, atraumatic, External right and left ear normal.  EYES: Conjunctivae and EOM are normal. Pupils are equal, round, and reactive to light. No scleral icterus.  NECK: Normal  range of motion, supple, no masses.  Normal thyroid.  SKIN: Skin is warm and dry. No rash noted. Not diaphoretic. No erythema. No pallor. CARDIOVASCULAR: Mild tachycardia, regular rhythm, no murmur. RESPIRATORY: Clear to auscultation bilaterally. Effort and breath sounds normal, no problems with respiration noted. BREASTS: Symmetric in size. No masses, skin changes, nipple drainage, or lymphadenopathy. ABDOMEN: Soft, normal bowel sounds, no distention noted.  No tenderness, rebound or guarding.  BLADDER: Normal PELVIC:  External Genitalia: Normal  BUS: Normal  Vagina: Moderate to severe atrophy; good support of the vaginal vault; single digit exam performed  Cervix: Surgically absent  Uterus: Surgically absent  Adnexa: Normal; no palpable abnormalities/tenderness  RV: External Exam NormaI, No Rectal Masses and Normal Sphincter tone  MUSCULOSKELETAL: Normal range of motion. No tenderness.  No cyanosis, clubbing, or edema.  2+ distal pulses. LYMPHATIC: No Axillary, Supraclavicular, or Inguinal Adenopathy.    Assessment:   Annual gynecologic examination 84 y.o. Contraception: status post hysterectomy Under weight   Plan:  Pap: Not needed Mammogram: utd. Will defer future mammograms unless abnormal findings are identified on exam Stool Guaiac Testing:  Not Ordered Labs: a1c fbs lipid tsh Routine preventative health maintenance measures emphasized: Exercise/Diet/Weight control, Tobacco Warnings, Alcohol/Substance use risks and Safe Sex Lubricants for intercourse as needed Continue  calcium with vitamin D supplementation and weightbearing exercise Consider megestrol acetate as appetite stimulant  Return to [place], CMA  Brayton Mars, MD  Note: This dictation was prepared with Dragon dictation along with smaller phrase technology. Any transcriptional errors that result from this process are unintentional.

## 2018-06-17 LAB — URINE CULTURE: Organism ID, Bacteria: NO GROWTH

## 2018-06-23 ENCOUNTER — Emergency Department (HOSPITAL_COMMUNITY): Payer: Medicare Other

## 2018-06-23 ENCOUNTER — Encounter (HOSPITAL_COMMUNITY): Payer: Self-pay

## 2018-06-23 ENCOUNTER — Other Ambulatory Visit: Payer: Self-pay

## 2018-06-23 ENCOUNTER — Emergency Department (HOSPITAL_COMMUNITY)
Admission: EM | Admit: 2018-06-23 | Discharge: 2018-06-23 | Disposition: A | Discharge status: Home / Self Care | Payer: Medicare Other | Attending: Emergency Medicine | Admitting: Emergency Medicine

## 2018-06-23 DIAGNOSIS — Z79899 Other long term (current) drug therapy: Secondary | ICD-10-CM | POA: Diagnosis not present

## 2018-06-23 DIAGNOSIS — W01198A Fall on same level from slipping, tripping and stumbling with subsequent striking against other object, initial encounter: Secondary | ICD-10-CM | POA: Insufficient documentation

## 2018-06-23 DIAGNOSIS — S51811A Laceration without foreign body of right forearm, initial encounter: Secondary | ICD-10-CM | POA: Diagnosis not present

## 2018-06-23 DIAGNOSIS — M79601 Pain in right arm: Secondary | ICD-10-CM | POA: Diagnosis not present

## 2018-06-23 DIAGNOSIS — Y929 Unspecified place or not applicable: Secondary | ICD-10-CM | POA: Insufficient documentation

## 2018-06-23 DIAGNOSIS — Y990 Civilian activity done for income or pay: Secondary | ICD-10-CM | POA: Diagnosis not present

## 2018-06-23 DIAGNOSIS — Z23 Encounter for immunization: Secondary | ICD-10-CM | POA: Diagnosis not present

## 2018-06-23 DIAGNOSIS — S41101A Unspecified open wound of right upper arm, initial encounter: Secondary | ICD-10-CM | POA: Diagnosis not present

## 2018-06-23 DIAGNOSIS — Y939 Activity, unspecified: Secondary | ICD-10-CM | POA: Insufficient documentation

## 2018-06-23 DIAGNOSIS — W19XXXA Unspecified fall, initial encounter: Secondary | ICD-10-CM | POA: Diagnosis not present

## 2018-06-23 MED ORDER — LIDOCAINE HCL (PF) 1 % IJ SOLN
30.0000 mL | Freq: Once | INTRAMUSCULAR | Status: AC
  Administered 2018-06-23: 30 mL
  Filled 2018-06-23: qty 30

## 2018-06-23 MED ORDER — CEFAZOLIN SODIUM-DEXTROSE 1-4 GM/50ML-% IV SOLN
1.0000 g | Freq: Once | INTRAVENOUS | Status: AC
  Administered 2018-06-23: 1 g via INTRAVENOUS
  Filled 2018-06-23: qty 50

## 2018-06-23 MED ORDER — CEPHALEXIN 250 MG PO CAPS: 250.0000 mg | ORAL_CAPSULE | Freq: Four times a day (QID) | ORAL | 0 refills | Status: DC

## 2018-06-23 MED ORDER — TETANUS-DIPHTH-ACELL PERTUSSIS 5-2.5-18.5 LF-MCG/0.5 IM SUSP
0.5000 mL | Freq: Once | INTRAMUSCULAR | Status: AC
  Administered 2018-06-23: 0.5 mL via INTRAMUSCULAR
  Filled 2018-06-23: qty 0.5

## 2018-06-23 NOTE — ED Triage Notes (Signed)
Pt had mechanical fall at work; hit corner of drawer w/ R arm, partial degloving of R arm; no LOC, no neck pain, no back pain  166/72  HR 88 98% RA

## 2018-06-23 NOTE — ED Notes (Signed)
R hand and arm wrapped with xeroform gauze, telfa pad, and curlex

## 2018-06-23 NOTE — ED Provider Notes (Signed)
[place] EMERGENCY DEPARTMENT Provider Note   CSN: [id number] Arrival date & time: 06/23/18  1655     History   Chief Complaint No chief complaint on file.   HPI Sierra Benson is a 84 y.o. female.  She complains of a mechanical trip and fall when she tripped over something while at work and landed on her right forearm to the carpet.  She denies any loss of consciousness no head pain no neck pain no back pain no chest pain no shortness of breath.  She has a large laceration to her right proximal forearm she says that causes her a little bit of pain with movement.  There is no associated numbness or weakness.  This occurred just prior to arrival.  The history is provided by the patient.  Fall  This is a new problem. The current episode started less than 1 hour ago. The problem occurs constantly. The problem has not changed since onset.Pertinent negatives include no chest pain, no abdominal pain, no headaches and no shortness of breath. Nothing aggravates the symptoms. Nothing relieves the symptoms. She has tried nothing for the symptoms. The treatment provided no relief.    Past Medical History:  Diagnosis Date  . Bacterial pneumonia 11/2015  . Cystocele   . Glaucoma   . Insomnia   . Menopausal state   . Mitral valve disorder   . Nocturia   . Prediabetes 06/19/2015  . Rectocele   . Vaginal atrophy   . Varicose veins   . Vitamin D deficiency     Patient Active Problem List   Diagnosis Date Noted  . Crushing injury of finger 06/10/2017  . Mucoid impaction of bronchi 06/24/2016  . Pseudophakia of left eye 04/27/2016  . Bipolar 1 disorder, depressed, moderate ([place])   . Right lower quadrant pain 10/29/2015  . Hemorrhage of anus and rectum 10/29/2015  . History of palpitations 06/19/2015  . BP (high blood pressure) 06/19/2015  . Underweight due to inadequate caloric intake 06/19/2015  . Prediabetes 06/19/2015  . Primary open angle glaucoma of left eye,  severe stage 01/09/2015  . 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 Cataract 06/27/2012    Past Surgical History:  Procedure Laterality Date  . BREAST BIOPSY Right    benign nodule  . EYE SURGERY Left    cataract removed  . FOOT SURGERY    . VAGINAL HYSTERECTOMY     menorrhagia     OB History    Gravida  3   Para  3   Term  3   Preterm      AB      Living  3     SAB      TAB      Ectopic      Multiple      Live Births  3            Home Medications    Prior to Admission medications   Medication Sig Start Date End Date Taking? Authorizing Provider  bimatoprost (LUMIGAN) 0.01 % SOLN Place 1 drop into both eyes at bedtime.     [provider]  lithium 300 MG tablet Take 1 tablet (300 mg total) by mouth at bedtime. 12/10/15 06/15/18  Earleen Newport, MD  magnesium oxide (MAG-OX) 400 MG tablet Take 400 mg by mouth daily.    [provider]  Multiple Vitamins-Minerals (MULTIVITAMIN ADULT PO) Take 1 tablet by mouth daily.     [provider]  Respiratory Therapy Supplies (FLUTTER) DEVI Use 10-15 times daily 10/20/17   Wilhelmina Mcardle, MD  timolol (TIMOPTIC) 0.25 % ophthalmic solution 1 drop 2 (two) times daily.    [provider]    Family History Family History  Problem Relation Age of Onset  . Diabetes Brother   . Lung cancer Brother   . Breast cancer Sister   . Ovarian cancer Neg Hx   . Colon cancer Neg Hx   . Heart disease Neg Hx     Social History Social History   Tobacco Use  . Smoking status: Never Smoker  . Smokeless tobacco: Never Used  Substance Use Topics  . Alcohol use: No  . Drug use: No     Allergies   Propoxyphene; Celecoxib; Ibuprofen; and Prednisone   Review of Systems Review of Systems  Constitutional: Negative for fever.  HENT: Negative for sore throat.   Eyes: Negative for visual disturbance.  Respiratory: Negative for shortness of breath.   Cardiovascular: Negative for  chest pain.  Gastrointestinal: Negative for abdominal pain.  Genitourinary: Negative for dysuria.  Skin: Positive for wound. Negative for rash.  Neurological: Negative for headaches.     Physical Exam Updated Vital Signs BP (!) 176/82 (BP Location: Left Arm)   Pulse 87   Temp 98.6 F (37 C) (Oral)   Resp 16   Ht 5' (1.524 m)   Wt 39 kg (86 lb)   SpO2 97%   BMI 16.80 kg/m   Physical Exam  Constitutional: She appears well-developed and well-nourished.  HENT:  Head: Normocephalic and atraumatic.  Eyes: Conjunctivae are normal.  Neck: Neck supple.  Cardiovascular: Normal rate, regular rhythm, normal heart sounds and intact distal pulses.  Pulmonary/Chest: Effort normal and breath sounds normal.  Abdominal: Soft. She exhibits no distension. There is no tenderness. There is no guarding.  Musculoskeletal: Normal range of motion.  Patient has a large jagged V-shaped laceration over her proximal left forearm with exposed muscle belly and extensor tendon.  The does not appear to be an also obvious deficit although she has pain with range of motion at her wrist.  Her radian ulnar median motor and sensory are intact.  Neurological: She is alert. GCS eye subscore is 4. GCS verbal subscore is 5. GCS motor subscore is 6.  Skin: Skin is warm and dry.  Psychiatric: She has a normal mood and affect.     ED Treatments / Results  Labs (all labs ordered are listed, but only abnormal results are displayed) Labs Reviewed - No data to display  EKG None  Radiology No results found.  Procedures .Marland KitchenLaceration Repair Date/Time: 06/23/2018 6:36 PM Performed by: Hayden Rasmussen, MD Authorized by: Hayden Rasmussen, MD   Consent:    Consent obtained:  Verbal   Consent given by:  Patient   Risks discussed:  Infection, pain, poor cosmetic result, poor wound healing and retained foreign body   Alternatives discussed:  No treatment and delayed treatment Anesthesia (see MAR for exact dosages):     Anesthesia method:  Local infiltration   Local anesthetic:  Lidocaine 1% w/o epi Laceration details:    Location:  Shoulder/arm   Shoulder/arm location:  R lower arm   Length (cm):  15 Repair type:    Repair type:  Simple Pre-procedure details:    Preparation:  Patient was prepped and draped in usual sterile fashion Exploration:    Wound extent: muscle damage and tendon damage (possible)     Tendon damage  location:  Upper extremity   Upper extremity tendon damage location:  Forearm extensor   Tendon repair plan:  Refer for evaluation   Contaminated: no   Treatment:    Area cleansed with:  Saline   Irrigation solution:  Sterile saline Skin repair:    Repair method:  Sutures   Suture size:  4-0   Suture material:  Nylon   Suture technique:  Simple interrupted   Number of sutures:  15 Approximation:    Approximation:  Loose Post-procedure details:    Dressing:  Non-adherent dressing and bulky dressing   Patient tolerance of procedure:  Tolerated well, no immediate complications Comments:     Is not clear by my exam that any tendon is been disrupted but they are clearly visible and she has some muscle belly that also looks violated but I do not have a true deficit on her motor exam.  I reviewed this with Dr. Lelon Huh who recommended close the skin and have her follow-up as an outpatient.  I gave her a dose of IV antibiotics and updated her tetanus.   (including critical care time)    Medications Ordered in ED Medications  Tdap (BOOSTRIX) injection 0.5 mL (has no administration in time range)  ceFAZolin (ANCEF) IVPB 1 g/50 mL premix (has no administration in time range)     Initial Impression / Assessment and Plan / ED Course  I have reviewed the triage vital signs and the nursing notes.  Pertinent labs & imaging results that were available during my care of the patient were reviewed by me and considered in my medical decision making (see chart for details).  Clinical  Course as of Jun 23 1746  Fri Jun 23, 2018  1725 Patient with an extensive laceration and open muscle belly.  I have paged hand on-call and we are giving her a tetanus update and antibiotics.   [MB]  [id number] Discussed with hand Bobette Mo on-call Dr. Stann Mainland who recommends closing the wound as best as possible and Xeroform and having a follow-up with plastic surgery due to the potential of may need a skin graft.   [MB]    Clinical Course User Index [MB] Hayden Rasmussen, MD     Final Clinical Impressions(s) / ED Diagnoses   Final diagnoses:  Forearm laceration, right, initial encounter    ED Discharge Orders        Ordered    cephALEXin (KEFLEX) 250 MG capsule  4 times daily     06/23/18 1855       Hayden Rasmussen, MD 06/24/18 [phone]

## 2018-06-23 NOTE — ED Notes (Signed)
Pt verbalizes understanding of d/c instructions. Pt received prescriptions. Pt ambulatory at d/c with all belongings and with family.

## 2018-06-23 NOTE — ED Notes (Signed)
Pt ambulated to restroom with stand by assistance

## 2018-06-23 NOTE — Discharge Instructions (Addendum)
You were seen in the emergency department for an extensive laceration of your right forearm.  There is possibly damage to structures underneath the skin and will be important that you follow-up with both the hand surgeon and possibly plastic surgery.  We are prescribing you an antibiotic to take and you can use Tylenol or ibuprofen as tolerated for pain.  Soap and water to the area and return if any obvious signs of infection.  Will also need to have the sutures taken out in 10 to 12 days.

## 2018-07-05 DIAGNOSIS — S41101A Unspecified open wound of right upper arm, initial encounter: Secondary | ICD-10-CM | POA: Diagnosis not present

## 2018-07-14 DIAGNOSIS — N390 Urinary tract infection, site not specified: Secondary | ICD-10-CM | POA: Diagnosis not present

## 2018-07-14 DIAGNOSIS — R300 Dysuria: Secondary | ICD-10-CM | POA: Diagnosis not present

## 2018-07-14 DIAGNOSIS — R319 Hematuria, unspecified: Secondary | ICD-10-CM | POA: Diagnosis not present

## 2018-07-19 DIAGNOSIS — H40153 Residual stage of open-angle glaucoma, bilateral: Secondary | ICD-10-CM | POA: Diagnosis not present

## 2018-07-26 ENCOUNTER — Telehealth: Payer: Self-pay | Admitting: Obstetrics and Gynecology

## 2018-07-26 ENCOUNTER — Other Ambulatory Visit: Payer: Medicare Other

## 2018-07-26 DIAGNOSIS — R636 Underweight: Secondary | ICD-10-CM | POA: Diagnosis not present

## 2018-07-26 DIAGNOSIS — R7303 Prediabetes: Secondary | ICD-10-CM | POA: Diagnosis not present

## 2018-07-26 DIAGNOSIS — J471 Bronchiectasis with (acute) exacerbation: Secondary | ICD-10-CM

## 2018-07-26 NOTE — Telephone Encounter (Signed)
Pt would like to be referred to [facility] pulmonary for her bronchiectasis. She has seen Dr. Mortimer Fries at [facility] in the past  but her family prefers her to be seen at [facility]. Referral placed.

## 2018-07-26 NOTE — Telephone Encounter (Signed)
LMTRC

## 2018-07-26 NOTE — Addendum Note (Signed)
Addended by: Elouise Munroe on: 07/26/2018 02:44 PM   Modules accepted: Orders

## 2018-07-26 NOTE — Telephone Encounter (Signed)
The patient came into the office for bloodwork and stated that she needs a referral to be put in the "Pulmonary Department" at [facility]. The patient spoke with an individual Eustace Pen and informed her a referral needed to be put in. The patient provided a direct contact # for that office [phone]) [phone], and The fax # is [phone]. Please advise.

## 2018-07-27 LAB — LIPID PANEL
CHOL/HDL RATIO: 2.6 ratio (ref 0.0–4.4)
Cholesterol, Total: 148 mg/dL (ref 100–199)
HDL: 56 mg/dL (ref >39)
LDL Calculated: 75 mg/dL (ref 0–99)
TRIGLYCERIDES: 85 mg/dL (ref 0–149)
VLDL CHOLESTEROL CAL: 17 mg/dL (ref 5–40)

## 2018-07-27 LAB — TSH: TSH: 2.08 u[IU]/mL (ref 0.450–4.500)

## 2018-07-27 LAB — GLUCOSE, RANDOM: Glucose: 106 mg/dL — ABNORMAL HIGH (ref 65–99)

## 2018-07-27 LAB — HEMOGLOBIN A1C
Est. average glucose Bld gHb Est-mCnc: 114 mg/dL
Hgb A1c MFr Bld: 5.6 % (ref 4.8–5.6)

## 2018-08-10 DIAGNOSIS — H40153 Residual stage of open-angle glaucoma, bilateral: Secondary | ICD-10-CM | POA: Diagnosis not present

## 2018-08-15 DIAGNOSIS — R002 Palpitations: Secondary | ICD-10-CM | POA: Diagnosis not present

## 2018-08-15 DIAGNOSIS — I340 Nonrheumatic mitral (valve) insufficiency: Secondary | ICD-10-CM | POA: Diagnosis not present

## 2018-08-15 DIAGNOSIS — Z87898 Personal history of other specified conditions: Secondary | ICD-10-CM | POA: Diagnosis not present

## 2018-08-18 DIAGNOSIS — Z961 Presence of intraocular lens: Secondary | ICD-10-CM | POA: Diagnosis not present

## 2018-08-18 DIAGNOSIS — H2511 Age-related nuclear cataract, right eye: Secondary | ICD-10-CM | POA: Diagnosis not present

## 2018-08-18 DIAGNOSIS — H401433 Capsular glaucoma with pseudoexfoliation of lens, bilateral, severe stage: Secondary | ICD-10-CM | POA: Diagnosis not present

## 2018-08-22 DIAGNOSIS — M2011 Hallux valgus (acquired), right foot: Secondary | ICD-10-CM | POA: Diagnosis not present

## 2018-08-22 DIAGNOSIS — D2371 Other benign neoplasm of skin of right lower limb, including hip: Secondary | ICD-10-CM | POA: Diagnosis not present

## 2018-08-22 DIAGNOSIS — M7741 Metatarsalgia, right foot: Secondary | ICD-10-CM | POA: Diagnosis not present

## 2018-08-22 DIAGNOSIS — M79671 Pain in right foot: Secondary | ICD-10-CM | POA: Diagnosis not present

## 2018-09-07 DIAGNOSIS — R05 Cough: Secondary | ICD-10-CM | POA: Diagnosis not present

## 2018-09-07 DIAGNOSIS — R918 Other nonspecific abnormal finding of lung field: Secondary | ICD-10-CM | POA: Diagnosis not present

## 2018-09-07 DIAGNOSIS — J479 Bronchiectasis, uncomplicated: Secondary | ICD-10-CM | POA: Diagnosis not present

## 2018-09-14 DIAGNOSIS — J479 Bronchiectasis, uncomplicated: Secondary | ICD-10-CM | POA: Diagnosis not present

## 2018-09-14 DIAGNOSIS — R918 Other nonspecific abnormal finding of lung field: Secondary | ICD-10-CM | POA: Diagnosis not present

## 2018-09-26 DIAGNOSIS — M7741 Metatarsalgia, right foot: Secondary | ICD-10-CM | POA: Diagnosis not present

## 2018-09-26 DIAGNOSIS — D2371 Other benign neoplasm of skin of right lower limb, including hip: Secondary | ICD-10-CM | POA: Diagnosis not present

## 2018-09-29 ENCOUNTER — Telehealth: Payer: Self-pay | Admitting: Internal Medicine

## 2018-09-29 DIAGNOSIS — R9389 Abnormal findings on diagnostic imaging of other specified body structures: Secondary | ICD-10-CM | POA: Diagnosis not present

## 2018-09-29 DIAGNOSIS — J471 Bronchiectasis with (acute) exacerbation: Secondary | ICD-10-CM | POA: Diagnosis not present

## 2018-09-29 NOTE — Telephone Encounter (Signed)
Patient declined to schedule.  Being seen at [facility] .  Deleting recall.

## 2018-10-04 DIAGNOSIS — J479 Bronchiectasis, uncomplicated: Secondary | ICD-10-CM | POA: Diagnosis not present

## 2018-10-04 DIAGNOSIS — A319 Mycobacterial infection, unspecified: Secondary | ICD-10-CM | POA: Diagnosis not present

## 2018-11-23 DIAGNOSIS — H40153 Residual stage of open-angle glaucoma, bilateral: Secondary | ICD-10-CM | POA: Diagnosis not present

## 2018-11-28 DIAGNOSIS — M25571 Pain in right ankle and joints of right foot: Secondary | ICD-10-CM | POA: Diagnosis not present

## 2018-11-28 DIAGNOSIS — D2371 Other benign neoplasm of skin of right lower limb, including hip: Secondary | ICD-10-CM | POA: Diagnosis not present

## 2018-11-28 DIAGNOSIS — M2021 Hallux rigidus, right foot: Secondary | ICD-10-CM | POA: Diagnosis not present

## 2018-11-30 DIAGNOSIS — Z85828 Personal history of other malignant neoplasm of skin: Secondary | ICD-10-CM | POA: Diagnosis not present

## 2018-11-30 DIAGNOSIS — Z08 Encounter for follow-up examination after completed treatment for malignant neoplasm: Secondary | ICD-10-CM | POA: Diagnosis not present

## 2018-11-30 DIAGNOSIS — L821 Other seborrheic keratosis: Secondary | ICD-10-CM | POA: Diagnosis not present

## 2018-12-07 DIAGNOSIS — R5383 Other fatigue: Secondary | ICD-10-CM | POA: Diagnosis not present

## 2018-12-08 DIAGNOSIS — H93299 Other abnormal auditory perceptions, unspecified ear: Secondary | ICD-10-CM | POA: Diagnosis not present

## 2018-12-08 DIAGNOSIS — H6123 Impacted cerumen, bilateral: Secondary | ICD-10-CM | POA: Diagnosis not present

## 2018-12-26 DIAGNOSIS — D2372 Other benign neoplasm of skin of left lower limb, including hip: Secondary | ICD-10-CM | POA: Diagnosis not present

## 2018-12-26 DIAGNOSIS — D2371 Other benign neoplasm of skin of right lower limb, including hip: Secondary | ICD-10-CM | POA: Diagnosis not present

## 2019-01-17 DIAGNOSIS — J479 Bronchiectasis, uncomplicated: Secondary | ICD-10-CM | POA: Diagnosis not present

## 2019-02-06 DIAGNOSIS — D2371 Other benign neoplasm of skin of right lower limb, including hip: Secondary | ICD-10-CM | POA: Diagnosis not present

## 2019-02-19 DIAGNOSIS — I10 Essential (primary) hypertension: Secondary | ICD-10-CM | POA: Diagnosis not present

## 2019-02-19 DIAGNOSIS — Z8249 Family history of ischemic heart disease and other diseases of the circulatory system: Secondary | ICD-10-CM | POA: Diagnosis not present

## 2019-02-19 DIAGNOSIS — H401412 Capsular glaucoma with pseudoexfoliation of lens, right eye, moderate stage: Secondary | ICD-10-CM | POA: Diagnosis not present

## 2019-02-19 DIAGNOSIS — H2511 Age-related nuclear cataract, right eye: Secondary | ICD-10-CM | POA: Diagnosis not present

## 2019-02-19 DIAGNOSIS — Z87891 Personal history of nicotine dependence: Secondary | ICD-10-CM | POA: Diagnosis not present

## 2019-02-27 DIAGNOSIS — H401433 Capsular glaucoma with pseudoexfoliation of lens, bilateral, severe stage: Secondary | ICD-10-CM | POA: Diagnosis not present

## 2019-03-01 DIAGNOSIS — I340 Nonrheumatic mitral (valve) insufficiency: Secondary | ICD-10-CM | POA: Diagnosis not present

## 2019-03-01 DIAGNOSIS — Z87898 Personal history of other specified conditions: Secondary | ICD-10-CM | POA: Diagnosis not present

## 2019-03-01 DIAGNOSIS — I10 Essential (primary) hypertension: Secondary | ICD-10-CM | POA: Diagnosis not present

## 2019-03-07 DIAGNOSIS — J479 Bronchiectasis, uncomplicated: Secondary | ICD-10-CM | POA: Diagnosis not present

## 2019-03-07 DIAGNOSIS — J9809 Other diseases of bronchus, not elsewhere classified: Secondary | ICD-10-CM | POA: Diagnosis not present

## 2019-03-07 DIAGNOSIS — R0602 Shortness of breath: Secondary | ICD-10-CM | POA: Diagnosis not present

## 2019-03-08 DIAGNOSIS — I340 Nonrheumatic mitral (valve) insufficiency: Secondary | ICD-10-CM | POA: Diagnosis not present

## 2019-03-08 DIAGNOSIS — I10 Essential (primary) hypertension: Secondary | ICD-10-CM | POA: Diagnosis not present

## 2019-03-12 DIAGNOSIS — H40051 Ocular hypertension, right eye: Secondary | ICD-10-CM | POA: Diagnosis not present

## 2019-03-12 DIAGNOSIS — Z9889 Other specified postprocedural states: Secondary | ICD-10-CM | POA: Diagnosis not present

## 2019-03-12 DIAGNOSIS — Z961 Presence of intraocular lens: Secondary | ICD-10-CM | POA: Diagnosis not present

## 2019-03-12 DIAGNOSIS — H401113 Primary open-angle glaucoma, right eye, severe stage: Secondary | ICD-10-CM | POA: Diagnosis not present

## 2019-03-12 DIAGNOSIS — Z87891 Personal history of nicotine dependence: Secondary | ICD-10-CM | POA: Diagnosis not present

## 2019-03-12 DIAGNOSIS — H401413 Capsular glaucoma with pseudoexfoliation of lens, right eye, severe stage: Secondary | ICD-10-CM | POA: Diagnosis not present

## 2019-03-12 DIAGNOSIS — H4010X3 Unspecified open-angle glaucoma, severe stage: Secondary | ICD-10-CM | POA: Diagnosis not present

## 2019-03-12 DIAGNOSIS — I10 Essential (primary) hypertension: Secondary | ICD-10-CM | POA: Diagnosis not present

## 2019-03-22 DIAGNOSIS — J479 Bronchiectasis, uncomplicated: Secondary | ICD-10-CM | POA: Diagnosis not present

## 2019-04-19 DIAGNOSIS — I340 Nonrheumatic mitral (valve) insufficiency: Secondary | ICD-10-CM | POA: Diagnosis not present

## 2019-04-24 DIAGNOSIS — I10 Essential (primary) hypertension: Secondary | ICD-10-CM | POA: Diagnosis not present

## 2019-04-24 DIAGNOSIS — J479 Bronchiectasis, uncomplicated: Secondary | ICD-10-CM | POA: Diagnosis not present

## 2019-04-24 DIAGNOSIS — I340 Nonrheumatic mitral (valve) insufficiency: Secondary | ICD-10-CM | POA: Diagnosis not present

## 2019-05-31 DIAGNOSIS — R399 Unspecified symptoms and signs involving the genitourinary system: Secondary | ICD-10-CM | POA: Diagnosis not present

## 2019-05-31 DIAGNOSIS — N390 Urinary tract infection, site not specified: Secondary | ICD-10-CM | POA: Diagnosis not present

## 2019-06-08 DIAGNOSIS — F3175 Bipolar disorder, in partial remission, most recent episode depressed: Secondary | ICD-10-CM | POA: Diagnosis not present

## 2019-06-08 DIAGNOSIS — Z5181 Encounter for therapeutic drug level monitoring: Secondary | ICD-10-CM | POA: Diagnosis not present

## 2019-06-19 IMAGING — CT CT CHEST W/O CM
1 series · 15 of 34 positions shown, 19 images · non-contrast
Comparison: 03/03/2017 chest radiograph.   06/07/2016 chest CT.

CLINICAL DATA: Bronchiectasis. Acute exacerbation. Chronic cough.
Nonsmoker.

EXAM:
CT CHEST WITHOUT CONTRAST
TECHNIQUE: Multidetector CT imaging of the chest was performed following the
standard protocol without IV contrast.

[Series 2: thorax · axial · 0.59mm/px · z∈[-589,-341]mm · 15 of 146 slices shown, 19 images]
[im 11/146  mediastinal]
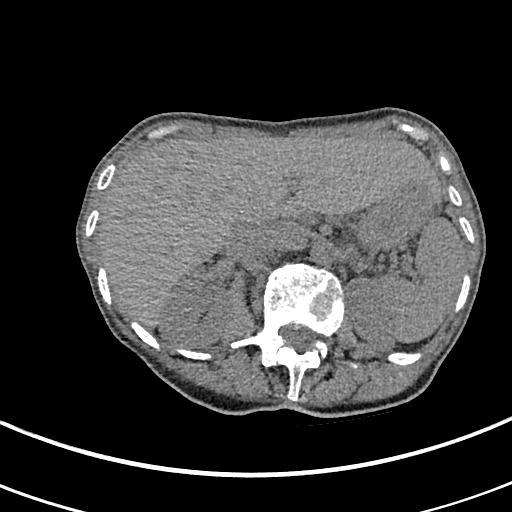
[im 11/146  lung]
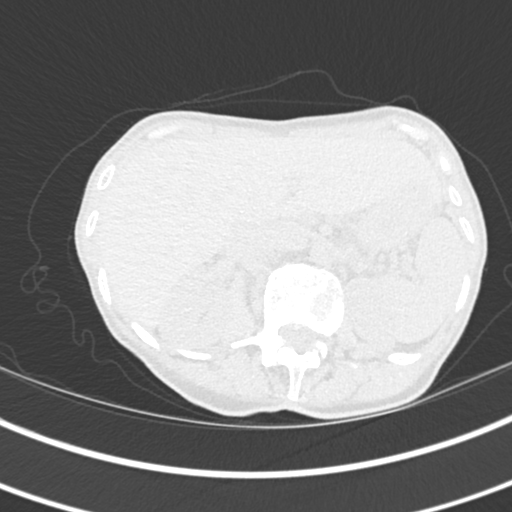
[im 22/146  lung]
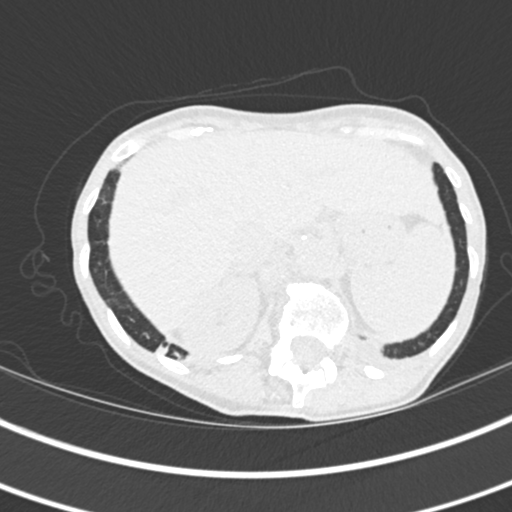
[im 30/146  lung]
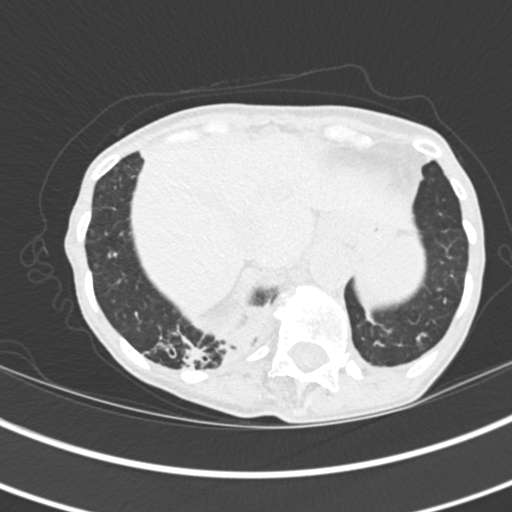
[im 38/146  lung]
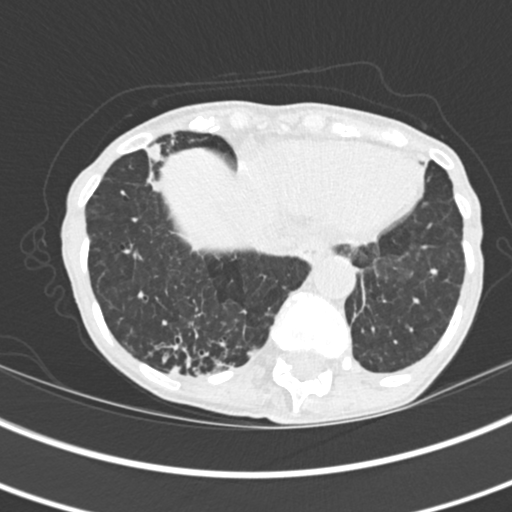
[im 49/146  mediastinal]
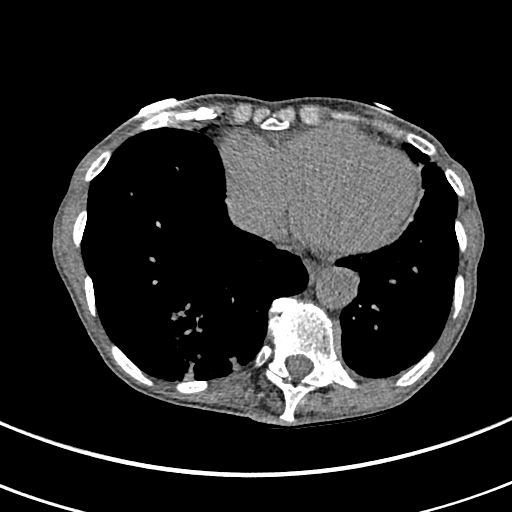
[im 49/146  lung]
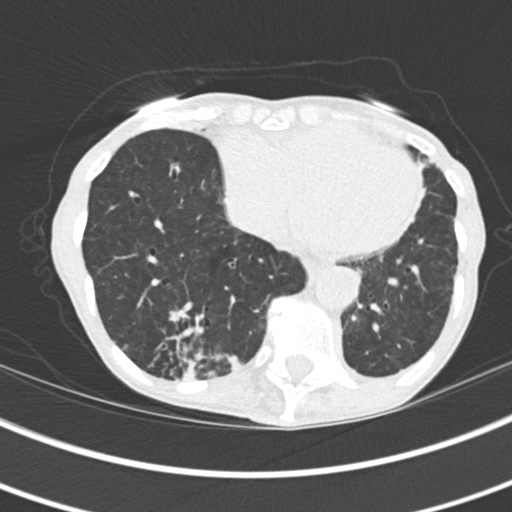
[im 59/146  lung]
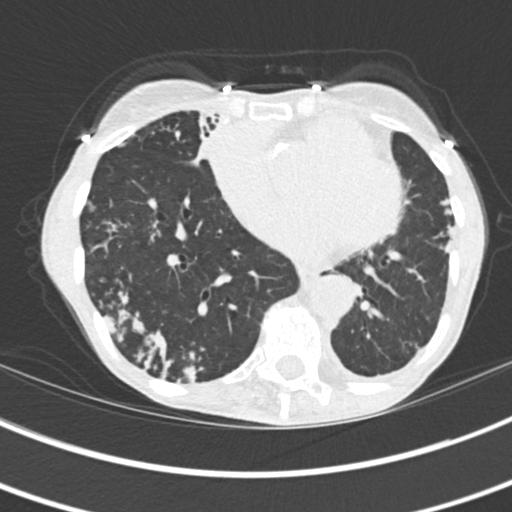
[im 65/146  lung]
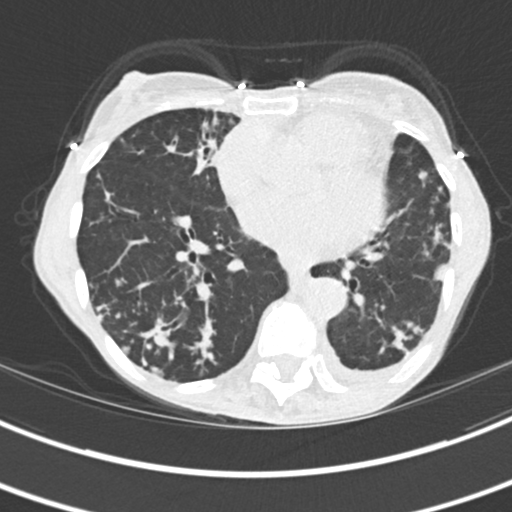
[im 76/146  lung]
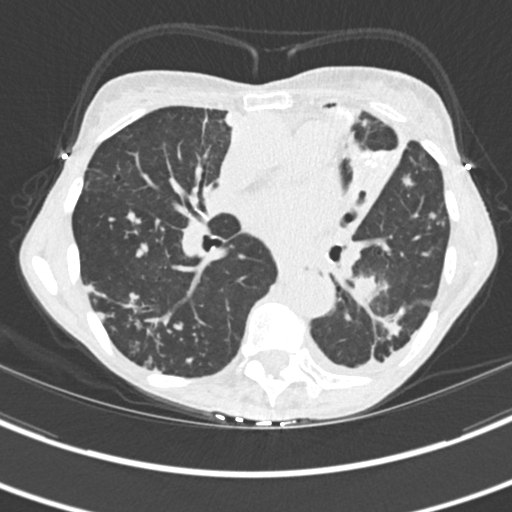
[im 81/146  mediastinal]
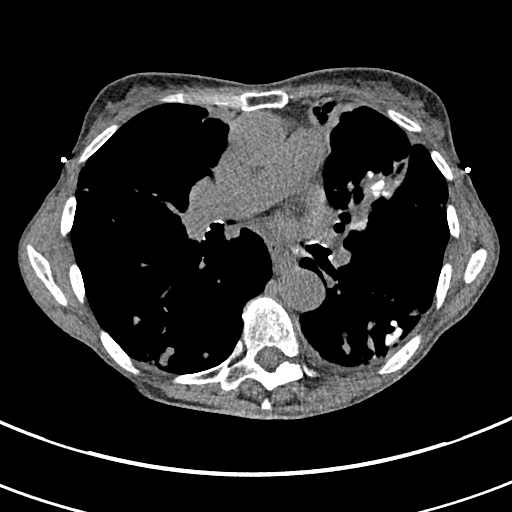
[im 81/146  lung]
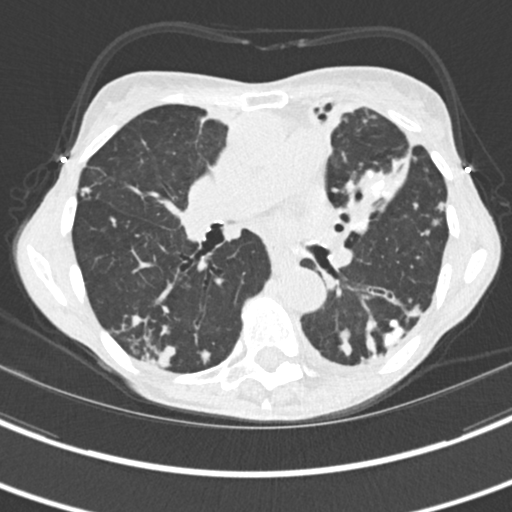
[im 88/146  lung]
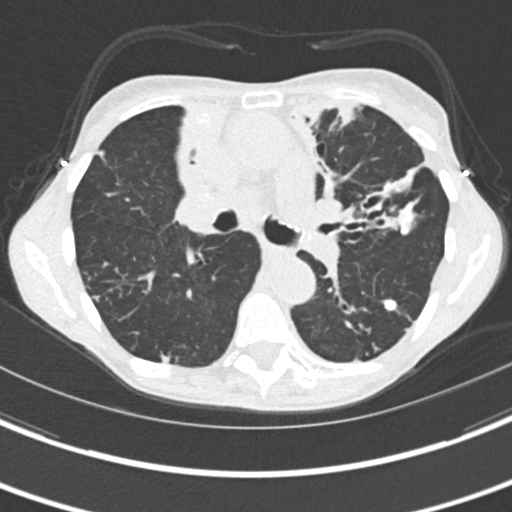
[im 97/146  lung]
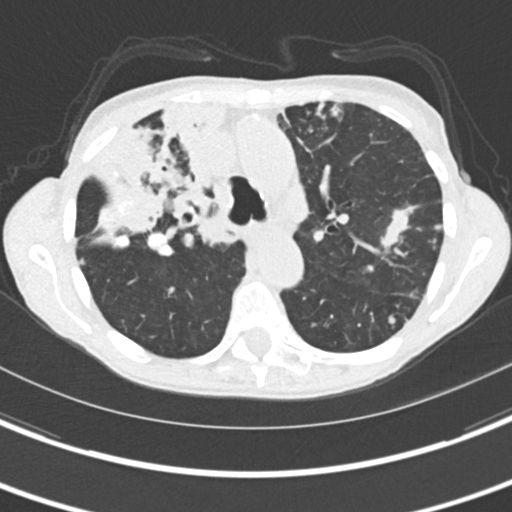
[im 108/146  lung]
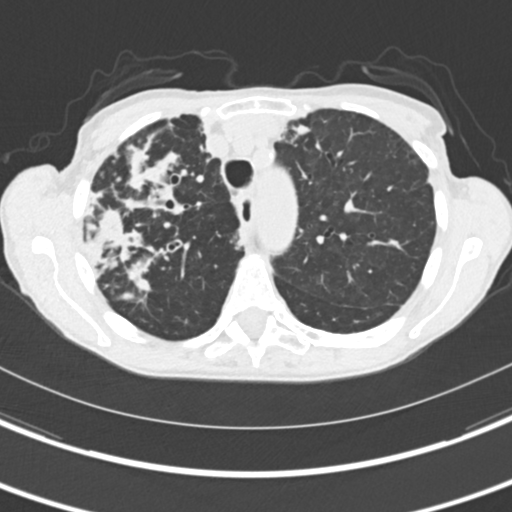
[im 117/146  mediastinal]
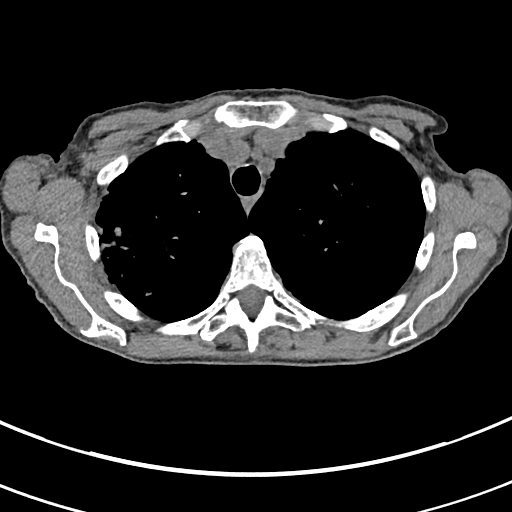
[im 117/146  lung]
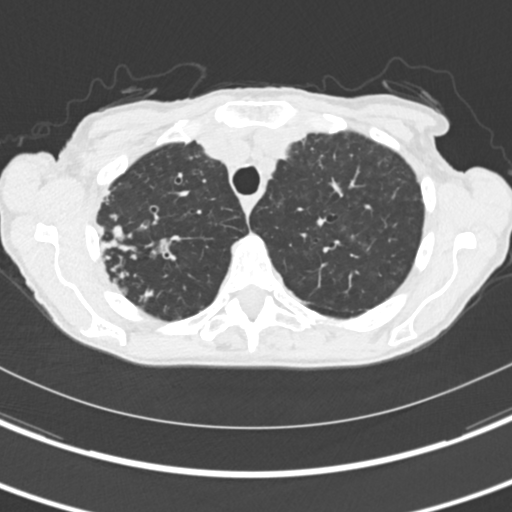
[im 124/146  lung]
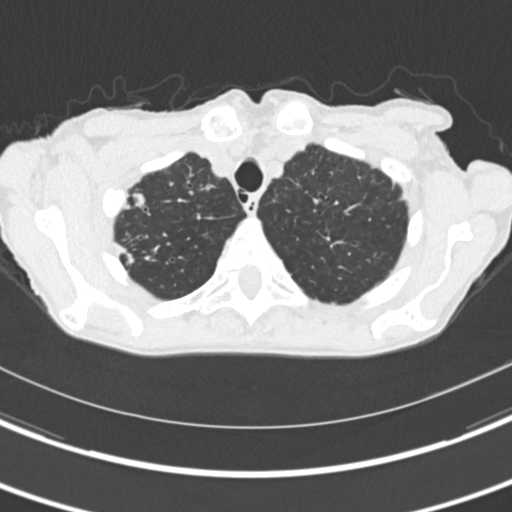
[im 135/146  lung]
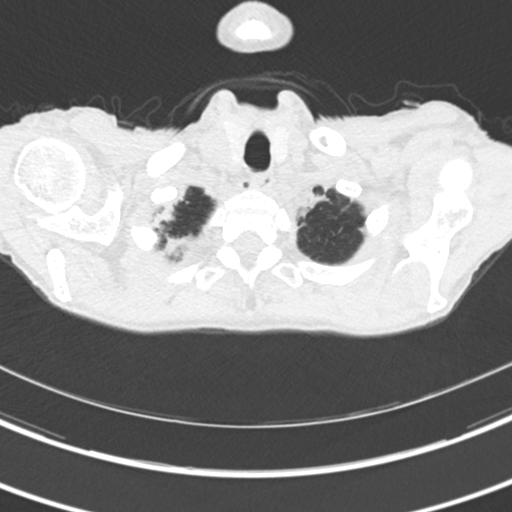

[15 of 34 positions shown; findings below may reference images not displayed]

FINDINGS: Cardiovascular: Normal heart size. No significant pericardial
fluid/thickening. Atherosclerotic nonaneurysmal thoracic aorta.
Normal caliber pulmonary arteries.

Mediastinum/Nodes: No discrete thyroid nodules. Unremarkable
esophagus. No pathologically enlarged axillary, mediastinal or gross
hilar lymph nodes, noting limited sensitivity for the detection of
hilar adenopathy on this noncontrast study.

Lungs/Pleura: No pneumothorax. No right pleural effusion. Trace
dependent left pleural effusion. There is severe patchy varicoid
bronchiectasis extensively distributed throughout both lungs
involving all lung lobes, most prominent in the right upper lobe.
There is associated extensive patchy tree-in-bud opacity throughout
the areas of bronchiectasis. There is irregular confluent nodular
peribronchovascular consolidation throughout the areas of
bronchiectasis, most prominent in the basilar upper lobes, with
associated volume loss and distortion particularly in the upper
lobes. These findings have significantly progressed since 06/07/2016
chest CT throughout both lungs particularly in the basilar right
upper lobe and dependent right lower lobe. Calcified granulomas are
scattered throughout both lungs, not appreciably changed.

Upper abdomen:  No acute abnormality.

Musculoskeletal: No aggressive appearing focal osseous lesions.
Moderate thoracic spondylosis.
IMPRESSION: 1. Spectrum of findings compatible with severe chronic infectious
bronchiolitis most likely due to atypical mycobacterial infection
(APOLINARY), with extensive severe varicoid bronchiectasis, tree-in-bud
opacities and confluent nodular peribronchovascular consolidation.
Significant interval disease progression, particularly in the right
lung, as detailed.
2. Trace dependent left pleural effusion.

Aortic Atherosclerosis (J8MMP-FRA.A).

## 2019-06-28 DIAGNOSIS — R7303 Prediabetes: Secondary | ICD-10-CM | POA: Diagnosis not present

## 2019-06-28 DIAGNOSIS — J479 Bronchiectasis, uncomplicated: Secondary | ICD-10-CM | POA: Diagnosis not present

## 2019-06-28 DIAGNOSIS — F3132 Bipolar disorder, current episode depressed, moderate: Secondary | ICD-10-CM | POA: Diagnosis not present

## 2019-06-28 DIAGNOSIS — I10 Essential (primary) hypertension: Secondary | ICD-10-CM | POA: Diagnosis not present

## 2019-06-28 DIAGNOSIS — Z79899 Other long term (current) drug therapy: Secondary | ICD-10-CM | POA: Diagnosis not present

## 2019-07-06 DIAGNOSIS — H401433 Capsular glaucoma with pseudoexfoliation of lens, bilateral, severe stage: Secondary | ICD-10-CM | POA: Diagnosis not present

## 2019-07-06 DIAGNOSIS — Z01818 Encounter for other preprocedural examination: Secondary | ICD-10-CM | POA: Diagnosis not present

## 2019-07-06 DIAGNOSIS — Z1159 Encounter for screening for other viral diseases: Secondary | ICD-10-CM | POA: Diagnosis not present

## 2019-07-09 DIAGNOSIS — Z87891 Personal history of nicotine dependence: Secondary | ICD-10-CM | POA: Diagnosis not present

## 2019-07-09 DIAGNOSIS — Z803 Family history of malignant neoplasm of breast: Secondary | ICD-10-CM | POA: Diagnosis not present

## 2019-07-09 DIAGNOSIS — Z79899 Other long term (current) drug therapy: Secondary | ICD-10-CM | POA: Diagnosis not present

## 2019-07-09 DIAGNOSIS — Z8249 Family history of ischemic heart disease and other diseases of the circulatory system: Secondary | ICD-10-CM | POA: Diagnosis not present

## 2019-07-09 DIAGNOSIS — I10 Essential (primary) hypertension: Secondary | ICD-10-CM | POA: Diagnosis not present

## 2019-07-09 DIAGNOSIS — H401123 Primary open-angle glaucoma, left eye, severe stage: Secondary | ICD-10-CM | POA: Diagnosis not present

## 2019-07-25 ENCOUNTER — Encounter: Payer: Self-pay | Admitting: Emergency Medicine

## 2019-07-25 ENCOUNTER — Other Ambulatory Visit: Payer: Self-pay

## 2019-07-25 ENCOUNTER — Emergency Department: Payer: Medicare Other

## 2019-07-25 ENCOUNTER — Emergency Department
Admission: EM | Admit: 2019-07-25 | Discharge: 2019-07-25 | Disposition: A | Discharge status: Home / Self Care | Payer: Medicare Other | Attending: Emergency Medicine | Admitting: Emergency Medicine

## 2019-07-25 DIAGNOSIS — K5900 Constipation, unspecified: Secondary | ICD-10-CM | POA: Insufficient documentation

## 2019-07-25 DIAGNOSIS — Z79899 Other long term (current) drug therapy: Secondary | ICD-10-CM | POA: Diagnosis not present

## 2019-07-25 NOTE — Discharge Instructions (Addendum)
The plan is to increase the natural fiber in the diet. Look over the list and add fiber-rich foods to the diet daily. Take 1/2 bottle of magnesium citrate with 17 grams of Miralax in the morning. You may repeat the dose in 5-7 days, as needed. Start daily Miralax to promote regular, soft stools. Follow-up with Dr. Doy Hutching for ongoing symptoms.

## 2019-07-25 NOTE — ED Triage Notes (Signed)
Pt in via POV, reports constipation x 3 days.  Pt states, "That is normal after I take a laxative and get cleaned out."  Pt also expresses concern for skin tear to LLE; skin tear noted to left lateral shin, minimal drainage, no signs of infection at this time.  Vitals WDL, NAD noted.

## 2019-07-25 NOTE — ED Provider Notes (Signed)
[facility] Emergency Department Provider Note ____________________________________________  Time seen: 1604  I have reviewed the triage vital signs and the nursing notes.  HISTORY  Chief Complaint  Constipation and Skin Tear  HPI Sierra Benson is a 85 y.o. female presents to the ED accompanied by her adult daughter, for evaluation of about 6 months of intermittent constipation.  Patient denies being evaluated by her provider, and is currently taking Dulcolax every 3 to 4 days to help with bowel movements.  The patient reports that she does not take the laxative, she will not have a bowel movement.  She reports last week she had an episode where she had gone about a week without a dose of a laxative, and thought she was impacted.  She finally took a Dulcolax, and had a meaningful bowel movement.  She presents today with complaints of no bowel movement for the last 3 days.  She denies any fever, chills, or sweats. She continues to eat   Past Medical History:  Diagnosis Date  . Bacterial pneumonia 11/2015  . Cystocele   . Glaucoma   . Insomnia   . Menopausal state   . Mitral valve disorder   . Nocturia   . Prediabetes 06/19/2015  . Rectocele   . Vaginal atrophy   . Varicose veins   . Vitamin D deficiency     Patient Active Problem List   Diagnosis Date Noted  . Crushing injury of finger 06/10/2017  . Mucoid impaction of bronchi 06/24/2016  . Pseudophakia of left eye 04/27/2016  . Bipolar 1 disorder, depressed, moderate ([place])   . Right lower quadrant pain 10/29/2015  . Hemorrhage of anus and rectum 10/29/2015  . History of palpitations 06/19/2015  . BP (high blood pressure) 06/19/2015  . Underweight due to inadequate caloric intake 06/19/2015  . Prediabetes 06/19/2015  . Primary open angle glaucoma of left eye, severe stage 01/09/2015  . 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 Cataract 06/27/2012    Past Surgical History:  Procedure Laterality  Date  . BREAST BIOPSY Right    benign nodule  . EYE SURGERY Left    cataract removed  . FOOT SURGERY    . VAGINAL HYSTERECTOMY     menorrhagia    Prior to Admission medications   Medication Sig Start Date End Date Taking? Authorizing Provider  bimatoprost (LUMIGAN) 0.01 % SOLN Place 1 drop into both eyes at bedtime.     [provider]  cephALEXin (KEFLEX) 250 MG capsule Take 1 capsule (250 mg total) by mouth 4 (four) times daily. 06/23/18   Hayden Rasmussen, MD  lithium 300 MG tablet Take 1 tablet (300 mg total) by mouth at bedtime. Patient taking differently: Take 300 mg by mouth daily.  12/10/15 06/23/18  Earleen Newport, MD  magnesium oxide (MAG-OX) 400 MG tablet Take 400 mg by mouth daily.    [provider]  Multiple Vitamins-Minerals (MULTIVITAMIN ADULT PO) Take 1 tablet by mouth daily.     [provider]  Respiratory Therapy Supplies (FLUTTER) DEVI Use 10-15 times daily 10/20/17   Wilhelmina Mcardle, MD  timolol (TIMOPTIC) 0.25 % ophthalmic solution Place 1 drop into both eyes 2 (two) times daily.     [provider]    Allergies Prednisone, Propoxyphene, Celecoxib, and Ibuprofen  Family History  Problem Relation Age of Onset  . Diabetes Brother   . Lung cancer Brother   . Breast cancer Sister   . Ovarian cancer Neg Hx   .  Colon cancer Neg Hx   . Heart disease Neg Hx     Social History Social History   Tobacco Use  . Smoking status: Never Smoker  . Smokeless tobacco: Never Used  Substance Use Topics  . Alcohol use: No  . Drug use: No    Review of Systems  Constitutional: Negative for fever. Eyes: Negative for visual changes. ENT: Negative for sore throat. Cardiovascular: Negative for chest pain. Respiratory: Negative for shortness of breath. Gastrointestinal: Negative for abdominal pain, vomiting and diarrhea. Genitourinary: Negative for dysuria. Musculoskeletal: Negative for back pain. Skin: Negative for  rash. Neurological: Negative for headaches, focal weakness or numbness. ____________________________________________  PHYSICAL EXAM:  VITAL SIGNS: ED Triage Vitals  Enc Vitals Group     BP 07/25/19 1538 (!) 145/92     Pulse Rate 07/25/19 1538 72     Resp 07/25/19 1538 14     Temp 07/25/19 1538 98.1 F (36.7 C)     Temp Source 07/25/19 1538 Oral     SpO2 07/25/19 1538 97 %     Weight 07/25/19 1533 78 lb (35.4 kg)     Height 07/25/19 1533 5' (1.524 m)     Head Circumference --      Peak Flow --      Pain Score 07/25/19 1533 0     Pain Loc --      Pain Edu? --      Excl. in [place]? --     Constitutional: Alert and oriented. Well appearing and in no distress. Head: Normocephalic and atraumatic. Eyes: Conjunctivae are normal. Normal extraocular movements Cardiovascular: Normal rate, regular rhythm. Normal distal pulses. Respiratory: Normal respiratory effort.  Gastrointestinal: Soft and nontender. No distention. Musculoskeletal: Nontender with normal range of motion in all extremities.  Neurologic:  Normal gait without ataxia. Normal speech and language. No gross focal neurologic deficits are appreciated. Skin:  Skin is warm, dry and intact. No rash noted. Healing skin tear to the lower left leg.  ____________________________________________   RADIOLOGY  ABD 1V  Moderate stool with normal gas pattern. No obstruction.   I, Melvenia Needles, personally viewed and evaluated these images (plain radiographs) as part of my medical decision making, as well as reviewing the written report by the radiologist. ____________________________________________  PROCEDURES  Procedures ____________________________________________  INITIAL IMPRESSION / ASSESSMENT AND PLAN / ED COURSE  Sierra Benson was evaluated in Emergency Department on 07/25/2019 for the symptoms described in the history of present illness. She was evaluated in the context of the global COVID-19 pandemic, which  necessitated consideration that the patient might be at risk for infection with the SARS-CoV-2 virus that causes COVID-19. Institutional protocols and algorithms that pertain to the evaluation of patients at risk for COVID-19 are in a state of rapid change based on information released by regulatory bodies including the CDC and federal and state organizations. These policies and algorithms were followed during the patient's care in the ED.  Geriatric patient with ED evaluation of intermittent constipation for the last 6+ months. She has been using laxatives regularly. Her exam is benign without signs of acute abdominal process, bowel obstruction, or impaction. She is advised to eliminate laxatives and use daily Miralax. She will use mag citrate this week to allow for relief of constipation. She is advised to increase fiber intake and follow-up with her provider or return for ongoing symptoms. She and her adult daughter verbalize understanding of discharge instructions.  ____________________________________________  FINAL CLINICAL IMPRESSION(S) / ED DIAGNOSES  Final diagnoses:  Constipation, unspecified constipation type      Melvenia Needles, PA-C 07/25/19 1905    Vanessa Bonham, MD 07/25/19 1924

## 2019-08-09 DIAGNOSIS — H40153 Residual stage of open-angle glaucoma, bilateral: Secondary | ICD-10-CM | POA: Diagnosis not present

## 2019-08-21 DIAGNOSIS — R634 Abnormal weight loss: Secondary | ICD-10-CM | POA: Diagnosis not present

## 2019-08-21 DIAGNOSIS — F3132 Bipolar disorder, current episode depressed, moderate: Secondary | ICD-10-CM | POA: Diagnosis not present

## 2019-09-06 DIAGNOSIS — R634 Abnormal weight loss: Secondary | ICD-10-CM | POA: Diagnosis not present

## 2019-09-27 DIAGNOSIS — F319 Bipolar disorder, unspecified: Secondary | ICD-10-CM | POA: Diagnosis not present

## 2019-09-27 DIAGNOSIS — Z0000 Encounter for general adult medical examination without abnormal findings: Secondary | ICD-10-CM | POA: Diagnosis not present

## 2019-09-27 DIAGNOSIS — I10 Essential (primary) hypertension: Secondary | ICD-10-CM | POA: Diagnosis not present

## 2019-09-27 DIAGNOSIS — I38 Endocarditis, valve unspecified: Secondary | ICD-10-CM | POA: Diagnosis not present

## 2019-09-27 DIAGNOSIS — J479 Bronchiectasis, uncomplicated: Secondary | ICD-10-CM | POA: Diagnosis not present

## 2019-09-27 DIAGNOSIS — Z87891 Personal history of nicotine dependence: Secondary | ICD-10-CM | POA: Diagnosis not present

## 2019-09-27 DIAGNOSIS — R7303 Prediabetes: Secondary | ICD-10-CM | POA: Diagnosis not present

## 2019-10-02 DIAGNOSIS — L309 Dermatitis, unspecified: Secondary | ICD-10-CM | POA: Diagnosis not present

## 2019-10-10 DIAGNOSIS — R7303 Prediabetes: Secondary | ICD-10-CM | POA: Diagnosis not present

## 2019-10-10 DIAGNOSIS — Z79899 Other long term (current) drug therapy: Secondary | ICD-10-CM | POA: Diagnosis not present

## 2019-10-10 DIAGNOSIS — E7800 Pure hypercholesterolemia, unspecified: Secondary | ICD-10-CM | POA: Diagnosis not present

## 2019-10-10 DIAGNOSIS — F3132 Bipolar disorder, current episode depressed, moderate: Secondary | ICD-10-CM | POA: Diagnosis not present

## 2019-10-10 DIAGNOSIS — I10 Essential (primary) hypertension: Secondary | ICD-10-CM | POA: Diagnosis not present

## 2019-10-16 DIAGNOSIS — J479 Bronchiectasis, uncomplicated: Secondary | ICD-10-CM | POA: Diagnosis not present

## 2019-10-16 DIAGNOSIS — R0600 Dyspnea, unspecified: Secondary | ICD-10-CM | POA: Diagnosis not present

## 2019-10-17 DIAGNOSIS — J479 Bronchiectasis, uncomplicated: Secondary | ICD-10-CM | POA: Diagnosis not present

## 2019-10-17 DIAGNOSIS — R0600 Dyspnea, unspecified: Secondary | ICD-10-CM | POA: Diagnosis not present

## 2019-10-18 DIAGNOSIS — Z5181 Encounter for therapeutic drug level monitoring: Secondary | ICD-10-CM | POA: Diagnosis not present

## 2019-10-23 DIAGNOSIS — F3132 Bipolar disorder, current episode depressed, moderate: Secondary | ICD-10-CM | POA: Diagnosis not present

## 2019-11-07 DIAGNOSIS — M2011 Hallux valgus (acquired), right foot: Secondary | ICD-10-CM | POA: Diagnosis not present

## 2019-11-07 DIAGNOSIS — D2372 Other benign neoplasm of skin of left lower limb, including hip: Secondary | ICD-10-CM | POA: Diagnosis not present

## 2019-11-07 DIAGNOSIS — M25571 Pain in right ankle and joints of right foot: Secondary | ICD-10-CM | POA: Diagnosis not present

## 2019-11-20 DIAGNOSIS — R002 Palpitations: Secondary | ICD-10-CM | POA: Diagnosis not present

## 2019-11-20 DIAGNOSIS — Z87898 Personal history of other specified conditions: Secondary | ICD-10-CM | POA: Diagnosis not present

## 2019-11-20 DIAGNOSIS — I10 Essential (primary) hypertension: Secondary | ICD-10-CM | POA: Diagnosis not present

## 2019-11-20 DIAGNOSIS — I340 Nonrheumatic mitral (valve) insufficiency: Secondary | ICD-10-CM | POA: Diagnosis not present

## 2019-11-28 DIAGNOSIS — H6123 Impacted cerumen, bilateral: Secondary | ICD-10-CM | POA: Diagnosis not present

## 2019-11-28 DIAGNOSIS — J310 Chronic rhinitis: Secondary | ICD-10-CM | POA: Diagnosis not present

## 2019-11-29 DIAGNOSIS — D485 Neoplasm of uncertain behavior of skin: Secondary | ICD-10-CM | POA: Diagnosis not present

## 2019-11-29 DIAGNOSIS — L853 Xerosis cutis: Secondary | ICD-10-CM | POA: Diagnosis not present

## 2019-12-04 DIAGNOSIS — H401433 Capsular glaucoma with pseudoexfoliation of lens, bilateral, severe stage: Secondary | ICD-10-CM | POA: Diagnosis not present

## 2021-01-12 ENCOUNTER — Other Ambulatory Visit: Payer: Self-pay

## 2021-01-12 ENCOUNTER — Emergency Department
Admission: EM | Admit: 2021-01-12 | Discharge: 2021-01-12 | Disposition: A | Discharge status: Home / Self Care | Payer: Medicare HMO | Attending: Emergency Medicine | Admitting: Emergency Medicine

## 2021-01-12 ENCOUNTER — Encounter: Payer: Self-pay | Admitting: Intensive Care

## 2021-01-12 ENCOUNTER — Emergency Department: Payer: Medicare HMO

## 2021-01-12 DIAGNOSIS — Z20822 Contact with and (suspected) exposure to covid-19: Secondary | ICD-10-CM | POA: Diagnosis not present

## 2021-01-12 DIAGNOSIS — R531 Weakness: Secondary | ICD-10-CM

## 2021-01-12 DIAGNOSIS — E860 Dehydration: Secondary | ICD-10-CM | POA: Insufficient documentation

## 2021-01-12 DIAGNOSIS — Z87891 Personal history of nicotine dependence: Secondary | ICD-10-CM | POA: Insufficient documentation

## 2021-01-12 DIAGNOSIS — R410 Disorientation, unspecified: Secondary | ICD-10-CM | POA: Diagnosis present

## 2021-01-12 LAB — COMPREHENSIVE METABOLIC PANEL
ALT: 11 U/L (ref 0–44)
AST: 20 U/L (ref 15–41)
Albumin: 3.1 g/dL — ABNORMAL LOW (ref 3.5–5.0)
Alkaline Phosphatase: 131 U/L — ABNORMAL HIGH (ref 38–126)
Anion gap: 9 (ref 5–15)
BUN: 26 mg/dL — ABNORMAL HIGH (ref 8–23)
CO2: 26 mmol/L (ref 22–32)
Calcium: 9.5 mg/dL (ref 8.9–10.3)
Chloride: 99 mmol/L (ref 98–111)
Creatinine, Ser: 1 mg/dL (ref 0.44–1.00)
GFR, Estimated: 54 mL/min — ABNORMAL LOW (ref >60)
Glucose, Bld: 112 mg/dL — ABNORMAL HIGH (ref 70–99)
Potassium: 4.4 mmol/L (ref 3.5–5.1)
Sodium: 134 mmol/L — ABNORMAL LOW (ref 135–145)
Total Bilirubin: 0.9 mg/dL (ref 0.3–1.2)
Total Protein: 8.2 g/dL — ABNORMAL HIGH (ref 6.5–8.1)

## 2021-01-12 LAB — CBC
HCT: 39.5 % (ref 36.0–46.0)
Hemoglobin: 12.7 g/dL (ref 12.0–15.0)
MCH: 32.7 pg (ref 26.0–34.0)
MCHC: 32.2 g/dL (ref 30.0–36.0)
MCV: 101.8 fL — ABNORMAL HIGH (ref 80.0–100.0)
Platelets: 392 10*3/uL (ref 150–400)
RBC: 3.88 MIL/uL (ref 3.87–5.11)
RDW: 14.7 % (ref 11.5–15.5)
WBC: 13.2 10*3/uL — ABNORMAL HIGH (ref 4.0–10.5)
nRBC: 0 % (ref 0.0–0.2)

## 2021-01-12 LAB — URINALYSIS, COMPLETE (UACMP) WITH MICROSCOPIC
Bacteria, UA: NONE SEEN
Bilirubin Urine: NEGATIVE
Glucose, UA: NEGATIVE mg/dL
Hgb urine dipstick: NEGATIVE
Ketones, ur: NEGATIVE mg/dL
Nitrite: NEGATIVE
Protein, ur: NEGATIVE mg/dL
Specific Gravity, Urine: 1.015 (ref 1.005–1.030)
pH: 6 (ref 5.0–8.0)

## 2021-01-12 LAB — POC SARS CORONAVIRUS 2 AG -  ED: SARS Coronavirus 2 Ag: NEGATIVE

## 2021-01-12 MED ORDER — LACTATED RINGERS IV BOLUS
1000.0000 mL | Freq: Once | INTRAVENOUS | Status: AC
  Administered 2021-01-12: 1000 mL via INTRAVENOUS

## 2021-01-12 NOTE — ED Notes (Signed)
Patient to CT at this time

## 2021-01-12 NOTE — ED Provider Notes (Signed)
[facility] Emergency Department Provider Note  ____________________________________________  Time seen: Approximately 7:29 PM  I have reviewed the triage vital signs and the nursing notes.   HISTORY  Chief Complaint Recurrent UTI and decreased oral intake    HPI Sierra Benson is a 87 y.o. female with a history of bipolar disorder  is being brought to the ED due to poor oral intake, low energy, intermittent confusion for the past 3 weeks according to daughter at bedside.  She was recently treated for UTI, and then went back to clinic today where they sent her to the ED for possible refractory UTI and confusion.  Patient also notes that she had a minor head injury about a month ago on her way into outpatient clinic where she hit her forehead and had a small hematoma.  She has reported some headaches intermittently.  No vision changes or focal weakness, no dizziness or falls. Denies chest pain or shortness of breath.  No fever.      Past Medical History:  Diagnosis Date  . Bacterial pneumonia 11/2015  . Cystocele   . Glaucoma   . Insomnia   . Menopausal state   . Mitral valve disorder   . Nocturia   . Prediabetes 06/19/2015  . Rectocele   . Vaginal atrophy   . Varicose veins   . Vitamin D deficiency      Patient Active Problem List   Diagnosis Date Noted  . Crushing injury of finger 06/10/2017  . Mucoid impaction of bronchi 06/24/2016  . Pseudophakia of left eye 04/27/2016  . Bipolar 1 disorder, depressed, moderate ([place])   . Right lower quadrant pain 10/29/2015  . Hemorrhage of anus and rectum 10/29/2015  . History of palpitations 06/19/2015  . BP (high blood pressure) 06/19/2015  . Underweight due to inadequate caloric intake 06/19/2015  . Prediabetes 06/19/2015  . Primary open angle glaucoma of left eye, severe stage 01/09/2015  . 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 Cataract 06/27/2012     Past Surgical History:  Procedure  Laterality Date  . BREAST BIOPSY Right    benign nodule  . EYE SURGERY Left    cataract removed  . FOOT SURGERY    . VAGINAL HYSTERECTOMY     menorrhagia     Prior to Admission medications   Medication Sig Start Date End Date Taking? Authorizing Provider  bimatoprost (LUMIGAN) 0.01 % SOLN Place 1 drop into both eyes at bedtime.     [provider]  cephALEXin (KEFLEX) 250 MG capsule Take 1 capsule (250 mg total) by mouth 4 (four) times daily. 06/23/18   Hayden Rasmussen, MD  lithium 300 MG tablet Take 1 tablet (300 mg total) by mouth at bedtime. Patient taking differently: Take 300 mg by mouth daily.  12/10/15 06/23/18  Earleen Newport, MD  magnesium oxide (MAG-OX) 400 MG tablet Take 400 mg by mouth daily.    [provider]  Multiple Vitamins-Minerals (MULTIVITAMIN ADULT PO) Take 1 tablet by mouth daily.     [provider]  Respiratory Therapy Supplies (FLUTTER) DEVI Use 10-15 times daily 10/20/17   Wilhelmina Mcardle, MD  timolol (TIMOPTIC) 0.25 % ophthalmic solution Place 1 drop into both eyes 2 (two) times daily.     [provider]     Allergies Prednisone, Propoxyphene, Celecoxib, and Ibuprofen   Family History  Problem Relation Age of Onset  . Diabetes Brother   . Lung cancer Brother   . Breast cancer  Sister   . Ovarian cancer Neg Hx   . Colon cancer Neg Hx   . Heart disease Neg Hx     Social History Social History   Tobacco Use  . Smoking status: Former Research scientist (life sciences)  . Smokeless tobacco: Never Used  Vaping Use  . Vaping Use: Never used  Substance Use Topics  . Alcohol use: No  . Drug use: No    Review of Systems  Constitutional:   No fever or chills.  ENT:   No sore throat. No rhinorrhea. Cardiovascular:   No chest pain or syncope. Respiratory:   No dyspnea, positive chronic cough. Gastrointestinal:   Negative for abdominal pain, vomiting and diarrhea.  Musculoskeletal:   Negative for focal pain or swelling All other  systems reviewed and are negative except as documented above in ROS and HPI.  ____________________________________________   PHYSICAL EXAM:  VITAL SIGNS: ED Triage Vitals  Enc Vitals Group     BP 01/12/21 1513 133/84     Pulse Rate 01/12/21 1513 76     Resp 01/12/21 1513 16     Temp 01/12/21 1513 98.2 F (36.8 C)     Temp Source 01/12/21 1513 Oral     SpO2 01/12/21 1513 94 %     Weight 01/12/21 1514 75 lb (34 kg)     Height 01/12/21 1514 4\' 11"  (1.499 m)     Head Circumference --      Peak Flow --      Pain Score 01/12/21 1513 0     Pain Loc --      Pain Edu? --      Excl. in [place]? --     Vital signs reviewed, nursing assessments reviewed.   Constitutional:   Alert and oriented. Non-toxic appearance. Eyes:   Conjunctivae are normal. EOMI. PERRL. ENT      Head:   Normocephalic and atraumatic.      Nose:   Normal      Mouth/Throat:   Normal.      Neck:   No meningismus. Full ROM. Hematological/Lymphatic/Immunilogical:   No cervical lymphadenopathy. Cardiovascular:   RRR. Symmetric bilateral radial and DP pulses.  No murmurs. Cap refill less than 2 seconds. Respiratory:   Normal respiratory effort without tachypnea/retractions. Breath sounds are clear and equal bilaterally. No wheezes/rales/rhonchi. Gastrointestinal:   Soft and nontender. Non distended. There is no CVA tenderness.  No rebound, rigidity, or guarding.  Musculoskeletal:   Normal range of motion in all extremities. No joint effusions.  No lower extremity tenderness.  No edema. Neurologic:   Normal speech and language.  Cranial nerves III through XII intact Normal cerebellar function Motor grossly intact. No acute focal neurologic deficits are appreciated.  Skin:    Skin is warm, dry and intact. No rash noted.  No petechiae, purpura, or bullae.  ____________________________________________    LABS (pertinent positives/negatives) (all labs ordered are listed, but only abnormal results are displayed) Labs  Reviewed  COMPREHENSIVE METABOLIC PANEL - Abnormal; Notable for the following components:      Result Value   Sodium 134 (*)    Glucose, Bld 112 (*)    BUN 26 (*)    Total Protein 8.2 (*)    Albumin 3.1 (*)    Alkaline Phosphatase 131 (*)    GFR, Estimated 54 (*)    All other components within normal limits  CBC - Abnormal; Notable for the following components:   WBC 13.2 (*)    MCV 101.8 (*)  All other components within normal limits  URINALYSIS, COMPLETE (UACMP) WITH MICROSCOPIC - Abnormal; Notable for the following components:   Color, Urine YELLOW (*)    APPearance CLEAR (*)    Leukocytes,Ua TRACE (*)    All other components within normal limits  SARS CORONAVIRUS 2 (TAT 6-24 HRS)  URINE CULTURE  POC SARS CORONAVIRUS 2 AG -  ED   ____________________________________________   EKG    ____________________________________________    RADIOLOGY  DG Chest 2 View  Result Date: 01/12/2021 CLINICAL DATA:  Confusion, weakness EXAM: CHEST - 2 VIEW COMPARISON:  03/03/2017.  CT 11/03/2017. FINDINGS: Bronchiectasis noted bilaterally. Chronic opacities in the right upper lobe and lingula, stable since prior studies. No acute confluent opacity or effusion. Heart is normal size. IMPRESSION: Severe chronic lung changes are stable since prior study. No definite acute process. Electronically Signed   By: Rolm Baptise M.D.   On: 01/12/2021 18:12   CT Head Wo Contrast  Result Date: 01/12/2021 CLINICAL DATA:  Altered mental status EXAM: CT HEAD WITHOUT CONTRAST TECHNIQUE: Contiguous axial images were obtained from the base of the skull through the vertex without intravenous contrast. COMPARISON:  12/15/2016 FINDINGS: Brain: No evidence of acute infarction, hemorrhage, hydrocephalus, extra-axial collection or mass lesion/mass effect. Mild periventricular white matter hypodensity. Vascular: No hyperdense vessel or unexpected calcification. Skull: Normal. Negative for fracture or focal lesion.  Sinuses/Orbits: No acute finding. Other: None. IMPRESSION: No acute intracranial pathology. Small-vessel white matter disease. Electronically Signed   By: Eddie Candle M.D.   On: 01/12/2021 18:09    ____________________________________________   PROCEDURES Procedures  ____________________________________________  DIFFERENTIAL DIAGNOSIS   Intracranial hemorrhage, pneumonia, UTI, dehydration, electrolyte abnormality, Covid/viral illness  CLINICAL IMPRESSION / ASSESSMENT AND PLAN / ED COURSE  Medications ordered in the ED: Medications  lactated ringers bolus 1,000 mL (1,000 mLs Intravenous New Bag/Given 01/12/21 1809)    Pertinent labs & imaging results that were available during my care of the patient were reviewed by me and considered in my medical decision making (see chart for details).  Sierra Benson was evaluated in Emergency Department on 01/12/2021 for the symptoms described in the history of present illness. She was evaluated in the context of the global COVID-19 pandemic, which necessitated consideration that the patient might be at risk for infection with the SARS-CoV-2 virus that causes COVID-19. Institutional protocols and algorithms that pertain to the evaluation of patients at risk for COVID-19 are in a state of rapid change based on information released by regulatory bodies including the CDC and federal and state organizations. These policies and algorithms were followed during the patient's care in the ED.   Patient presents with generalized weakness and some mild confusion ongoing for last 2 to 3 weeks, gradual onset and persistent.  CT head negative for intracranial hemorrhage or apparent infarct.  Chest x-ray image viewed and interpreted by me, unremarkable.  Radiology report agrees. labs unremarkable except for elevated BUN suggestive of a mild degree of dehydration with preserved creatinine.  Urinalysis not consistent with UTI.  At this point I think she stable for  discharge home to follow-up with PCP for continued neurologic work-up.  Suspect she may be developing a degree of dementia along with deconditioning related to poor oral intake.  Serum albumin was decreased at 3.1..      ____________________________________________   FINAL CLINICAL IMPRESSION(S) / ED DIAGNOSES    Final diagnoses:  Generalized weakness  Dehydration     ED Discharge Orders  None      Portions of this note were generated with dragon dictation software. Dictation errors may occur despite best attempts at proofreading.   Carrie Mew, MD 01/12/21 2040

## 2021-01-12 NOTE — ED Triage Notes (Signed)
First nurse note: Patient to ER from [facility] for c/o confusion after UTI diagnosis/treatment one week ago.

## 2021-01-12 NOTE — ED Triage Notes (Signed)
Patient presents with decreased oral intake and possible UTI. Daughter with patient reports she is unable to function at home. Has lost 6lbs in 3-4 weeks due to not eating. Mobility has decreased. Diagnosed December 20,2021 with UTI and daughter reports ever since has gone downhill. Patient A&O x4 in triage with slight confusion.

## 2021-01-12 NOTE — ED Notes (Signed)
Patient given water

## 2021-01-13 LAB — SARS CORONAVIRUS 2 (TAT 6-24 HRS): SARS Coronavirus 2: NEGATIVE

## 2021-01-14 LAB — URINE CULTURE: Culture: NO GROWTH

## 2021-01-19 ENCOUNTER — Other Ambulatory Visit: Payer: Self-pay

## 2021-01-19 ENCOUNTER — Encounter: Payer: Self-pay | Admitting: *Deleted

## 2021-01-19 DIAGNOSIS — Z87891 Personal history of nicotine dependence: Secondary | ICD-10-CM | POA: Diagnosis not present

## 2021-01-19 DIAGNOSIS — R627 Adult failure to thrive: Secondary | ICD-10-CM | POA: Diagnosis not present

## 2021-01-19 DIAGNOSIS — E860 Dehydration: Secondary | ICD-10-CM | POA: Diagnosis not present

## 2021-01-19 DIAGNOSIS — R531 Weakness: Secondary | ICD-10-CM | POA: Diagnosis present

## 2021-01-19 LAB — URINALYSIS, COMPLETE (UACMP) WITH MICROSCOPIC
Bilirubin Urine: NEGATIVE
Glucose, UA: NEGATIVE mg/dL
Hgb urine dipstick: NEGATIVE
Ketones, ur: NEGATIVE mg/dL
Leukocytes,Ua: NEGATIVE
Nitrite: NEGATIVE
Protein, ur: NEGATIVE mg/dL
Specific Gravity, Urine: 1.02 (ref 1.005–1.030)
pH: 6 (ref 5.0–8.0)

## 2021-01-19 LAB — CBC
HCT: 42.4 % (ref 36.0–46.0)
Hemoglobin: 13.4 g/dL (ref 12.0–15.0)
MCH: 32.5 pg (ref 26.0–34.0)
MCHC: 31.6 g/dL (ref 30.0–36.0)
MCV: 102.9 fL — ABNORMAL HIGH (ref 80.0–100.0)
Platelets: 278 10*3/uL (ref 150–400)
RBC: 4.12 MIL/uL (ref 3.87–5.11)
RDW: 14.8 % (ref 11.5–15.5)
WBC: 16.5 10*3/uL — ABNORMAL HIGH (ref 4.0–10.5)
nRBC: 0 % (ref 0.0–0.2)

## 2021-01-19 LAB — COMPREHENSIVE METABOLIC PANEL
ALT: 12 U/L (ref 0–44)
AST: 21 U/L (ref 15–41)
Albumin: 3.1 g/dL — ABNORMAL LOW (ref 3.5–5.0)
Alkaline Phosphatase: 138 U/L — ABNORMAL HIGH (ref 38–126)
Anion gap: 9 (ref 5–15)
BUN: 26 mg/dL — ABNORMAL HIGH (ref 8–23)
CO2: 26 mmol/L (ref 22–32)
Calcium: 9.7 mg/dL (ref 8.9–10.3)
Chloride: 98 mmol/L (ref 98–111)
Creatinine, Ser: 0.86 mg/dL (ref 0.44–1.00)
GFR, Estimated: >60 mL/min (ref >60)
Glucose, Bld: 116 mg/dL — ABNORMAL HIGH (ref 70–99)
Potassium: 4.6 mmol/L (ref 3.5–5.1)
Sodium: 133 mmol/L — ABNORMAL LOW (ref 135–145)
Total Bilirubin: 1.1 mg/dL (ref 0.3–1.2)
Total Protein: 8.4 g/dL — ABNORMAL HIGH (ref 6.5–8.1)

## 2021-01-19 LAB — TROPONIN I (HIGH SENSITIVITY)
Troponin I (High Sensitivity): 9 ng/L (ref <18)
Troponin I (High Sensitivity): 8 ng/L (ref <18)

## 2021-01-19 LAB — CK: Total CK: 21 U/L — ABNORMAL LOW (ref 38–234)

## 2021-01-19 NOTE — ED Triage Notes (Signed)
Pt was seen by PA at Dr. Doy Hutching and sent to ER for further workup.  Per daughter pt has been eating and drinking very little and and had had a decline over the past month. She weighs 75lbs, lost 10lbs over the past month.

## 2021-01-20 ENCOUNTER — Emergency Department
Admission: EM | Admit: 2021-01-20 | Discharge: 2021-01-20 | Disposition: A | Discharge status: Home / Self Care | Payer: Medicare HMO | Attending: Emergency Medicine | Admitting: Emergency Medicine

## 2021-01-20 DIAGNOSIS — E860 Dehydration: Secondary | ICD-10-CM

## 2021-01-20 MED ORDER — DEXTROSE 5 % IN LACTATED RINGERS IV BOLUS
1000.0000 mL | Freq: Once | INTRAVENOUS | Status: AC
  Administered 2021-01-20: 1000 mL via INTRAVENOUS
  Filled 2021-01-20: qty 1000

## 2021-01-20 NOTE — ED Notes (Signed)
Pt asleep at this time, family at bedside 

## 2021-01-20 NOTE — ED Provider Notes (Signed)
[facility] Emergency Department Provider Note  ____________________________________________  Time seen: Approximately 3:58 AM  I have reviewed the triage vital signs and the nursing notes.   HISTORY  Chief Complaint Failure To Thrive    HPI Sierra Benson is a 87 y.o. female with a history of bipolar disorder and hypertension who comes to the ED for generalized weakness.  Daughter at bedside notes that the patient has had poor appetite and oral intake which is a chronic issue.  He did previously been managed by mirtazapine with good results, but about 2 months ago the patient stopped taking it.  At that time, her oral intake became very poor.  She has been back on the mirtazapine for the past month, but not achieving the same benefit as before.  Nutrition and hydration has been difficult to maintain.  Daughter reports loss of 10 pounds in the last month.  Patient denies any pain.  No vomiting or diarrhea.  No fevers or chills.  No shortness of breath, no cough.  No other acute symptoms.      Past Medical History:  Diagnosis Date  . Bacterial pneumonia 11/2015  . Cystocele   . Glaucoma   . Insomnia   . Menopausal state   . Mitral valve disorder   . Nocturia   . Prediabetes 06/19/2015  . Rectocele   . Vaginal atrophy   . Varicose veins   . Vitamin D deficiency      Patient Active Problem List   Diagnosis Date Noted  . Crushing injury of finger 06/10/2017  . Mucoid impaction of bronchi 06/24/2016  . Pseudophakia of left eye 04/27/2016  . Bipolar 1 disorder, depressed, moderate ([place])   . Right lower quadrant pain 10/29/2015  . Hemorrhage of anus and rectum 10/29/2015  . History of palpitations 06/19/2015  . BP (high blood pressure) 06/19/2015  . Underweight due to inadequate caloric intake 06/19/2015  . Prediabetes 06/19/2015  . Primary open angle glaucoma of left eye, severe stage 01/09/2015  . 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  Cataract 06/27/2012     Past Surgical History:  Procedure Laterality Date  . BREAST BIOPSY Right    benign nodule  . EYE SURGERY Left    cataract removed  . FOOT SURGERY    . VAGINAL HYSTERECTOMY     menorrhagia     Prior to Admission medications   Medication Sig Start Date End Date Taking? Authorizing Provider  bimatoprost (LUMIGAN) 0.01 % SOLN Place 1 drop into both eyes at bedtime.     [provider]  cephALEXin (KEFLEX) 250 MG capsule Take 1 capsule (250 mg total) by mouth 4 (four) times daily. 06/23/18   Hayden Rasmussen, MD  lithium 300 MG tablet Take 1 tablet (300 mg total) by mouth at bedtime. Patient taking differently: Take 300 mg by mouth daily.  12/10/15 06/23/18  Earleen Newport, MD  magnesium oxide (MAG-OX) 400 MG tablet Take 400 mg by mouth daily.    [provider]  Multiple Vitamins-Minerals (MULTIVITAMIN ADULT PO) Take 1 tablet by mouth daily.     [provider]  Respiratory Therapy Supplies (FLUTTER) DEVI Use 10-15 times daily 10/20/17   Wilhelmina Mcardle, MD  timolol (TIMOPTIC) 0.25 % ophthalmic solution Place 1 drop into both eyes 2 (two) times daily.     [provider]     Allergies Prednisone, Propoxyphene, Celecoxib, and Ibuprofen   Family History  Problem Relation Age of Onset  .  Diabetes Brother   . Lung cancer Brother   . Breast cancer Sister   . Ovarian cancer Neg Hx   . Colon cancer Neg Hx   . Heart disease Neg Hx     Social History Social History   Tobacco Use  . Smoking status: Former Research scientist (life sciences)  . Smokeless tobacco: Never Used  Vaping Use  . Vaping Use: Never used  Substance Use Topics  . Alcohol use: No  . Drug use: No    Review of Systems  Constitutional:   No fever or chills.  ENT:   No sore throat. No rhinorrhea. Cardiovascular:   No chest pain or syncope. Respiratory:   No dyspnea or cough. Gastrointestinal:   Negative for abdominal pain, vomiting and diarrhea.  Musculoskeletal:    Negative for focal pain or swelling All other systems reviewed and are negative except as documented above in ROS and HPI.  ____________________________________________   PHYSICAL EXAM:  VITAL SIGNS: ED Triage Vitals  Enc Vitals Group     BP 01/19/21 1603 (!) 138/57     Pulse Rate 01/19/21 1603 80     Resp 01/19/21 1603 14     Temp 01/19/21 1603 98.4 F (36.9 C)     Temp Source 01/19/21 1603 Oral     SpO2 01/19/21 1603 95 %     Weight 01/19/21 1557 75 lb (34 kg)     Height --      Head Circumference --      Peak Flow --      Pain Score 01/19/21 1557 0     Pain Loc --      Pain Edu? --      Excl. in [place]? --     Vital signs reviewed, nursing assessments reviewed.   Constitutional:   Alert and oriented. Non-toxic appearance. Eyes:   Conjunctivae are normal. EOMI. PERRL. ENT      Head:   Normocephalic and atraumatic.      Nose: Normal      Mouth/Throat:   Dry mucous membranes      Neck:   No meningismus. Full ROM. Hematological/Lymphatic/Immunilogical:   No cervical lymphadenopathy. Cardiovascular:   RRR. Symmetric bilateral radial and DP pulses.  No murmurs. Cap refill less than 2 seconds. Respiratory:   Normal respiratory effort without tachypnea/retractions. Breath sounds are clear and equal bilaterally. No wheezes/rales/rhonchi. Gastrointestinal:   Soft and nontender. Non distended. There is no CVA tenderness.  No rebound, rigidity, or guarding.  Musculoskeletal:   Normal range of motion in all extremities. No joint effusions.  No lower extremity tenderness.  No edema. Neurologic:   Normal speech and language.  Motor grossly intact. No acute focal neurologic deficits are appreciated.  Skin:    Skin is warm, dry and intact. No rash noted.  No petechiae, purpura, or bullae.  ____________________________________________    LABS (pertinent positives/negatives) (all labs ordered are listed, but only abnormal results are displayed) Labs Reviewed  CBC - Abnormal;  Notable for the following components:      Result Value   WBC 16.5 (*)    MCV 102.9 (*)    All other components within normal limits  URINALYSIS, COMPLETE (UACMP) WITH MICROSCOPIC - Abnormal; Notable for the following components:   Color, Urine YELLOW (*)    APPearance HAZY (*)    Bacteria, UA RARE (*)    All other components within normal limits  COMPREHENSIVE METABOLIC PANEL - Abnormal; Notable for the following components:   Sodium 133 (*)  Glucose, Bld 116 (*)    BUN 26 (*)    Total Protein 8.4 (*)    Albumin 3.1 (*)    Alkaline Phosphatase 138 (*)    All other components within normal limits  CK - Abnormal; Notable for the following components:   Total CK 21 (*)    All other components within normal limits  TROPONIN I (HIGH SENSITIVITY)  TROPONIN I (HIGH SENSITIVITY)   ____________________________________________   EKG  Interpreted by me Normal sinus rhythm rate of 78, normal axis and intervals.  Normal QRS ST segments and T waves.  ____________________________________________    RADIOLOGY  No results found.  ____________________________________________   PROCEDURES Procedures  ____________________________________________  DIFFERENTIAL DIAGNOSIS   Dehydration, electrolyte normality, UTI, dementia  CLINICAL IMPRESSION / ASSESSMENT AND PLAN / ED COURSE  Medications ordered in the ED: Medications  dextrose 5% lactated ringers bolus 1,000 mL (0 mLs Intravenous Stopped 01/20/21 0328)    Pertinent labs & imaging results that were available during my care of the patient were reviewed by me and considered in my medical decision making (see chart for details).  Sierra Benson was evaluated in Emergency Department on 01/20/2021 for the symptoms described in the history of present illness. She was evaluated in the context of the global COVID-19 pandemic, which necessitated consideration that the patient might be at risk for infection with the SARS-CoV-2 virus  that causes COVID-19. Institutional protocols and algorithms that pertain to the evaluation of patients at risk for COVID-19 are in a state of rapid change based on information released by regulatory bodies including the CDC and federal and state organizations. These policies and algorithms were followed during the patient's care in the ED.   Patient brought to the ED with poor oral intake, no acute focal symptoms.  Vital signs are unremarkable, patient is nontoxic, exam is reassuring other than showing some signs of dehydration.  Labs are overall reassuring, normal urinalysis, CK very low.  Metabolic panel unremarkable except for slightly elevated BUN.  CBC unremarkable except for mild leukocytosis which is nonspecific in this setting without any focal symptoms.  No signs of infection.  Patient given IV fluids.  She has an appointment on February 2 with her PCP.      ____________________________________________   FINAL CLINICAL IMPRESSION(S) / ED DIAGNOSES    Final diagnoses:  Dehydration     ED Discharge Orders    None      Portions of this note were generated with dragon dictation software. Dictation errors may occur despite best attempts at proofreading.   Carrie Mew, MD 01/20/21 [phone]

## 2021-02-20 ENCOUNTER — Inpatient Hospital Stay
Admission: EM | Admit: 2021-02-20 | Discharge: 2021-03-02 | DRG: 193 | Disposition: A | Discharge status: Hospice | Payer: Medicare HMO | Attending: Internal Medicine | Admitting: Internal Medicine

## 2021-02-20 ENCOUNTER — Other Ambulatory Visit: Payer: Self-pay

## 2021-02-20 ENCOUNTER — Encounter: Payer: Self-pay | Admitting: Emergency Medicine

## 2021-02-20 ENCOUNTER — Emergency Department: Payer: Medicare HMO

## 2021-02-20 DIAGNOSIS — Z4659 Encounter for fitting and adjustment of other gastrointestinal appliance and device: Secondary | ICD-10-CM

## 2021-02-20 DIAGNOSIS — D539 Nutritional anemia, unspecified: Secondary | ICD-10-CM

## 2021-02-20 DIAGNOSIS — H409 Unspecified glaucoma: Secondary | ICD-10-CM | POA: Diagnosis present

## 2021-02-20 DIAGNOSIS — E860 Dehydration: Secondary | ICD-10-CM | POA: Diagnosis present

## 2021-02-20 DIAGNOSIS — J189 Pneumonia, unspecified organism: Secondary | ICD-10-CM

## 2021-02-20 DIAGNOSIS — T43595A Adverse effect of other antipsychotics and neuroleptics, initial encounter: Secondary | ICD-10-CM | POA: Diagnosis present

## 2021-02-20 DIAGNOSIS — R627 Adult failure to thrive: Secondary | ICD-10-CM

## 2021-02-20 DIAGNOSIS — J479 Bronchiectasis, uncomplicated: Secondary | ICD-10-CM

## 2021-02-20 DIAGNOSIS — Z90710 Acquired absence of both cervix and uterus: Secondary | ICD-10-CM

## 2021-02-20 DIAGNOSIS — R1310 Dysphagia, unspecified: Secondary | ICD-10-CM

## 2021-02-20 DIAGNOSIS — J690 Pneumonitis due to inhalation of food and vomit: Secondary | ICD-10-CM

## 2021-02-20 DIAGNOSIS — Z515 Encounter for palliative care: Secondary | ICD-10-CM

## 2021-02-20 DIAGNOSIS — Z9183 Wandering in diseases classified elsewhere: Secondary | ICD-10-CM

## 2021-02-20 DIAGNOSIS — R7989 Other specified abnormal findings of blood chemistry: Secondary | ICD-10-CM

## 2021-02-20 DIAGNOSIS — J9601 Acute respiratory failure with hypoxia: Secondary | ICD-10-CM

## 2021-02-20 DIAGNOSIS — Z803 Family history of malignant neoplasm of breast: Secondary | ICD-10-CM

## 2021-02-20 DIAGNOSIS — A310 Pulmonary mycobacterial infection: Secondary | ICD-10-CM | POA: Diagnosis present

## 2021-02-20 DIAGNOSIS — Z801 Family history of malignant neoplasm of trachea, bronchus and lung: Secondary | ICD-10-CM

## 2021-02-20 DIAGNOSIS — Z66 Do not resuscitate: Secondary | ICD-10-CM | POA: Diagnosis not present

## 2021-02-20 DIAGNOSIS — F319 Bipolar disorder, unspecified: Secondary | ICD-10-CM

## 2021-02-20 DIAGNOSIS — K5900 Constipation, unspecified: Secondary | ICD-10-CM | POA: Diagnosis present

## 2021-02-20 DIAGNOSIS — E559 Vitamin D deficiency, unspecified: Secondary | ICD-10-CM | POA: Diagnosis present

## 2021-02-20 DIAGNOSIS — N189 Chronic kidney disease, unspecified: Secondary | ICD-10-CM

## 2021-02-20 DIAGNOSIS — R636 Underweight: Secondary | ICD-10-CM | POA: Diagnosis present

## 2021-02-20 DIAGNOSIS — E162 Hypoglycemia, unspecified: Secondary | ICD-10-CM

## 2021-02-20 DIAGNOSIS — Z79899 Other long term (current) drug therapy: Secondary | ICD-10-CM

## 2021-02-20 DIAGNOSIS — E43 Unspecified severe protein-calorie malnutrition: Secondary | ICD-10-CM

## 2021-02-20 DIAGNOSIS — J470 Bronchiectasis with acute lower respiratory infection: Secondary | ICD-10-CM | POA: Diagnosis present

## 2021-02-20 DIAGNOSIS — R64 Cachexia: Secondary | ICD-10-CM | POA: Diagnosis present

## 2021-02-20 DIAGNOSIS — F03918 Unspecified dementia, unspecified severity, with other behavioral disturbance: Secondary | ICD-10-CM

## 2021-02-20 DIAGNOSIS — Z681 Body mass index (BMI) 19 or less, adult: Secondary | ICD-10-CM

## 2021-02-20 DIAGNOSIS — R410 Disorientation, unspecified: Secondary | ICD-10-CM | POA: Diagnosis present

## 2021-02-20 DIAGNOSIS — Z87891 Personal history of nicotine dependence: Secondary | ICD-10-CM

## 2021-02-20 DIAGNOSIS — R531 Weakness: Secondary | ICD-10-CM

## 2021-02-20 DIAGNOSIS — J188 Other pneumonia, unspecified organism: Secondary | ICD-10-CM

## 2021-02-20 DIAGNOSIS — R630 Anorexia: Secondary | ICD-10-CM

## 2021-02-20 DIAGNOSIS — N1831 Chronic kidney disease, stage 3a: Secondary | ICD-10-CM

## 2021-02-20 DIAGNOSIS — R296 Repeated falls: Secondary | ICD-10-CM | POA: Diagnosis present

## 2021-02-20 DIAGNOSIS — Z833 Family history of diabetes mellitus: Secondary | ICD-10-CM

## 2021-02-20 DIAGNOSIS — F3132 Bipolar disorder, current episode depressed, moderate: Secondary | ICD-10-CM | POA: Diagnosis present

## 2021-02-20 DIAGNOSIS — Z20822 Contact with and (suspected) exposure to covid-19: Secondary | ICD-10-CM | POA: Diagnosis present

## 2021-02-20 DIAGNOSIS — N179 Acute kidney failure, unspecified: Secondary | ICD-10-CM | POA: Diagnosis present

## 2021-02-20 DIAGNOSIS — G928 Other toxic encephalopathy: Secondary | ICD-10-CM

## 2021-02-20 DIAGNOSIS — Z888 Allergy status to other drugs, medicaments and biological substances status: Secondary | ICD-10-CM

## 2021-02-20 DIAGNOSIS — F0391 Unspecified dementia with behavioral disturbance: Secondary | ICD-10-CM | POA: Diagnosis present

## 2021-02-20 LAB — COMPREHENSIVE METABOLIC PANEL
ALT: 13 U/L (ref 0–44)
AST: 20 U/L (ref 15–41)
Albumin: 3.3 g/dL — ABNORMAL LOW (ref 3.5–5.0)
Alkaline Phosphatase: 113 U/L (ref 38–126)
Anion gap: 5 (ref 5–15)
BUN: 49 mg/dL — ABNORMAL HIGH (ref 8–23)
CO2: 23 mmol/L (ref 22–32)
Calcium: 9.7 mg/dL (ref 8.9–10.3)
Chloride: 109 mmol/L (ref 98–111)
Creatinine, Ser: 1.15 mg/dL — ABNORMAL HIGH (ref 0.44–1.00)
GFR, Estimated: 46 mL/min — ABNORMAL LOW (ref >60)
Glucose, Bld: 89 mg/dL (ref 70–99)
Potassium: 4.4 mmol/L (ref 3.5–5.1)
Sodium: 137 mmol/L (ref 135–145)
Total Bilirubin: 0.9 mg/dL (ref 0.3–1.2)
Total Protein: 8.1 g/dL (ref 6.5–8.1)

## 2021-02-20 LAB — CBC WITH DIFFERENTIAL/PLATELET
Abs Immature Granulocytes: 0.05 10*3/uL (ref 0.00–0.07)
Basophils Absolute: 0 10*3/uL (ref 0.0–0.1)
Basophils Relative: 0 %
Eosinophils Absolute: 0.2 10*3/uL (ref 0.0–0.5)
Eosinophils Relative: 2 %
HCT: 38.1 % (ref 36.0–46.0)
Hemoglobin: 11.9 g/dL — ABNORMAL LOW (ref 12.0–15.0)
Immature Granulocytes: 0 %
Lymphocytes Relative: 7 %
Lymphs Abs: 0.9 10*3/uL (ref 0.7–4.0)
MCH: 32.8 pg (ref 26.0–34.0)
MCHC: 31.2 g/dL (ref 30.0–36.0)
MCV: 105 fL — ABNORMAL HIGH (ref 80.0–100.0)
Monocytes Absolute: 0.5 10*3/uL (ref 0.1–1.0)
Monocytes Relative: 4 %
Neutro Abs: 10.6 10*3/uL — ABNORMAL HIGH (ref 1.7–7.7)
Neutrophils Relative %: 87 %
Platelets: 297 10*3/uL (ref 150–400)
RBC: 3.63 MIL/uL — ABNORMAL LOW (ref 3.87–5.11)
RDW: 15.4 % (ref 11.5–15.5)
WBC: 12.3 10*3/uL — ABNORMAL HIGH (ref 4.0–10.5)
nRBC: 0 % (ref 0.0–0.2)

## 2021-02-20 LAB — PROCALCITONIN: Procalcitonin: 0.14 ng/mL

## 2021-02-20 LAB — URINALYSIS, COMPLETE (UACMP) WITH MICROSCOPIC
Bilirubin Urine: NEGATIVE
Glucose, UA: NEGATIVE mg/dL
Ketones, ur: NEGATIVE mg/dL
Leukocytes,Ua: NEGATIVE
Nitrite: NEGATIVE
Protein, ur: NEGATIVE mg/dL
Specific Gravity, Urine: 1.018 (ref 1.005–1.030)
Squamous Epithelial / HPF: NONE SEEN (ref 0–5)
pH: 7 (ref 5.0–8.0)

## 2021-02-20 LAB — RESP PANEL BY RT-PCR (FLU A&B, COVID) ARPGX2
Influenza A by PCR: NEGATIVE
Influenza B by PCR: NEGATIVE
SARS Coronavirus 2 by RT PCR: NEGATIVE

## 2021-02-20 LAB — T4, FREE: Free T4: 1.08 ng/dL (ref 0.61–1.12)

## 2021-02-20 LAB — TSH: TSH: 2.548 u[IU]/mL (ref 0.350–4.500)

## 2021-02-20 LAB — LITHIUM LEVEL: Lithium Lvl: 1.32 mmol/L — ABNORMAL HIGH (ref 0.60–1.20)

## 2021-02-20 MED ORDER — IOHEXOL 350 MG/ML SOLN
75.0000 mL | Freq: Once | INTRAVENOUS | Status: AC | PRN
  Administered 2021-02-20: 75 mL via INTRAVENOUS

## 2021-02-20 MED ORDER — MINERAL OIL RE ENEM
1.0000 | ENEMA | Freq: Once | RECTAL | Status: AC
  Administered 2021-02-20: 1 via RECTAL

## 2021-02-20 MED ORDER — LORAZEPAM 2 MG/ML IJ SOLN
0.5000 mg | Freq: Once | INTRAMUSCULAR | Status: AC
  Administered 2021-02-20: 0.5 mg via INTRAVENOUS
  Filled 2021-02-20: qty 1

## 2021-02-20 MED ORDER — LORAZEPAM 2 MG/ML IJ SOLN
0.5000 mg | Freq: Once | INTRAMUSCULAR | Status: AC
  Administered 2021-02-20: 0.5 mg via INTRAVENOUS

## 2021-02-20 MED ORDER — SODIUM CHLORIDE 0.9 % IV BOLUS
1000.0000 mL | Freq: Once | INTRAVENOUS | Status: AC
  Administered 2021-02-20: 1000 mL via INTRAVENOUS

## 2021-02-20 MED ORDER — POLYETHYLENE GLYCOL 3350 17 G PO PACK
17.0000 g | PACK | Freq: Every day | ORAL | Status: AC
  Filled 2021-02-20: qty 1

## 2021-02-20 NOTE — ED Notes (Signed)
Enema given by this RN and Mickel Baas, RN. Patient given new chuck pads and brief. Patient repositioned in bed. Will continue to monitor.

## 2021-02-20 NOTE — ED Triage Notes (Signed)
Pt pleasant and cooperative on arrival to ED. Pt disoriented to time and situation. Pt oriented to person. Pt denies any pain. Pt resting comfortably in wheelchair at this time.

## 2021-02-20 NOTE — Consult Note (Signed)
[facility] Consult   Reason for Consult: Consult for 87 year old woman with a past history of bipolar disorder currently with delirium and altered mental status Referring Physician: Jari Pigg Patient Identification: Sierra Benson MRN:  [id number] Principal Diagnosis: Delirium Diagnosis:  Principal Problem:   Delirium Active Problems:   Bipolar 1 disorder, depressed, moderate ([place])   Total Time spent with patient: 1 hour  Subjective:   Sierra Benson is a 87 y.o. female patient admitted with patient not communicating.  HPI: Patient seen as well as the patient's daughter who is present.  Spoke with emergency room physician reviewed the chart.  87 year old woman brought in by family with reports of altered mental status failure to thrive.  Daughter reports patient has not been sleeping for 1 to 2 weeks.  She has been agitated and increasingly confused and off of her baseline in terms of mental state.  Her alertness will come and go from time to time.  She is suspected of not eating and drinking well.  Daughter reports there have been several recent falls.  She assumes the patient has been compliant with medication but does not know for certain as she does not live with the patient.  Patient was in a hospital bed eyes closed shaking all over.  Did not respond when I spoke her name verbally multiple times nor when I gently touched her on the shoulder.  Not answering any questions.  Medical work-up is underway.  So far looks like some evidence of dehydration.  Past Psychiatric History: Patient has a past history of bipolar disorder.  Apparently she was stable on lithium for many years.  Daughter reports that the patient self discontinued it years ago and went through a period of decompensation but has been back on her medicine recently.  Had a blood level checked in January which was just a little above 1.  Daughter reports that the last hospitalization was probably 72 or more years  ago.  Risk to Self:   Risk to Others:   Prior Inpatient Therapy:   Prior Outpatient Therapy:    Past Medical History:  Past Medical History:  Diagnosis Date  . Bacterial pneumonia 11/2015  . Cystocele   . Glaucoma   . Insomnia   . Menopausal state   . Mitral valve disorder   . Nocturia   . Prediabetes 06/19/2015  . Rectocele   . Vaginal atrophy   . Varicose veins   . Vitamin D deficiency     Past Surgical History:  Procedure Laterality Date  . BREAST BIOPSY Right    benign nodule  . EYE SURGERY Left    cataract removed  . FOOT SURGERY    . VAGINAL HYSTERECTOMY     menorrhagia   Family History:  Family History  Problem Relation Age of Onset  . Diabetes Brother   . Lung cancer Brother   . Breast cancer Sister   . Ovarian cancer Neg Hx   . Colon cancer Neg Hx   . Heart disease Neg Hx    Family Psychiatric  History: No report of other psychiatric history Social History:  Social History   Substance and Sexual Activity  Alcohol Use No     Social History   Substance and Sexual Activity  Drug Use No    Social History   Socioeconomic History  . Marital status: Married    Spouse name: Not on file  . Number of children: Not on file  . Years of education:  Not on file  . Highest education level: Not on file  Occupational History  . Not on file  Tobacco Use  . Smoking status: Former Research scientist (life sciences)  . Smokeless tobacco: Never Used  Vaping Use  . Vaping Use: Never used  Substance and Sexual Activity  . Alcohol use: No  . Drug use: No  . Sexual activity: Yes    Birth control/protection: Surgical  Other Topics Concern  . Not on file  Social History Narrative  . Not on file   Social Determinants of Health   Financial Resource Strain: Not on file  Food Insecurity: Not on file  Transportation Needs: Not on file  Physical Activity: Not on file  Stress: Not on file  Social Connections: Not on file   Additional Social History:    Allergies:   Allergies   Allergen Reactions  . Prednisone Swelling and Other (See Comments)    Insomnia   . Propoxyphene Hives  . Celecoxib Nausea And Vomiting  . Ibuprofen Nausea And Vomiting    Labs:  Results for orders placed or performed during the hospital encounter of 02/20/21 (from the past 48 hour(s))  CBC with Differential     Status: Abnormal   Collection Time: 02/20/21  3:19 PM  Result Value Ref Range   WBC 12.3 (H) 4.0 - 10.5 K/uL   RBC 3.63 (L) 3.87 - 5.11 MIL/uL   Hemoglobin 11.9 (L) 12.0 - 15.0 g/dL   HCT 38.1 36.0 - 46.0 %   MCV 105.0 (H) 80.0 - 100.0 fL   MCH 32.8 26.0 - 34.0 pg   MCHC 31.2 30.0 - 36.0 g/dL   RDW 15.4 11.5 - 15.5 %   Platelets 297 150 - 400 K/uL   nRBC 0.0 0.0 - 0.2 %   Neutrophils Relative % 87 %   Neutro Abs 10.6 (H) 1.7 - 7.7 K/uL   Lymphocytes Relative 7 %   Lymphs Abs 0.9 0.7 - 4.0 K/uL   Monocytes Relative 4 %   Monocytes Absolute 0.5 0.1 - 1.0 K/uL   Eosinophils Relative 2 %   Eosinophils Absolute 0.2 0.0 - 0.5 K/uL   Basophils Relative 0 %   Basophils Absolute 0.0 0.0 - 0.1 K/uL   Immature Granulocytes 0 %   Abs Immature Granulocytes 0.05 0.00 - 0.07 K/uL    Comment: Performed at [facility], [address] [place], [place] [address]  Comprehensive metabolic panel     Status: Abnormal   Collection Time: 02/20/21  3:19 PM  Result Value Ref Range   Sodium 137 135 - 145 mmol/L   Potassium 4.4 3.5 - 5.1 mmol/L   Chloride 109 98 - 111 mmol/L   CO2 23 22 - 32 mmol/L   Glucose, Bld 89 70 - 99 mg/dL    Comment: Glucose reference range applies only to samples taken after fasting for at least 8 hours.   BUN 49 (H) 8 - 23 mg/dL   Creatinine, Ser 1.15 (H) 0.44 - 1.00 mg/dL   Calcium 9.7 8.9 - 10.3 mg/dL   Total Protein 8.1 6.5 - 8.1 g/dL   Albumin 3.3 (L) 3.5 - 5.0 g/dL   AST 20 15 - 41 U/L   ALT 13 0 - 44 U/L   Alkaline Phosphatase 113 38 - 126 U/L   Total Bilirubin 0.9 0.3 - 1.2 mg/dL   GFR, Estimated 46 (L) >60 mL/min    Comment:  (NOTE) Calculated using the CKD-EPI Creatinine Equation (2021)    Anion  gap 5 5 - 15    Comment: Performed at [facility], [place], [place] [address]  Procalcitonin - Baseline     Status: None   Collection Time: 02/20/21  3:19 PM  Result Value Ref Range   Procalcitonin 0.14 ng/mL    Comment:        Interpretation: PCT (Procalcitonin) <= 0.5 ng/mL: Systemic infection (sepsis) is not likely. Local bacterial infection is possible. (NOTE)       Sepsis PCT Algorithm           Lower Respiratory Tract                                      Infection PCT Algorithm    ----------------------------     ----------------------------         PCT < 0.25 ng/mL                PCT < 0.10 ng/mL          Strongly encourage             Strongly discourage   discontinuation of antibiotics    initiation of antibiotics    ----------------------------     -----------------------------       PCT 0.25 - 0.50 ng/mL            PCT 0.10 - 0.25 ng/mL               OR       >80% decrease in PCT            Discourage initiation of                                            antibiotics      Encourage discontinuation           of antibiotics    ----------------------------     -----------------------------         PCT >= 0.50 ng/mL              PCT 0.26 - 0.50 ng/mL               AND        <80% decrease in PCT             Encourage initiation of                                             antibiotics       Encourage continuation           of antibiotics    ----------------------------     -----------------------------        PCT >= 0.50 ng/mL                  PCT > 0.50 ng/mL               AND         increase in PCT                  Strongly encourage  initiation of antibiotics    Strongly encourage escalation           of antibiotics                                     -----------------------------                                           PCT  <= 0.25 ng/mL                                                 OR                                        > 80% decrease in PCT                                      Discontinue / Do not initiate                                             antibiotics  Performed at [facility], [address] [place], [place] [address]   Resp Panel by RT-PCR (Flu A&B, Covid) Nasopharyngeal Swab     Status: None   Collection Time: 02/20/21  4:39 PM   Specimen: Nasopharyngeal Swab; Nasopharyngeal(NP) swabs in vial transport medium  Result Value Ref Range   SARS Coronavirus 2 by RT PCR NEGATIVE NEGATIVE    Comment: (NOTE) SARS-CoV-2 target nucleic acids are NOT DETECTED.  The SARS-CoV-2 RNA is generally detectable in upper respiratory specimens during the acute phase of infection. The lowest concentration of SARS-CoV-2 viral copies this assay can detect is 138 copies/mL. A negative result does not preclude SARS-Cov-2 infection and should not be used as the sole basis for treatment or other patient management decisions. A negative result may occur with  improper specimen collection/handling, submission of specimen other than nasopharyngeal swab, presence of viral mutation(s) within the areas targeted by this assay, and inadequate number of viral copies(<138 copies/mL). A negative result must be combined with clinical observations, patient history, and epidemiological information. The expected result is Negative.  Fact Sheet for Patients:  EntrepreneurPulse.com.au  Fact Sheet for Healthcare Providers:  IncredibleEmployment.be  This test is no t yet approved or cleared by the [place] FDA and  has been authorized for detection and/or diagnosis of SARS-CoV-2 by FDA under an Emergency Use Authorization (EUA). This EUA will remain  in effect (meaning this test can be used) for the duration of the COVID-19 declaration under Section 564(b)(1) of the Act,  21 U.S.C.section 360bbb-3(b)(1), unless the authorization is terminated  or revoked sooner.       Influenza A by PCR NEGATIVE NEGATIVE   Influenza B by PCR NEGATIVE NEGATIVE    Comment: (NOTE) The Xpert Xpress SARS-CoV-2/FLU/RSV plus assay is intended as an aid in the diagnosis of influenza from  Nasopharyngeal swab specimens and should not be used as a sole basis for treatment. Nasal washings and aspirates are unacceptable for Xpert Xpress SARS-CoV-2/FLU/RSV testing.  Fact Sheet for Patients: EntrepreneurPulse.com.au  Fact Sheet for Healthcare Providers: IncredibleEmployment.be  This test is not yet approved or cleared by the [place] FDA and has been authorized for detection and/or diagnosis of SARS-CoV-2 by FDA under an Emergency Use Authorization (EUA). This EUA will remain in effect (meaning this test can be used) for the duration of the COVID-19 declaration under Section 564(b)(1) of the Act, 21 U.S.C. section 360bbb-3(b)(1), unless the authorization is terminated or revoked.  Performed at [facility], [address] [place], [place] [address]     Current Facility-Administered Medications  Medication Dose Route Frequency Provider Last Rate Last Admin  . iohexol (OMNIPAQUE) 350 MG/ML injection 75 mL  75 mL Intravenous Once PRN Vanessa [place], MD       Current Outpatient Medications  Medication Sig Dispense Refill  . bimatoprost (LUMIGAN) 0.01 % SOLN Place 1 drop into both eyes at bedtime.     . cephALEXin (KEFLEX) 250 MG capsule Take 1 capsule (250 mg total) by mouth 4 (four) times daily. 28 capsule 0  . lithium 300 MG tablet Take 1 tablet (300 mg total) by mouth at bedtime. (Patient taking differently: Take 300 mg by mouth daily. ) 30 tablet 2  . magnesium oxide (MAG-OX) 400 MG tablet Take 400 mg by mouth daily.    . Multiple Vitamins-Minerals (MULTIVITAMIN ADULT PO) Take 1 tablet by mouth daily.     Marland Kitchen Respiratory  Therapy Supplies (FLUTTER) DEVI Use 10-15 times daily 1 each 0  . timolol (TIMOPTIC) 0.25 % ophthalmic solution Place 1 drop into both eyes 2 (two) times daily.       Musculoskeletal: Strength & Muscle Tone: decreased Gait & Station: unable to stand Patient leans: N/A  Psychiatric Specialty Exam: Physical Exam Vitals and nursing note reviewed.  Constitutional:      Appearance: She is well-developed and well-nourished. She is ill-appearing.  HENT:     Head: Normocephalic and atraumatic.  Eyes:     Conjunctiva/sclera: Conjunctivae normal.     Pupils: Pupils are equal, round, and reactive to light.  Cardiovascular:     Heart sounds: Normal heart sounds.  Pulmonary:     Effort: Pulmonary effort is normal.  Abdominal:     Palpations: Abdomen is soft.  Musculoskeletal:        General: Normal range of motion.     Cervical back: Normal range of motion.  Skin:    General: Skin is warm and dry.  Psychiatric:        Attention and Perception: She is inattentive.     Review of Systems  Unable to perform ROS: Patient unresponsive    Blood pressure (!) 156/75, pulse 91, temperature 97.7 F (36.5 C), temperature source Oral, resp. rate 12, height 4\' 11"  (1.499 m), weight 39.9 kg, SpO2 95 %.Body mass index is 17.77 kg/m.  General Appearance: Disheveled  Eye Contact:  None  Speech:  Negative  Volume:  Decreased  Mood:  Negative  Affect:  Negative  Thought Process:  NA  Orientation:  Negative  Thought Content:  Negative  Suicidal Thoughts:  Unknown but no report of it  Homicidal Thoughts:  No  Memory:  Negative  Judgement:  Negative  Insight:  Negative  Psychomotor Activity:  Negative  Concentration:  Concentration: Negative  Recall:  Negative  Fund of Knowledge:  Negative  Language:  Negative  Akathisia:  No  Handed:  Right  AIMS (if indicated):     Assets:  Housing Social Support  ADL's:  Impaired  Cognition:  Impaired,  Moderate and Severe  Sleep:         Treatment Plan Summary: Plan To my evaluation the patient is currently delirious with altered mental state unresponsive unable to provide any history.  Apparently she was doing a little better earlier in the afternoon when she first got here and was able to speak and ambulate a little bit.  She had to be given some Ativan to try to facilitate CT scans which may be causing part of the current mental state.  Patient in any case at the moment appears acutely delirious.  Lithium level and other work-up still pending.  Daughter emphasizes that they cannot take care of the patient at home in her current condition.  Reviewed case with emergency room doctor.  If no indication is found for medical hospitalization psychiatry will continue to follow-up if appropriate.  If mental status improves she might be in a condition that would be appropriate for geriatric psychiatry although at the moment she would be unlikely to be admittable because of acute delirium.  Disposition: Supportive therapy provided about ongoing stressors.  Alethia Berthold, MD 02/20/2021 6:22 PM

## 2021-02-20 NOTE — ED Notes (Signed)
Patient agitated and uncooperative for CT scan. Will attempt scans again after medication.

## 2021-02-20 NOTE — ED Triage Notes (Signed)
First Nurse Note:  Started new RX of Seroquel since 2/28.  Per EMS report, Home Health was at home, possible recent CXR that indicated pneumonia.  Not on antibiotics.  Patient has apparently been wandering at night, for EMS AAOx3.  Skin warm and dry.

## 2021-02-20 NOTE — ED Notes (Signed)
Patient to CT at this time

## 2021-02-20 NOTE — ED Notes (Signed)
Psych MD at bedside

## 2021-02-20 NOTE — ED Notes (Signed)
Daughter at bedside.

## 2021-02-20 NOTE — ED Notes (Signed)
Per tech, pt trying to get out of bed, bed alarm placed on pt

## 2021-02-20 NOTE — ED Notes (Signed)
Patient back from CT at this time. Patient sleeping. No signs of distress. Will continue to monitor.

## 2021-02-20 NOTE — ED Notes (Signed)
Patient has no complaints at this time and is unsure why family sent her to ED today.

## 2021-02-20 NOTE — ED Notes (Signed)
Lab at bedside to collect lithium level.

## 2021-02-20 NOTE — ED Notes (Signed)
Patient wet bed. Patient cleaned up, bed changed, brief applied. Warm blankets given.

## 2021-02-20 NOTE — ED Provider Notes (Addendum)
[facility] Emergency Department Provider Note  ____________________________________________   Event Date/Time   First MD Initiated Contact with Patient 02/20/21 1512     (approximate)  I have reviewed the triage vital signs and the nursing notes.   HISTORY  Chief Complaint Weakness    HPI Sierra Benson is a 87 y.o. female with insomnia, mitral valve disorder who comes in for weakness.  Patient states that she is doing fine.  She does not know why she is here today.  She is alert and oriented x3 and denies any issues at this time.  Will call family to get some collateral information  Attempted to call Mr. Bare[place] no answer.  Attempted to call daughter Sierra Benson who is on her way here.  Discussed with Sierra Benson who stated that patient has been up all night not sleeping for multiple nights.  She has a history of bipolar and she has been compliant with her lithium.  She states that there was a chest x-ray done a few days ago that was concerning for pneumonia and she wanted it to be followed up with her pulmonologist but and never got it looked at and so she does not know if she has a pneumonia.  She reports that she is not been eating or drinking, she is got weakness, multiple falls, complaining of pain all over, more confused than normal.  She does not feel like patient safe to go home.  On review of record this is pt third visit for similar thing and family reports progressive decline in mother. Was started on seroquel by PCP but no improvement and family thinks it is worse.             Past Medical History:  Diagnosis Date  . Bacterial pneumonia 11/2015  . Cystocele   . Glaucoma   . Insomnia   . Menopausal state   . Mitral valve disorder   . Nocturia   . Prediabetes 06/19/2015  . Rectocele   . Vaginal atrophy   . Varicose veins   . Vitamin D deficiency     Patient Active Problem List   Diagnosis Date Noted  . Crushing injury of finger  06/10/2017  . Mucoid impaction of bronchi 06/24/2016  . Pseudophakia of left eye 04/27/2016  . Bipolar 1 disorder, depressed, moderate ([place])   . Right lower quadrant pain 10/29/2015  . Hemorrhage of anus and rectum 10/29/2015  . History of palpitations 06/19/2015  . BP (high blood pressure) 06/19/2015  . Underweight due to inadequate caloric intake 06/19/2015  . Prediabetes 06/19/2015  . Primary open angle glaucoma of left eye, severe stage 01/09/2015  . 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 Cataract 06/27/2012    Past Surgical History:  Procedure Laterality Date  . BREAST BIOPSY Right    benign nodule  . EYE SURGERY Left    cataract removed  . FOOT SURGERY    . VAGINAL HYSTERECTOMY     menorrhagia    Prior to Admission medications   Medication Sig Start Date End Date Taking? Authorizing Provider  bimatoprost (LUMIGAN) 0.01 % SOLN Place 1 drop into both eyes at bedtime.     [provider]  cephALEXin (KEFLEX) 250 MG capsule Take 1 capsule (250 mg total) by mouth 4 (four) times daily. 06/23/18   Hayden Rasmussen, MD  lithium 300 MG tablet Take 1 tablet (300 mg total) by mouth at bedtime. Patient taking differently: Take 300 mg by mouth daily.  12/10/15  06/23/18  Earleen Newport, MD  magnesium oxide (MAG-OX) 400 MG tablet Take 400 mg by mouth daily.    [provider]  Multiple Vitamins-Minerals (MULTIVITAMIN ADULT PO) Take 1 tablet by mouth daily.     [provider]  Respiratory Therapy Supplies (FLUTTER) DEVI Use 10-15 times daily 10/20/17   Wilhelmina Mcardle, MD  timolol (TIMOPTIC) 0.25 % ophthalmic solution Place 1 drop into both eyes 2 (two) times daily.     [provider]    Allergies Prednisone, Propoxyphene, Celecoxib, and Ibuprofen  Family History  Problem Relation Age of Onset  . Diabetes Brother   . Lung cancer Brother   . Breast cancer Sister   . Ovarian cancer Neg Hx   . Colon cancer Neg Hx   . Heart disease Neg Hx      Social History Social History   Tobacco Use  . Smoking status: Former Research scientist (life sciences)  . Smokeless tobacco: Never Used  Vaping Use  . Vaping Use: Never used  Substance Use Topics  . Alcohol use: No  . Drug use: No      Review of Systems Constitutional: No fever/chills, pain all over, falls Eyes: No visual changes. ENT: No sore throat. Cardiovascular: Denies chest pain. Respiratory: Denies shortness of breath.  Positive coughing Gastrointestinal: No abdominal pain.  No nausea, no vomiting.  Positive diarrhea no constipation. Genitourinary: Negative for dysuria. Musculoskeletal: Negative for back pain. Skin: Negative for rash. Neurological: Negative for headaches, focal weakness or numbness.  Positive confusion All other ROS negative ____________________________________________   PHYSICAL EXAM:  VITAL SIGNS: ED Triage Vitals [02/20/21 1504]  Enc Vitals Group     BP (!) 150/59     Pulse Rate 70     Resp 20     Temp 97.7 F (36.5 C)     Temp Source Oral     SpO2 97 %     Weight 88 lb (39.9 kg)     Height _0  (1.499 m)     Head Circumference      Peak Flow      Pain Score 0     Pain Loc      Pain Edu?      Excl. in [place]?     Constitutional: Alert and oriented. Well appearing and in no acute distress. Eyes: Conjunctivae are normal. EOMI. Head: Atraumatic. Nose: No congestion/rhinnorhea. Mouth/Throat: Mucous membranes are moist.   Neck: No stridor. Trachea Midline. FROM Cardiovascular: Normal rate, regular rhythm. Grossly normal heart sounds.  Good peripheral circulation. Respiratory: Normal respiratory effort.  No retractions. Lungs CTAB. Gastrointestinal: Soft and nontender. No distention. No abdominal bruits.  Musculoskeletal: No lower extremity tenderness nor edema.  No joint effusions. Neurologic:  Normal speech and language. No gross focal neurologic deficits are appreciated.  Moving her arms and legs. Skin:  Skin is warm, dry and intact. No rash  noted. Psychiatric: Mood and affect are normal. Speech and behavior are normal. GU: Deferred   ____________________________________________   LABS (all labs ordered are listed, but only abnormal results are displayed)  Labs Reviewed  CBC WITH DIFFERENTIAL/PLATELET - Abnormal; Notable for the following components:      Result Value   WBC 12.3 (*)    RBC 3.63 (*)    Hemoglobin 11.9 (*)    MCV 105.0 (*)    Neutro Abs 10.6 (*)    All other components within normal limits  COMPREHENSIVE METABOLIC PANEL - Abnormal; Notable for the following components:  BUN 49 (*)    Creatinine, Ser 1.15 (*)    Albumin 3.3 (*)    GFR, Estimated 46 (*)    All other components within normal limits  RESP PANEL BY RT-PCR (FLU A&B, COVID) [id number]  PROCALCITONIN  URINALYSIS, COMPLETE (UACMP) WITH MICROSCOPIC  LITHIUM LEVEL   ____________________________________________   ED ECG REPORT I, Vanessa [place], the attending physician, personally viewed and interpreted this ECG.  EKG is difficult to interpret for patient was moving all around but I do not see any acute findings on it and intervals with grossly normal without any obvious ST elevation or T wave inversion   ____________________________________________  RADIOLOGY   Official radiology report(s): CT Head Wo Contrast  Result Date: 02/20/2021 CLINICAL DATA:  Head trauma EXAM: CT HEAD WITHOUT CONTRAST TECHNIQUE: Contiguous axial images were obtained from the base of the skull through the vertex without intravenous contrast. COMPARISON:  01/12/2021 FINDINGS: Brain: No acute infarct or hemorrhage. Lateral ventricles and midline structures are stable. No acute extra-axial fluid collections. No mass effect. Vascular: No hyperdense vessel or unexpected calcification. Skull: Normal. Negative for fracture or focal lesion. Sinuses/Orbits: Minimal fluid within the right maxillary sinus. Remaining paranasal sinuses are clear. Postsurgical changes left orbit.  Other: None. IMPRESSION: 1. No acute intracranial process. 2. Minimal right maxillary sinus disease. Electronically Signed   By: Randa Ngo M.D.   On: 02/20/2021 19:04   CT Angio Chest PE W and/or Wo Contrast  Result Date: 02/20/2021 CLINICAL DATA:  PE suspected, high prob No additional relevant history provided or available. EXAM: CT ANGIOGRAPHY CHEST WITH CONTRAST TECHNIQUE: Multidetector CT imaging of the chest was performed using the standard protocol during bolus administration of intravenous contrast. Multiplanar CT image reconstructions and MIPs were obtained to evaluate the vascular anatomy. CONTRAST:  30m OMNIPAQUE IOHEXOL 350 MG/ML SOLN COMPARISON:  Most recent chest radiograph 01/12/2021. Chest CT 11/03/2017 FINDINGS: Cardiovascular: There are no filling defects within the pulmonary arteries to suggest pulmonary embolus. Mild aortic atherosclerosis and tortuosity. Cardiomegaly with primarily right heart dilatation. Mild reflux of contrast into the IVC. No pericardial effusion. Mediastinum/Nodes: 12 mm right hilar node, comparison to priors difficult due to differences in technique. No left hilar adenopathy. 13 mm prominent subcarinal node. No thyroid nodule. Patulous esophagus. Lungs/Pleura: Multifocal bronchiectasis that is progressed from prior exam. Areas of bronchial filling involving the right lower lobe. There are multifocal nodular, tree-in-bud, and consolidative airspace opacities throughout both lungs. Many of these consolidative areas are calcified or high-density. Overall interval progression from 2018 CT. Similar trace left pleural effusion. No findings of pulmonary edema. Upper Abdomen: Assessed on concurrent abdominal CT, reported separately. Musculoskeletal: Scoliosis and exaggerated thoracic kyphosis. Multilevel degenerative change in the spine. There are no acute or suspicious osseous abnormalities. Review of the MIP images confirms the above findings. IMPRESSION: 1. No pulmonary  embolus. 2. Progressive multifocal bronchiectasis with multifocal nodular, tree-in-bud, and consolidative airspace opacities throughout both lungs. Definite progression in parenchymal findings from 2018. Findings are most consistent with progression of chronic indolent infection infection, Mycobacterium avium complex. 3. Cardiomegaly with primarily right heart dilatation. Mild reflux of contrast into the IVC consistent with elevated right heart pressures. 4. Mild mediastinal and right hilar adenopathy is likely reactive. 5. Trace left pleural effusion. Aortic Atherosclerosis (ICD10-I70.0). Electronically Signed   By: MKeith RakeM.D.   On: 02/20/2021 19:14   CT Cervical Spine Wo Contrast  Result Date: 02/20/2021 CLINICAL DATA:  Neck trauma EXAM: CT CERVICAL SPINE WITHOUT CONTRAST TECHNIQUE:  Multidetector CT imaging of the cervical spine was performed without intravenous contrast. Multiplanar CT image reconstructions were also generated. COMPARISON:  03/03/2011 FINDINGS: Alignment: Minimal anterolisthesis of C4 relative to C5, stable. Otherwise alignment is anatomic. Skull base and vertebrae: No acute fracture. No primary bone lesion or focal pathologic process. Soft tissues and spinal canal: No prevertebral fluid or swelling. No visible canal hematoma. Disc levels: There is mild diffuse cervical spondylosis greatest at C5-6. Prominent facet hypertrophic changes greatest at C3-4 and C4-5. Upper chest: Airway is patent. Patchy areas of airspace disease are seen at the apices. No effusion or pneumothorax. Other: Reconstructed images demonstrate no additional findings. IMPRESSION: 1. No acute cervical spine fracture. 2. Multilevel cervical spondylosis unchanged. 3. Patchy bilateral upper lobe airspace disease. Electronically Signed   By: Randa Ngo M.D.   On: 02/20/2021 19:08   CT ABDOMEN PELVIS W CONTRAST  Result Date: 02/20/2021 CLINICAL DATA:  Abdominal distension. EXAM: CT ABDOMEN AND PELVIS WITH  CONTRAST TECHNIQUE: Multidetector CT imaging of the abdomen and pelvis was performed using the standard protocol following bolus administration of intravenous contrast. CONTRAST:  6m OMNIPAQUE IOHEXOL 350 MG/ML SOLN COMPARISON:  No prior abdominal imaging. Chest CT performed concurrently, reported separately. FINDINGS: Lower chest: Assessed on concurrent chest CT, reported separately. Hepatobiliary: Mild contrast refluxing into the hepatic veins and IVC. No focal hepatic lesion. Unremarkable gallbladder. No calcified gallstone or findings of cholecystitis. Pancreas: Parenchymal atrophy. Pancreas is partially obscured by motion. No evidence of pancreatic inflammation, ductal dilatation or mass. Spleen: Normal in size without focal abnormality. Adrenals/Urinary Tract: No adrenal nodule. No hydronephrosis or perinephric edema. Small left renal cyst. Symmetric excretion on delayed phase imaging. Urinary bladder is unremarkable. Stomach/Bowel: Bowel evaluation is limited in the absence of enteric contrast, paucity of intra-abdominal fat, and patient motion. Stomach is grossly unremarkable. There is no small bowel dilatation or evidence of obstruction. Moderate large volume of stool throughout the colon. There is stool distending the rectum. Mild stranding of the perirectal fat. Appendix not confidently visualized, no evidence of appendicitis. Vascular/Lymphatic: Aortic atherosclerosis and tortuosity. Patent portal vein. No enlarged abdominopelvic lymph nodes. Reproductive: Hysterectomy.  No evidence of adnexal mass. Other: Trace free fluid in the pelvis. No free air or focal abscess. No abdominal wall hernia. Musculoskeletal: Scoliosis and degenerative change in the spine. There are no acute or suspicious osseous abnormalities. IMPRESSION: 1. Moderate large volume of stool throughout the colon with stool distending the rectum, suggesting fecal impaction. Mild stranding of the perirectal fat. No obstruction or  perforation. 2. Trace free fluid in the pelvis is likely reactive. 3. Mild contrast refluxing into the hepatic veins and IVC, can be seen with right heart failure. Aortic Atherosclerosis (ICD10-I70.0). Electronically Signed   By: MKeith RakeM.D.   On: 02/20/2021 19:17    ____________________________________________   PROCEDURES  Procedure(s) performed (including Critical Care):  Procedures  ------------------------------------------------------------------------------------------------------------------- Fecal Disimpaction Procedure Note:  Performed by me:  Patient placed in the lateral recumbent position with knees drawn towards chest. Nurse present for patient support. Large amount of hard brown stool removed. No complications during procedure.   ------------------------------------------------------------------------------------------------------------------   ____________________________________________   INITIAL IMPRESSION / ASSESSMENT AND PLAN / ED COURSE  DAIZZA SANTIAGOwas evaluated in Emergency Department on 02/20/2021 for the symptoms described in the history of present illness. She was evaluated in the context of the global COVID-19 pandemic, which necessitated consideration that the patient might be at risk for infection with the SARS-CoV-2 virus that causes COVID-19.  Institutional protocols and algorithms that pertain to the evaluation of patients at risk for COVID-19 are in a state of rapid change based on information released by regulatory bodies including the CDC and federal and state organizations. These policies and algorithms were followed during the patient's care in the ED.    Patient is an 87 year old with bipolar who comes in with many issues including not sleeping, not eating or drinking, pain all over, not acting her normal self, recurrent falls and daughter is concerned that she can no longer live at home.  When I initially evaluated patient she was  alert and oriented x3, following commands and appeared to be in no acute distress.  However when I returned to the room with the daughter in the room patient was moaning saying she had pain all over and was not following commands.  Her mental status definitely seems to be waxing and waning.  Given patient not able to give a great history of what is going on I think we should proceed with CT pan scan given the recurrent falls to make sure no signs of traumatic injury as well as rule out blood clots, cancer, other acute pathology.  Labs were ordered to evaluate for Electra abnormalities, AKI, UTI.  I also think that patient needs a psychiatric consult given this could be psychiatric in nature if her work-up is otherwise normal.  Patient unable to get CTs due to agitation.  We will give 0.5 of Ativan to help facilitate.  White count is elevated but downtrending from 1 month ago.  Kidney function slightly elevated at 1.15 so given 1 L of fluid   CT head negative for acute finding CT cervical negative  CT PE shows progression of her new bronchiolectasis.  I discussed with pulmonary Dr. Simonne Maffucci patient has had these findings previously and given she is not hypoxic, no respiratory distress and procalcitonin is negative can hold off on any additional treatment but just needs a follow-up with pulmonary outpatient.  CT abdomen pelvis shows moderate large volume of stool throughout the colon.  Will do rectal exam which some stool was removed.  Will do enema to help get the rest.  I did consent the daughter to do this. Will start on miralax.   Patient is work-up does not show a medical indication for admission and she is had progressive decline and I wonder if there is a psychiatric component to this versus dementia.  At this time family does not feel comfortable taking patient home.  We will have at hand patient off for psychiatric evaluation tomorrow and patient will be a border at this time  The patient has  been placed in psychiatric observation due to the need to provide a safe environment for the patient while obtaining psychiatric consultation and evaluation, as well as ongoing medical and medication management to treat the patient's condition.  The patient has not been placed under full IVC at this time.   We will hold lithium given elevated lithium level as well as hold the Seroquel given family reports that it seemed to just make her more active.       ____________________________________________   FINAL CLINICAL IMPRESSION(S) / ED DIAGNOSES   Final diagnoses:  Constipation, unspecified constipation type  Weakness  Bipolar 1 disorder ([place])      MEDICATIONS GIVEN DURING THIS VISIT:  Medications  mineral oil enema 1 enema (has no administration in time range)  sodium chloride 0.9 % bolus 1,000 mL (0 mLs Intravenous  Stopped 02/20/21 1857)  iohexol (OMNIPAQUE) 350 MG/ML injection 75 mL (75 mLs Intravenous Contrast Given 02/20/21 1853)  LORazepam (ATIVAN) injection 0.5 mg (0.5 mg Intravenous Given 02/20/21 1726)  LORazepam (ATIVAN) injection 0.5 mg (0.5 mg Intravenous Given 02/20/21 1756)     ED Discharge Orders    None       Note:  This document was prepared using Dragon voice recognition software and may include unintentional dictation errors.   Vanessa [place], MD 02/20/21 2038    Vanessa [place], MD 02/20/21 2139    Vanessa [place], MD 02/20/21 2239    Vanessa [place], MD 02/21/21 [phone]

## 2021-02-20 NOTE — ED Notes (Signed)
Patient sleeping at this time. Respirations even and unlabored. No signs of distress. Will continue to monitor.

## 2021-02-21 DIAGNOSIS — N189 Chronic kidney disease, unspecified: Secondary | ICD-10-CM | POA: Diagnosis not present

## 2021-02-21 DIAGNOSIS — J479 Bronchiectasis, uncomplicated: Secondary | ICD-10-CM

## 2021-02-21 DIAGNOSIS — J470 Bronchiectasis with acute lower respiratory infection: Secondary | ICD-10-CM

## 2021-02-21 DIAGNOSIS — N179 Acute kidney failure, unspecified: Secondary | ICD-10-CM | POA: Diagnosis present

## 2021-02-21 DIAGNOSIS — F3132 Bipolar disorder, current episode depressed, moderate: Secondary | ICD-10-CM

## 2021-02-21 DIAGNOSIS — R64 Cachexia: Secondary | ICD-10-CM | POA: Diagnosis present

## 2021-02-21 DIAGNOSIS — R410 Disorientation, unspecified: Secondary | ICD-10-CM | POA: Diagnosis present

## 2021-02-21 DIAGNOSIS — D539 Nutritional anemia, unspecified: Secondary | ICD-10-CM | POA: Diagnosis present

## 2021-02-21 DIAGNOSIS — R1310 Dysphagia, unspecified: Secondary | ICD-10-CM | POA: Diagnosis present

## 2021-02-21 DIAGNOSIS — F0391 Unspecified dementia with behavioral disturbance: Secondary | ICD-10-CM | POA: Diagnosis present

## 2021-02-21 DIAGNOSIS — J9601 Acute respiratory failure with hypoxia: Secondary | ICD-10-CM | POA: Diagnosis not present

## 2021-02-21 DIAGNOSIS — R7989 Other specified abnormal findings of blood chemistry: Secondary | ICD-10-CM | POA: Diagnosis not present

## 2021-02-21 DIAGNOSIS — Z7189 Other specified counseling: Secondary | ICD-10-CM | POA: Diagnosis not present

## 2021-02-21 DIAGNOSIS — A310 Pulmonary mycobacterial infection: Secondary | ICD-10-CM | POA: Diagnosis present

## 2021-02-21 DIAGNOSIS — J189 Pneumonia, unspecified organism: Secondary | ICD-10-CM | POA: Diagnosis present

## 2021-02-21 DIAGNOSIS — E162 Hypoglycemia, unspecified: Secondary | ICD-10-CM | POA: Diagnosis not present

## 2021-02-21 DIAGNOSIS — E860 Dehydration: Secondary | ICD-10-CM | POA: Diagnosis present

## 2021-02-21 DIAGNOSIS — J690 Pneumonitis due to inhalation of food and vomit: Secondary | ICD-10-CM | POA: Diagnosis not present

## 2021-02-21 DIAGNOSIS — N1831 Chronic kidney disease, stage 3a: Secondary | ICD-10-CM | POA: Diagnosis present

## 2021-02-21 DIAGNOSIS — G928 Other toxic encephalopathy: Secondary | ICD-10-CM | POA: Diagnosis present

## 2021-02-21 DIAGNOSIS — R296 Repeated falls: Secondary | ICD-10-CM | POA: Diagnosis present

## 2021-02-21 DIAGNOSIS — R630 Anorexia: Secondary | ICD-10-CM | POA: Diagnosis not present

## 2021-02-21 DIAGNOSIS — Z20822 Contact with and (suspected) exposure to covid-19: Secondary | ICD-10-CM | POA: Diagnosis present

## 2021-02-21 DIAGNOSIS — Z888 Allergy status to other drugs, medicaments and biological substances status: Secondary | ICD-10-CM | POA: Diagnosis not present

## 2021-02-21 DIAGNOSIS — R627 Adult failure to thrive: Secondary | ICD-10-CM | POA: Diagnosis present

## 2021-02-21 DIAGNOSIS — Z9183 Wandering in diseases classified elsewhere: Secondary | ICD-10-CM | POA: Diagnosis not present

## 2021-02-21 DIAGNOSIS — Z681 Body mass index (BMI) 19 or less, adult: Secondary | ICD-10-CM | POA: Diagnosis not present

## 2021-02-21 DIAGNOSIS — Z515 Encounter for palliative care: Secondary | ICD-10-CM | POA: Diagnosis not present

## 2021-02-21 DIAGNOSIS — E43 Unspecified severe protein-calorie malnutrition: Secondary | ICD-10-CM | POA: Diagnosis present

## 2021-02-21 DIAGNOSIS — Z66 Do not resuscitate: Secondary | ICD-10-CM | POA: Diagnosis not present

## 2021-02-21 MED ORDER — HALOPERIDOL LACTATE 5 MG/ML IJ SOLN
1.0000 mg | Freq: Three times a day (TID) | INTRAMUSCULAR | Status: DC | PRN
  Administered 2021-02-21 – 2021-02-22 (×2): 1 mg via INTRAMUSCULAR
  Filled 2021-02-21 (×3): qty 1

## 2021-02-21 MED ORDER — ONDANSETRON HCL 4 MG/2ML IJ SOLN: 4.0000 mg | Freq: Four times a day (QID) | INTRAMUSCULAR | Status: DC | PRN

## 2021-02-21 MED ORDER — AZITHROMYCIN 500 MG IV SOLR
500.0000 mg | INTRAVENOUS | Status: AC
  Administered 2021-02-21 – 2021-02-25 (×5): 500 mg via INTRAVENOUS
  Filled 2021-02-21 (×6): qty 500

## 2021-02-21 MED ORDER — HALOPERIDOL LACTATE 5 MG/ML IJ SOLN
INTRAMUSCULAR | Status: AC
  Filled 2021-02-21: qty 1

## 2021-02-21 MED ORDER — QUETIAPINE FUMARATE 25 MG PO TABS: 50.0000 mg | ORAL_TABLET | Freq: Every day | ORAL | Status: DC

## 2021-02-21 MED ORDER — ONDANSETRON HCL 4 MG PO TABS: 4.0000 mg | ORAL_TABLET | Freq: Four times a day (QID) | ORAL | Status: DC | PRN

## 2021-02-21 MED ORDER — ADULT MULTIVITAMIN W/MINERALS CH
ORAL_TABLET | Freq: Every day | ORAL | Status: DC
  Filled 2021-02-21 (×2): qty 1

## 2021-02-21 MED ORDER — QUETIAPINE FUMARATE 25 MG PO TABS: 25.0000 mg | ORAL_TABLET | Freq: Three times a day (TID) | ORAL | Status: DC | PRN

## 2021-02-21 MED ORDER — HALOPERIDOL LACTATE 5 MG/ML IJ SOLN
2.5000 mg | Freq: Once | INTRAMUSCULAR | Status: AC
  Administered 2021-02-21: 2.5 mg via INTRAVENOUS

## 2021-02-21 MED ORDER — DROPERIDOL 2.5 MG/ML IJ SOLN
2.5000 mg | Freq: Once | INTRAMUSCULAR | Status: AC
  Administered 2021-02-21: 2.5 mg via INTRAVENOUS

## 2021-02-21 MED ORDER — MELATONIN 3 MG PO TABS
3.0000 mg | ORAL_TABLET | Freq: Every day | ORAL | Status: DC
  Filled 2021-02-21 (×5): qty 1

## 2021-02-21 MED ORDER — BISACODYL 10 MG RE SUPP
10.0000 mg | Freq: Once | RECTAL | Status: AC
  Administered 2021-02-21: 10 mg via RECTAL
  Filled 2021-02-21 (×2): qty 1

## 2021-02-21 MED ORDER — ENOXAPARIN SODIUM 40 MG/0.4ML ~~LOC~~ SOLN: 40.0000 mg | SUBCUTANEOUS | Status: DC

## 2021-02-21 MED ORDER — ENOXAPARIN SODIUM 30 MG/0.3ML ~~LOC~~ SOLN
30.0000 mg | SUBCUTANEOUS | Status: DC
  Administered 2021-02-21 – 2021-02-28 (×8): 30 mg via SUBCUTANEOUS
  Filled 2021-02-21 (×9): qty 0.3

## 2021-02-21 MED ORDER — SODIUM CHLORIDE 0.9 % IV SOLN
2.0000 g | INTRAVENOUS | Status: AC
  Administered 2021-02-21 – 2021-02-25 (×5): 2 g via INTRAVENOUS
  Filled 2021-02-21: qty 2
  Filled 2021-02-21 (×2): qty 20
  Filled 2021-02-21 (×2): qty 2
  Filled 2021-02-21: qty 20

## 2021-02-21 MED ORDER — DEXTROSE-NACL 5-0.45 % IV SOLN: INTRAVENOUS | Status: DC

## 2021-02-21 MED ORDER — LATANOPROST 0.005 % OP SOLN
1.0000 [drp] | Freq: Every day | OPHTHALMIC | Status: DC
  Filled 2021-02-21 (×2): qty 2.5

## 2021-02-21 MED ORDER — SODIUM CHLORIDE 0.9 % IV SOLN: 1.0000 g | Freq: Once | INTRAVENOUS | Status: DC

## 2021-02-21 MED ORDER — SODIUM CHLORIDE 0.9 % IV SOLN: 500.0000 mg | Freq: Once | INTRAVENOUS | Status: DC

## 2021-02-21 MED ORDER — ACETAMINOPHEN 650 MG RE SUPP: 650.0000 mg | Freq: Four times a day (QID) | RECTAL | Status: DC | PRN

## 2021-02-21 MED ORDER — ACETAMINOPHEN 325 MG PO TABS: 650.0000 mg | ORAL_TABLET | Freq: Four times a day (QID) | ORAL | Status: DC | PRN

## 2021-02-21 NOTE — Consult Note (Signed)
[facility] Consult   Reason for Consult:  Delrium, agitation Referring Physician:  Dr. Francine Graven Patient Identification: Sierra Benson MRN:  [id number] Principal Diagnosis: Delirium Diagnosis:  Principal Problem:   Delirium Active Problems:   Bipolar 1 disorder, depressed, moderate ([name])   Total Time spent with patient: 20 minutes  Subjective:   Sierra Benson is a 87 y.o. female patient admitted with delirium and altered mental status.  HPI:  87 year old female admitted for altered mental status and failure to thrive. Today, patient only partially awakens to voice and physical stimulation to leg and arm. She groans incoherently, and rolls over. However, she is unable to answer any questions. Notable tremor on exam. She is unable to answer yes or no about whether she is in pain or feeling cold. Lithium level noted to be elevated, and recommend continuing to hold medication.   Per chart review, daughter had reported that she was not sleeping for 1-2 weeks. She was increasingly agitated and confused from her baseline. Her alertness waxed and waned. She has not been eating or drinking well, and daughter feels there were several recent falls.   Past Psychiatric History: Per chart review: "Patient has a past history of bipolar disorder.  Apparently she was stable on lithium for many years.  Daughter reports that the patient self discontinued it years ago and went through a period of decompensation but has been back on her medicine recently.  Had a blood level checked in January which was just a little above 1.  Daughter reports that the last hospitalization was probably 50 or more years ago."  Risk to Self:   Risk to Others:   Prior Inpatient Therapy:   Prior Outpatient Therapy:    Past Medical History:  Past Medical History:  Diagnosis Date  . Bacterial pneumonia 11/2015  . Cystocele   . Glaucoma   . Insomnia   . Menopausal state   . Mitral valve disorder   .  Nocturia   . Prediabetes 06/19/2015  . Rectocele   . Vaginal atrophy   . Varicose veins   . Vitamin D deficiency     Past Surgical History:  Procedure Laterality Date  . BREAST BIOPSY Right    benign nodule  . EYE SURGERY Left    cataract removed  . FOOT SURGERY    . VAGINAL HYSTERECTOMY     menorrhagia   Family History:  Family History  Problem Relation Age of Onset  . Diabetes Brother   . Lung cancer Brother   . Breast cancer Sister   . Ovarian cancer Neg Hx   . Colon cancer Neg Hx   . Heart disease Neg Hx    Family Psychiatric  History: No report of other psychiatric history  Social History:  Social History   Substance and Sexual Activity  Alcohol Use No     Social History   Substance and Sexual Activity  Drug Use No    Social History   Socioeconomic History  . Marital status: Married    Spouse name: Not on file  . Number of children: Not on file  . Years of education: Not on file  . Highest education level: Not on file  Occupational History  . Not on file  Tobacco Use  . Smoking status: Former Research scientist (life sciences)  . Smokeless tobacco: Never Used  Vaping Use  . Vaping Use: Never used  Substance and Sexual Activity  . Alcohol use: No  . Drug use: No  .  Sexual activity: Yes    Birth control/protection: Surgical  Other Topics Concern  . Not on file  Social History Narrative  . Not on file   Social Determinants of Health   Financial Resource Strain: Not on file  Food Insecurity: Not on file  Transportation Needs: Not on file  Physical Activity: Not on file  Stress: Not on file  Social Connections: Not on file   Additional Social History:    Allergies:   Allergies  Allergen Reactions  . Prednisone Swelling and Other (See Comments)    Insomnia   . Propoxyphene Hives  . Celecoxib Nausea And Vomiting  . Ibuprofen Nausea And Vomiting    Labs:  Results for orders placed or performed during the hospital encounter of 02/20/21 (from the past 48 hour(s))   CBC with Differential     Status: Abnormal   Collection Time: 02/20/21  3:19 PM  Result Value Ref Range   WBC 12.3 (H) 4.0 - 10.5 K/uL   RBC 3.63 (L) 3.87 - 5.11 MIL/uL   Hemoglobin 11.9 (L) 12.0 - 15.0 g/dL   HCT 38.1 36.0 - 46.0 %   MCV 105.0 (H) 80.0 - 100.0 fL   MCH 32.8 26.0 - 34.0 pg   MCHC 31.2 30.0 - 36.0 g/dL   RDW 15.4 11.5 - 15.5 %   Platelets 297 150 - 400 K/uL   nRBC 0.0 0.0 - 0.2 %   Neutrophils Relative % 87 %   Neutro Abs 10.6 (H) 1.7 - 7.7 K/uL   Lymphocytes Relative 7 %   Lymphs Abs 0.9 0.7 - 4.0 K/uL   Monocytes Relative 4 %   Monocytes Absolute 0.5 0.1 - 1.0 K/uL   Eosinophils Relative 2 %   Eosinophils Absolute 0.2 0.0 - 0.5 K/uL   Basophils Relative 0 %   Basophils Absolute 0.0 0.0 - 0.1 K/uL   Immature Granulocytes 0 %   Abs Immature Granulocytes 0.05 0.00 - 0.07 K/uL    Comment: Performed at [facility], [address] [place], [place] [address]  Comprehensive metabolic panel     Status: Abnormal   Collection Time: 02/20/21  3:19 PM  Result Value Ref Range   Sodium 137 135 - 145 mmol/L   Potassium 4.4 3.5 - 5.1 mmol/L   Chloride 109 98 - 111 mmol/L   CO2 23 22 - 32 mmol/L   Glucose, Bld 89 70 - 99 mg/dL    Comment: Glucose reference range applies only to samples taken after fasting for at least 8 hours.   BUN 49 (H) 8 - 23 mg/dL   Creatinine, Ser 1.15 (H) 0.44 - 1.00 mg/dL   Calcium 9.7 8.9 - 10.3 mg/dL   Total Protein 8.1 6.5 - 8.1 g/dL   Albumin 3.3 (L) 3.5 - 5.0 g/dL   AST 20 15 - 41 U/L   ALT 13 0 - 44 U/L   Alkaline Phosphatase 113 38 - 126 U/L   Total Bilirubin 0.9 0.3 - 1.2 mg/dL   GFR, Estimated 46 (L) >60 mL/min    Comment: (NOTE) Calculated using the CKD-EPI Creatinine Equation (2021)    Anion gap 5 5 - 15    Comment: Performed at [facility], [address] [place], [place] [address]  Procalcitonin - Baseline     Status: None   Collection Time: 02/20/21  3:19 PM  Result Value Ref Range   Procalcitonin  0.14 ng/mL    Comment:  Interpretation: PCT (Procalcitonin) <= 0.5 ng/mL: Systemic infection (sepsis) is not likely. Local bacterial infection is possible. (NOTE)       Sepsis PCT Algorithm           Lower Respiratory Tract                                      Infection PCT Algorithm    ----------------------------     ----------------------------         PCT < 0.25 ng/mL                PCT < 0.10 ng/mL          Strongly encourage             Strongly discourage   discontinuation of antibiotics    initiation of antibiotics    ----------------------------     -----------------------------       PCT 0.25 - 0.50 ng/mL            PCT 0.10 - 0.25 ng/mL               OR       >80% decrease in PCT            Discourage initiation of                                            antibiotics      Encourage discontinuation           of antibiotics    ----------------------------     -----------------------------         PCT >= 0.50 ng/mL              PCT 0.26 - 0.50 ng/mL               AND        <80% decrease in PCT             Encourage initiation of                                             antibiotics       Encourage continuation           of antibiotics    ----------------------------     -----------------------------        PCT >= 0.50 ng/mL                  PCT > 0.50 ng/mL               AND         increase in PCT                  Strongly encourage                                      initiation of antibiotics    Strongly encourage escalation           of antibiotics                                     -----------------------------  PCT <= 0.25 ng/mL                                                 OR                                        > 80% decrease in PCT                                      Discontinue / Do not initiate                                             antibiotics  Performed at [facility], [address] [place], [place] [address]   Resp Panel by RT-PCR (Flu A&B, Covid) Nasopharyngeal Swab     Status: None   Collection Time: 02/20/21  4:39 PM   Specimen: Nasopharyngeal Swab; Nasopharyngeal(NP) swabs in vial transport medium  Result Value Ref Range   SARS Coronavirus 2 by RT PCR NEGATIVE NEGATIVE    Comment: (NOTE) SARS-CoV-2 target nucleic acids are NOT DETECTED.  The SARS-CoV-2 RNA is generally detectable in upper respiratory specimens during the acute phase of infection. The lowest concentration of SARS-CoV-2 viral copies this assay can detect is 138 copies/mL. A negative result does not preclude SARS-Cov-2 infection and should not be used as the sole basis for treatment or other patient management decisions. A negative result may occur with  improper specimen collection/handling, submission of specimen other than nasopharyngeal swab, presence of viral mutation(s) within the areas targeted by this assay, and inadequate number of viral copies(<138 copies/mL). A negative result must be combined with clinical observations, patient history, and epidemiological information. The expected result is Negative.  Fact Sheet for Patients:  EntrepreneurPulse.com.au  Fact Sheet for Healthcare Providers:  IncredibleEmployment.be  This test is no t yet approved or cleared by the [place] FDA and  has been authorized for detection and/or diagnosis of SARS-CoV-2 by FDA under an Emergency Use Authorization (EUA). This EUA will remain  in effect (meaning this test can be used) for the duration of the COVID-19 declaration under Section 564(b)(1) of the Act, 21 U.S.C.section 360bbb-3(b)(1), unless the authorization is terminated  or revoked sooner.       Influenza A by PCR NEGATIVE NEGATIVE   Influenza B by PCR NEGATIVE NEGATIVE    Comment: (NOTE) The Xpert Xpress SARS-CoV-2/FLU/RSV plus assay is intended as an aid in the diagnosis of influenza from  Nasopharyngeal swab specimens and should not be used as a sole basis for treatment. Nasal washings and aspirates are unacceptable for Xpert Xpress SARS-CoV-2/FLU/RSV testing.  Fact Sheet for Patients: EntrepreneurPulse.com.au  Fact Sheet for Healthcare Providers: IncredibleEmployment.be  This test is not yet approved or cleared by the [place] FDA and has been authorized for detection and/or diagnosis of SARS-CoV-2 by FDA under an Emergency Use Authorization (EUA). This EUA will remain in effect (meaning this test can be used) for the duration of the COVID-19 declaration under Section 564(b)(1) of the Act, 21 U.S.C. section 360bbb-3(b)(1), unless  the authorization is terminated or revoked.  Performed at [facility], [address] [place], [place] [address]   Urinalysis, Complete w Microscopic     Status: Abnormal   Collection Time: 02/20/21  5:52 PM  Result Value Ref Range   Color, Urine STRAW (A) YELLOW   APPearance CLEAR (A) CLEAR   Specific Gravity, Urine 1.018 1.005 - 1.030   pH 7.0 5.0 - 8.0   Glucose, UA NEGATIVE NEGATIVE mg/dL   Hgb urine dipstick SMALL (A) NEGATIVE   Bilirubin Urine NEGATIVE NEGATIVE   Ketones, ur NEGATIVE NEGATIVE mg/dL   Protein, ur NEGATIVE NEGATIVE mg/dL   Nitrite NEGATIVE NEGATIVE   Leukocytes,Ua NEGATIVE NEGATIVE   RBC / HPF 0-5 0 - 5 RBC/hpf   WBC, UA 0-5 0 - 5 WBC/hpf   Bacteria, UA FEW (A) NONE SEEN   Squamous Epithelial / LPF NONE SEEN 0 - 5   Mucus PRESENT     Comment: Performed at [facility], [address] [place], [place] [address]  Lithium level     Status: Abnormal   Collection Time: 02/20/21  6:19 PM  Result Value Ref Range   Lithium Lvl 1.32 (H) 0.60 - 1.20 mmol/L    Comment: Performed at [facility], [address] [place], [place] [address]  TSH     Status: None   Collection Time: 02/20/21  6:19 PM  Result Value Ref Range   TSH 2.548 0.350 -  4.500 uIU/mL    Comment: Performed by a 3rd Generation assay with a functional sensitivity of <=0.01 uIU/mL. Performed at [facility], [address] [place], [place] [address]   T4, free     Status: None   Collection Time: 02/20/21  6:19 PM  Result Value Ref Range   Free T4 1.08 0.61 - 1.12 ng/dL    Comment: (NOTE) Biotin ingestion may interfere with free T4 tests. If the results are inconsistent with the TSH level, previous test results, or the clinical presentation, then consider biotin interference. If needed, order repeat testing after stopping biotin. Performed at [facility], [address], [place], [place] [address]     Current Facility-Administered Medications  Medication Dose Route Frequency Provider Last Rate Last Admin  . acetaminophen (TYLENOL) tablet 650 mg  650 mg Oral Q6H PRN Agbata, Tochukwu, MD       Or  . acetaminophen (TYLENOL) suppository 650 mg  650 mg Rectal Q6H PRN Agbata, Tochukwu, MD      . azithromycin (ZITHROMAX) 500 mg in sodium chloride 0.9 % 250 mL IVPB  500 mg Intravenous Once Lavonia Drafts, MD      . azithromycin (ZITHROMAX) 500 mg in sodium chloride 0.9 % 250 mL IVPB  500 mg Intravenous Q24H Agbata, Tochukwu, MD      . cefTRIAXone (ROCEPHIN) 1 g in sodium chloride 0.9 % 100 mL IVPB  1 g Intravenous Once Lavonia Drafts, MD      . cefTRIAXone (ROCEPHIN) 2 g in sodium chloride 0.9 % 100 mL IVPB  2 g Intravenous Q24H Agbata, Tochukwu, MD      . dextrose 5 %-0.45 % sodium chloride infusion   Intravenous Continuous Agbata, Tochukwu, MD      . enoxaparin (LOVENOX) injection 40 mg  40 mg Subcutaneous Q24H Agbata, Tochukwu, MD      . latanoprost (XALATAN) 0.005 % ophthalmic solution 1 drop  1 drop Both Eyes QHS Agbata, Tochukwu, MD      . Multivitamin Adult TABS  Oral Daily Agbata, Tochukwu, MD      . ondansetron (ZOFRAN) tablet 4 mg  4 mg Oral Q6H PRN Agbata, Tochukwu, MD       Or  . ondansetron (ZOFRAN) injection 4 mg  4 mg  Intravenous Q6H PRN Agbata, Tochukwu, MD      . polyethylene glycol (MIRALAX / GLYCOLAX) packet 17 g  17 g Oral Daily Vanessa Nampa, MD      . QUEtiapine (SEROQUEL) tablet 50 mg  50 mg Oral QHS Agbata, Tochukwu, MD       Current Outpatient Medications  Medication Sig Dispense Refill  . lithium carbonate (ESKALITH) 450 MG CR tablet Take 450 mg by mouth at bedtime.    Marland Kitchen QUEtiapine (SEROQUEL) 25 MG tablet Take 50 mg by mouth at bedtime.    . bimatoprost (LUMIGAN) 0.01 % SOLN Place 1 drop into both eyes at bedtime.     . cephALEXin (KEFLEX) 250 MG capsule Take 1 capsule (250 mg total) by mouth 4 (four) times daily. (Patient not taking: No sig reported) 28 capsule 0  . lithium 300 MG tablet Take 1 tablet (300 mg total) by mouth at bedtime. (Patient taking differently: Take 300 mg by mouth daily.) 30 tablet 2  . magnesium oxide (MAG-OX) 400 MG tablet Take 400 mg by mouth daily. (Patient not taking: Reported on 02/20/2021)    . Multiple Vitamins-Minerals (MULTIVITAMIN ADULT PO) Take 1 tablet by mouth daily.     Marland Kitchen Respiratory Therapy Supplies (FLUTTER) DEVI Use 10-15 times daily 1 each 0  . timolol (TIMOPTIC) 0.25 % ophthalmic solution Place 1 drop into both eyes 2 (two) times daily.  (Patient not taking: Reported on 02/20/2021)      Musculoskeletal: Strength & Muscle Tone: decreased Gait & Station: unable to stand Patient leans: N/A  Psychiatric Specialty Exam: Physical Exam Vitals and nursing note reviewed.  Constitutional:      Appearance: She is ill-appearing.  HENT:     Head: Normocephalic and atraumatic.     Right Ear: External ear normal.     Left Ear: External ear normal.     Nose: Nose normal.     Mouth/Throat:     Mouth: Mucous membranes are dry.  Eyes:     Extraocular Movements: Extraocular movements intact.     Conjunctiva/sclera: Conjunctivae normal.     Pupils: Pupils are equal, round, and reactive to light.  Cardiovascular:     Rate and Rhythm: Normal rate.     Pulses:  Normal pulses.  Pulmonary:     Effort: Pulmonary effort is normal.     Breath sounds: Normal breath sounds.  Abdominal:     General: Abdomen is flat.     Palpations: Abdomen is soft.  Musculoskeletal:        General: No swelling or deformity.     Cervical back: Normal range of motion and neck supple.  Skin:    General: Skin is warm and dry.  Neurological:     Mental Status: She is disoriented.     Motor: Weakness present.  Psychiatric:        Attention and Perception: She is inattentive.     Review of Systems  Unable to perform ROS: Mental status change    Blood pressure 138/74, pulse (!) 106, temperature 97.7 F (36.5 C), temperature source Oral, resp. rate (!) 28, height 4\' 11"  (1.499 m), weight 39.9 kg, SpO2 97 %.Body mass index is 17.77 kg/m.  General Appearance: Disheveled  Eye Contact:  None  Speech:  Garbled  Volume:  Decreased  Mood:  Negative  Affect:  Negative  Thought Process:  NA  Orientation:  Negative  Thought Content:  Negative  Suicidal Thoughts:  Unknown  Homicidal Thoughts:  Unkown  Memory:  Negative  Judgement:  Negative  Insight:  Negative  Psychomotor Activity:  Tremor  Concentration:  Concentration: Negative  Recall:  Negative  Fund of Knowledge:  Negative  Language:  Negative  Akathisia:  Negative  Handed:  Right  AIMS (if indicated):     Assets:  Housing Social Support  ADL's:  Impaired  Cognition:  Impaired,  Severe  Sleep:         Treatment Plan Summary: Daily contact with patient to assess and evaluate symptoms and progress in treatment and Medication management. Patient is an 87 year old female with history of bipolar disorder presenting with delirium. Lithium level noted to be elevated at 1.32. Recommend holding lithium at this time. Suggest Seroquel 25 mg Q8hr prn for acute agitation. If unable to tolerate PO, recommend low dose Haldol 1 mg q8hr PRN IM or IV. If using IV Haldol, recommend serial EKG to monitor for QTc  prolongation. Also suggest melatonin 3 mg QHS to assist with maintaining sleep, wake cycle. Also recommend maintaining sleep-wake cycle as much as possible with lights on, windows open during the day, out of bed to chair as feasible. Consult team will continue to follow while on medical floor.   Disposition: Supportive therapy provided about ongoing stressors.  Salley Scarlet, MD 02/21/2021 2:58 PM

## 2021-02-21 NOTE — ED Notes (Signed)
This tech and Mariea Clonts, Therapist, sports performed peri care. Dry brief placed. This tech remands 1:1 Air cabin crew. Pt placed on a hospital bed. Pt looks comfortable. Pt has not been trying to get up from bed.  x2 rails up.

## 2021-02-21 NOTE — ED Notes (Signed)
This tech performed peri care. Dry brief placed. This tech remands 1:1 Air cabin crew. Pt has not been trying to get up from bed. x2 rails up.

## 2021-02-21 NOTE — ED Notes (Signed)
Pt brief changed at this time. Pt repositioned and given warm blanket

## 2021-02-21 NOTE — Progress Notes (Signed)
PHARMACIST - PHYSICIAN COMMUNICATION  CONCERNING:  Enoxaparin (Lovenox) for DVT Prophylaxis    RECOMMENDATION: Patient was prescribed enoxaprin 40mg  q24 hours for VTE prophylaxis.   Filed Weights   02/20/21 1504  Weight: 39.9 kg (88 lb)    Body mass index is 17.77 kg/m.  Estimated Creatinine Clearance: 20.9 mL/min (A) (by C-G formula based on SCr of 1.15 mg/dL (H)).  Patient is candidate for enoxaparin 30mg  every 24 hours based on CrCl <49ml/min or Weight <45kg  DESCRIPTION: Pharmacy has adjusted enoxaparin dose per [facility] policy.  Patient is now receiving enoxaparin 30 mg every 24 hours    Berta Minor, PharmD Clinical Pharmacist  02/21/2021 3:16 PM

## 2021-02-21 NOTE — ED Notes (Signed)
Patient is resting comfortably. 

## 2021-02-21 NOTE — Evaluation (Signed)
Physical Therapy Evaluation Patient Details Name: Sierra Benson MRN: [id number] DOB: 12-17-1929 Today's Date: 02/21/2021   History of Present Illness  Pt is an 87 y.o. female presenting to hospital 3/4 with weakness, not sleeping for multiple nights, not eating/drinking, multiple falls, pain all over; family unable to take care of pt at home in current condition.  Per chart this is pt's 3rd visit for similar concerns and pt has been progressively declining.  PMH includes insomnia, mitral valve disorder, h/o bipolar 1 disorder, hemorrhage of anus and rectum, h/o palpitations, h/o eye sx, h/o foot sx.  Clinical Impression  Prior to hospital admission, pt appears to have been ambulatory.  Pt did not verbalize entire session and no family present to provide any information.  Pt noted to be very shaky in bed (sitter present and reports pt has been this way;  nurse also aware; MD also arrived during session and aware).  Pt attempting to get OOB.  Currently pt is 1-2 assist with bed mobility; CGA for sitting balance; and mod assist x2 (B hand hold assist) to stand x3 trials (with standing pt only able to come to 1/2 stand and each attempt pt lifting L foot off floor).  Pt repositioned for comfort in bed end of session with sitter present.  Pt would benefit from skilled PT to address noted impairments and functional limitations (see below for any additional details).  Upon hospital discharge, anticipate pt may benefit from STR.    Follow Up Recommendations SNF    Equipment Recommendations  Rolling walker with 5" wheels;3in1 (PT);Wheelchair (measurements PT);Wheelchair cushion (measurements PT)    Recommendations for Other Services OT consult     Precautions / Restrictions Precautions Precautions: Fall Restrictions Weight Bearing Restrictions: No      Mobility  Bed Mobility Overal bed mobility: Needs Assistance Bed Mobility: Supine to Sit;Sit to Supine     Supine to sit: Mod assist;Max  assist;HOB elevated (assist for trunk > LE's) Sit to supine: +2 for physical assistance;+2 for safety/equipment   General bed mobility comments: vc's for technique; pt very shaky; assist to scoot forward towards edge of bed    Transfers Overall transfer level: Needs assistance Equipment used: 2 person hand held assist Transfers: Sit to/from Stand Sit to Stand: Mod assist;+2 physical assistance         General transfer comment: vc's and tactile cues for LE placement; with standing pt lifting L foot off floor and only 1/2 standing with R LE on floor each attempt; pt very shaky  Ambulation/Gait             General Gait Details: unable to stand upright with both feet on floor to attempt walking; pt also very shaky  Stairs            Wheelchair Mobility    Modified Rankin (Stroke Patients Only)       Balance Overall balance assessment: Needs assistance Sitting-balance support: Bilateral upper extremity supported;Feet supported Sitting balance-Leahy Scale: Fair Sitting balance - Comments: pt very shaky sitting edge of bed requiring CGA for safety   Standing balance support: Bilateral upper extremity supported Standing balance-Leahy Scale: Poor Standing balance comment: pt only able to come to 1/2 stand with B hand hold assist                             Pertinent Vitals/Pain Pain Assessment: Faces Faces Pain Scale: Hurts a little bit Pain Location: general discomfort  Pain Descriptors / Indicators: Restless Pain Intervention(s): Limited activity within patient's tolerance;Monitored during session;Repositioned  Vitals (HR and O2 on room air) stable and WFL throughout treatment session.    Home Living Family/patient expects to be discharged to:: Unsure                 Additional Comments: Pt did not verbalize entire attempted session and no family present to provide information.  Per chart pt from home.    Prior Function            Comments: Pt did not verbalize entire attempted session and no family present to provide information.  Per chart pt from home.     Hand Dominance        Extremity/Trunk Assessment   Upper Extremity Assessment Upper Extremity Assessment: Generalized weakness;Difficult to assess due to impaired cognition    Lower Extremity Assessment Lower Extremity Assessment: Generalized weakness;Difficult to assess due to impaired cognition    Cervical / Trunk Assessment Cervical / Trunk Assessment: Other exceptions (forward head/shoulders)  Communication   Communication: Other (comment) (Pt did not verbalize entire attempted session.)  Cognition Arousal/Alertness: Awake/alert Behavior During Therapy: Flat affect Overall Cognitive Status: No family/caregiver present to determine baseline cognitive functioning                                 General Comments: Pt did not verbalize entire attempted session.  Inconsistent with following 1 step cues.      General Comments General comments (skin integrity, edema, etc.): L>R hand redness (MD present beginning of session and aware).  Nursing cleared pt for participation in physical therapy.  Sitter present assisting pt during session.    Exercises     Assessment/Plan    PT Assessment Patient needs continued PT services  PT Problem List Decreased strength;Decreased activity tolerance;Decreased balance;Decreased mobility;Decreased coordination;Decreased cognition;Decreased knowledge of use of DME;Decreased safety awareness;Decreased knowledge of precautions       PT Treatment Interventions DME instruction;Gait training;Functional mobility training;Therapeutic activities;Therapeutic exercise;Balance training;Patient/family education;Cognitive remediation    PT Goals (Current goals can be found in the Care Plan section)  Acute Rehab PT Goals PT Goal Formulation: Patient unable to participate in goal setting Time For Goal  Achievement: 03/07/21 Potential to Achieve Goals: Fair    Frequency Min 2X/week   Barriers to discharge Decreased caregiver support      Co-evaluation               AM-PAC PT "6 Clicks" Mobility  Outcome Measure Help needed turning from your back to your side while in a flat bed without using bedrails?: A Little Help needed moving from lying on your back to sitting on the side of a flat bed without using bedrails?: A Lot Help needed moving to and from a bed to a chair (including a wheelchair)?: Total Help needed standing up from a chair using your arms (e.g., wheelchair or bedside chair)?: Total Help needed to walk in hospital room?: Total Help needed climbing 3-5 steps with a railing? : Total 6 Click Score: 9    End of Session Equipment Utilized During Treatment: Gait belt Activity Tolerance: Other (comment) (No change in pt's status noted) Patient left: in bed;with call bell/phone within reach;with nursing/sitter in room;Other (comment) Freight forwarder present with pt.) Nurse Communication: Mobility status;Precautions;Other (comment) (Pt's shakiness (MD also present and aware)) PT Visit Diagnosis: Other abnormalities of gait and mobility (R26.89);Unsteadiness on feet (R26.81);Muscle weakness (  generalized) (M62.81);History of falling (Z91.81);Difficulty in walking, not elsewhere classified (R26.2);Other symptoms and signs involving the nervous system (Q22.297)    Time: 9892-1194 PT Time Calculation (min) (ACUTE ONLY): 12 min   Charges:   PT Evaluation $PT Eval Low Complexity: 1 Low         Emily Hauschild, PT 02/21/21, 3:06 PM

## 2021-02-21 NOTE — ED Notes (Signed)
Bed alarm placed on pt.

## 2021-02-21 NOTE — ED Notes (Signed)
Pt sleeping. 

## 2021-02-21 NOTE — BH Assessment (Signed)
Unable to complete TTS consult at this time. Patient is unable to participate in the interview. ER MD (Dr. Corky Downs) updated.

## 2021-02-21 NOTE — Consult Note (Signed)
Patient only partially awakens to voice and physical stimulation to leg and arm. She groans incoherently, and rolls over. However, she is unable to answer any questions. Notable tremor on exam. She is unable to answer yes or no about whether she is in pain or feeling cold. Lithium level noted to be elevated, and recommend continuing to hold medication. ED physician notified of above.   Selina Cooley, MD

## 2021-02-21 NOTE — ED Notes (Signed)
Physical therapy at bedside

## 2021-02-21 NOTE — H&P (Addendum)
History and Physical    KRISY DIX JSH:[id number] DOB: [birth year] DOA: 02/20/2021  PCP: Idelle Crouch, MD   Patient coming from: Home  I have personally briefly reviewed patient's old medical records in [place]  Chief Complaint: Change in mental status Most of the history was obtained from patient's husband over the phone as patient is unable to provide any history  HPI: Sierra Benson is a 87 y.o. female with medical history significant for bipolar disorder who was brought into the emergency room by EMS for evaluation of mental status changes.  Per husband patient has not slept in about 48 hours and has been wandering around in the house, very irritable and agitated.  He states that she has become increasingly confused and difficult to reorient.  Her oral intake has been very poor, she is very weak and has had multiple falls at home.  Family is concerned that she has lost over 10 pounds in the last 1 month due to her poor oral intake.  Patient was previously on mirtazapine with good results but she stopped taking it about 2 months ago.  They state that she has restarted taking the mirtazapine but has not had the same results she had in the past. Review of records shows that patient has had multiple ED visits for same and was recently started on Seroquel by her primary care provider which the family thinks has made her condition worse. I am unable to do a review of systems on this patient due to her mental status changes. Labs show sodium 137, potassium 4.4, chloride 109, bicarb 23, glucose 89, BUN 49, creatinine 1.15, calcium 9.7, alkaline phosphatase 113, albumin 3.3, AST 20, ALT 13, total protein 8.1, total CK 8, procalcitonin 0.14, white count 12.3, hemoglobin 11.9, hematocrit 38.1, MCV 105, RDW 15.4, platelet count 297, lithium 1.32, TSH 2.5, T4 1.08 CT scan of abdomen and pelvis shows moderate large volume of stool throughout the colon with stool distending the rectum,  suggesting fecal impaction. Mild stranding of the perirectal fat. No obstruction or perforation. Trace free fluid in the pelvis is likely reactive. Mild contrast refluxing into the hepatic veins and IVC, can be seen with right heart failure. CT angiogram of the chest shows no pulmonary embolus. Progressive multifocal bronchiectasis with multifocal nodular, tree-in-bud, and consolidative airspace opacities throughout both lungs. Definite progression in parenchymal findings from 2018. Findings are most consistent with progression of chronic indolent  infection, Mycobacterium avium complex. Cardiomegaly with primarily right heart dilatation. Mild reflux of contrast into the IVC consistent with elevated right heart pressures. Mild mediastinal and right hilar adenopathy is likely reactive. Trace left pleural effusion. Cervical spine CT shows no acute cervical spine fracture. Multilevel cervical spondylosis unchanged. Patchy bilateral upper lobe airspace disease. CT scan of the head without contrast shows no acute intracranial process. Minimal right maxillary sinus disease. Twelve-lead EKG shows multiple artifacts    ED Course: Patient is an 87 year old Caucasian female who was brought into the ER by family members for evaluation of mental status changes which have been progressive over the last couple of weeks.  Her husband states that she has been awake for 2 days straight and has been wandering around the house, very irritable and agitated.  Oral intake is very poor and patient has lost about 10 pounds in the last 1 month.  She had a CT scan of the chest with contrast to rule out a PE which shows progression and parenchymal findings from 2018.  Findings are most consistent with progression of chronic indolent infection with MAI.  She is also noted to have right heart dilatation on CT scan. Patient will be admitted to the hospital for delirium and has been seen by psychiatry.    Review of Systems: As  per HPI otherwise all other systems reviewed and negative.    Past Medical History:  Diagnosis Date  . Bacterial pneumonia 11/2015  . Cystocele   . Glaucoma   . Insomnia   . Menopausal state   . Mitral valve disorder   . Nocturia   . Prediabetes 06/19/2015  . Rectocele   . Vaginal atrophy   . Varicose veins   . Vitamin D deficiency     Past Surgical History:  Procedure Laterality Date  . BREAST BIOPSY Right    benign nodule  . EYE SURGERY Left    cataract removed  . FOOT SURGERY    . VAGINAL HYSTERECTOMY     menorrhagia     reports that she has quit smoking. She has never used smokeless tobacco. She reports that she does not drink alcohol and does not use drugs.  Allergies  Allergen Reactions  . Prednisone Swelling and Other (See Comments)    Insomnia   . Propoxyphene Hives  . Celecoxib Nausea And Vomiting  . Ibuprofen Nausea And Vomiting    Family History  Problem Relation Age of Onset  . Diabetes Brother   . Lung cancer Brother   . Breast cancer Sister   . Ovarian cancer Neg Hx   . Colon cancer Neg Hx   . Heart disease Neg Hx       Prior to Admission medications   Medication Sig Start Date End Date Taking? Authorizing Provider  lithium carbonate (ESKALITH) 450 MG CR tablet Take 450 mg by mouth at bedtime.   Yes [provider]  QUEtiapine (SEROQUEL) 25 MG tablet Take 50 mg by mouth at bedtime.   Yes [provider]  bimatoprost (LUMIGAN) 0.01 % SOLN Place 1 drop into both eyes at bedtime.     [provider]  cephALEXin (KEFLEX) 250 MG capsule Take 1 capsule (250 mg total) by mouth 4 (four) times daily. Patient not taking: No sig reported 06/23/18   Hayden Rasmussen, MD  lithium 300 MG tablet Take 1 tablet (300 mg total) by mouth at bedtime. Patient taking differently: Take 300 mg by mouth daily. 12/10/15 06/23/18  Earleen Newport, MD  magnesium oxide (MAG-OX) 400 MG tablet Take 400 mg by mouth daily. Patient not taking:  Reported on 02/20/2021    [provider]  Multiple Vitamins-Minerals (MULTIVITAMIN ADULT PO) Take 1 tablet by mouth daily.     [provider]  Respiratory Therapy Supplies (FLUTTER) DEVI Use 10-15 times daily 10/20/17   Wilhelmina Mcardle, MD  timolol (TIMOPTIC) 0.25 % ophthalmic solution Place 1 drop into both eyes 2 (two) times daily.  Patient not taking: Reported on 02/20/2021    [provider]    Physical Exam: Vitals:   02/21/21 1315 02/21/21 1330 02/21/21 1400 02/21/21 1450  BP:  (!) 130/57 (!) 143/86 138/74  Pulse:  90 (!) 102 (!) 106  Resp:   (!) 28   Temp:      TempSrc:      SpO2: 95%  93% 97%  Weight:      Height:         Vitals:   02/21/21 1315 02/21/21 1330 02/21/21 1400 02/21/21 1450  BP:  Marland Kitchen)  130/57 (!) 143/86 138/74  Pulse:  90 (!) 102 (!) 106  Resp:   (!) 28   Temp:      TempSrc:      SpO2: 95%  93% 97%  Weight:      Height:          Constitutional: Alert and oriented only to person.  Patient with generalized tremors, agitated and unable to follow commands. Thin and frail HEENT:      Head: Normocephalic and atraumatic.         Eyes: PERLA, EOMI, Conjunctivae are normal. Sclera is non-icteric.       Mouth/Throat: Mucous membranes are dry.       Neck: Supple with no signs of meningismus. Cardiovascular: Regular rate and rhythm. No murmurs, gallops, or rubs. 2+ symmetrical distal pulses are present . No JVD. No  LE edema Respiratory: Bilateral air entry. No wheezes or rhonchi.  Gastrointestinal: Soft, non tender, and non distended with positive bowel sounds.  Genitourinary: No CVA tenderness. Musculoskeletal: Nontender with normal range of motion in all extremities. No cyanosis, or erythema of extremities. Neurologic:  Face is symmetric. Moving all extremities. No gross focal neurologic deficits . Skin: Skin is warm, dry.  No rash or ulcers Psychiatric: Mood and affect are normal    Labs on Admission: I have personally reviewed  following labs and imaging studies  CBC: Recent Labs  Lab 02/20/21 1519  WBC 12.3*  NEUTROABS 10.6*  HGB 11.9*  HCT 38.1  MCV 105.0*  PLT 629   Basic Metabolic Panel: Recent Labs  Lab 02/20/21 1519  NA 137  K 4.4  CL 109  CO2 23  GLUCOSE 89  BUN 49*  CREATININE 1.15*  CALCIUM 9.7   GFR: Estimated Creatinine Clearance: 20.9 mL/min (A) (by C-G formula based on SCr of 1.15 mg/dL (H)). Liver Function Tests: Recent Labs  Lab 02/20/21 1519  AST 20  ALT 13  ALKPHOS 113  BILITOT 0.9  PROT 8.1  ALBUMIN 3.3*   No results for input(s): LIPASE, AMYLASE in the last 168 hours. No results for input(s): AMMONIA in the last 168 hours. Coagulation Profile: No results for input(s): INR, PROTIME in the last 168 hours. Cardiac Enzymes: No results for input(s): CKTOTAL, CKMB, CKMBINDEX, TROPONINI in the last 168 hours. BNP (last 3 results) No results for input(s): PROBNP in the last 8760 hours. HbA1C: No results for input(s): HGBA1C in the last 72 hours. CBG: No results for input(s): GLUCAP in the last 168 hours. Lipid Profile: No results for input(s): CHOL, HDL, LDLCALC, TRIG, CHOLHDL, LDLDIRECT in the last 72 hours. Thyroid Function Tests: Recent Labs    02/20/21 1819  TSH 2.548  FREET4 1.08   Anemia Panel: No results for input(s): VITAMINB12, FOLATE, FERRITIN, TIBC, IRON, RETICCTPCT in the last 72 hours. Urine analysis:    Component Value Date/Time   COLORURINE STRAW (A) 02/20/2021 1752   APPEARANCEUR CLEAR (A) 02/20/2021 1752   LABSPEC 1.018 02/20/2021 1752   PHURINE 7.0 02/20/2021 1752   GLUCOSEU NEGATIVE 02/20/2021 1752   HGBUR SMALL (A) 02/20/2021 1752   BILIRUBINUR NEGATIVE 02/20/2021 1752   BILIRUBINUR neg 06/15/2018 1121   KETONESUR NEGATIVE 02/20/2021 1752   PROTEINUR NEGATIVE 02/20/2021 1752   UROBILINOGEN 0.2 06/15/2018 1121   NITRITE NEGATIVE 02/20/2021 1752   LEUKOCYTESUR NEGATIVE 02/20/2021 1752    Radiological Exams on Admission: CT Head Wo  Contrast  Result Date: 02/20/2021 CLINICAL DATA:  Head trauma EXAM: CT HEAD WITHOUT CONTRAST TECHNIQUE: Contiguous axial  images were obtained from the base of the skull through the vertex without intravenous contrast. COMPARISON:  01/12/2021 FINDINGS: Brain: No acute infarct or hemorrhage. Lateral ventricles and midline structures are stable. No acute extra-axial fluid collections. No mass effect. Vascular: No hyperdense vessel or unexpected calcification. Skull: Normal. Negative for fracture or focal lesion. Sinuses/Orbits: Minimal fluid within the right maxillary sinus. Remaining paranasal sinuses are clear. Postsurgical changes left orbit. Other: None. IMPRESSION: 1. No acute intracranial process. 2. Minimal right maxillary sinus disease. Electronically Signed   By: Randa Ngo M.D.   On: 02/20/2021 19:04   CT Angio Chest PE W and/or Wo Contrast  Result Date: 02/20/2021 CLINICAL DATA:  PE suspected, high prob No additional relevant history provided or available. EXAM: CT ANGIOGRAPHY CHEST WITH CONTRAST TECHNIQUE: Multidetector CT imaging of the chest was performed using the standard protocol during bolus administration of intravenous contrast. Multiplanar CT image reconstructions and MIPs were obtained to evaluate the vascular anatomy. CONTRAST:  24m OMNIPAQUE IOHEXOL 350 MG/ML SOLN COMPARISON:  Most recent chest radiograph 01/12/2021. Chest CT 11/03/2017 FINDINGS: Cardiovascular: There are no filling defects within the pulmonary arteries to suggest pulmonary embolus. Mild aortic atherosclerosis and tortuosity. Cardiomegaly with primarily right heart dilatation. Mild reflux of contrast into the IVC. No pericardial effusion. Mediastinum/Nodes: 12 mm right hilar node, comparison to priors difficult due to differences in technique. No left hilar adenopathy. 13 mm prominent subcarinal node. No thyroid nodule. Patulous esophagus. Lungs/Pleura: Multifocal bronchiectasis that is progressed from prior exam. Areas  of bronchial filling involving the right lower lobe. There are multifocal nodular, tree-in-bud, and consolidative airspace opacities throughout both lungs. Many of these consolidative areas are calcified or high-density. Overall interval progression from 2018 CT. Similar trace left pleural effusion. No findings of pulmonary edema. Upper Abdomen: Assessed on concurrent abdominal CT, reported separately. Musculoskeletal: Scoliosis and exaggerated thoracic kyphosis. Multilevel degenerative change in the spine. There are no acute or suspicious osseous abnormalities. Review of the MIP images confirms the above findings. IMPRESSION: 1. No pulmonary embolus. 2. Progressive multifocal bronchiectasis with multifocal nodular, tree-in-bud, and consolidative airspace opacities throughout both lungs. Definite progression in parenchymal findings from 2018. Findings are most consistent with progression of chronic indolent infection infection, Mycobacterium avium complex. 3. Cardiomegaly with primarily right heart dilatation. Mild reflux of contrast into the IVC consistent with elevated right heart pressures. 4. Mild mediastinal and right hilar adenopathy is likely reactive. 5. Trace left pleural effusion. Aortic Atherosclerosis (ICD10-I70.0). Electronically Signed   By: MKeith RakeM.D.   On: 02/20/2021 19:14   CT Cervical Spine Wo Contrast  Result Date: 02/20/2021 CLINICAL DATA:  Neck trauma EXAM: CT CERVICAL SPINE WITHOUT CONTRAST TECHNIQUE: Multidetector CT imaging of the cervical spine was performed without intravenous contrast. Multiplanar CT image reconstructions were also generated. COMPARISON:  03/03/2011 FINDINGS: Alignment: Minimal anterolisthesis of C4 relative to C5, stable. Otherwise alignment is anatomic. Skull base and vertebrae: No acute fracture. No primary bone lesion or focal pathologic process. Soft tissues and spinal canal: No prevertebral fluid or swelling. No visible canal hematoma. Disc levels:  There is mild diffuse cervical spondylosis greatest at C5-6. Prominent facet hypertrophic changes greatest at C3-4 and C4-5. Upper chest: Airway is patent. Patchy areas of airspace disease are seen at the apices. No effusion or pneumothorax. Other: Reconstructed images demonstrate no additional findings. IMPRESSION: 1. No acute cervical spine fracture. 2. Multilevel cervical spondylosis unchanged. 3. Patchy bilateral upper lobe airspace disease. Electronically Signed   By: MDiana EvesD.  On: 02/20/2021 19:08   CT ABDOMEN PELVIS W CONTRAST  Result Date: 02/20/2021 CLINICAL DATA:  Abdominal distension. EXAM: CT ABDOMEN AND PELVIS WITH CONTRAST TECHNIQUE: Multidetector CT imaging of the abdomen and pelvis was performed using the standard protocol following bolus administration of intravenous contrast. CONTRAST:  65m OMNIPAQUE IOHEXOL 350 MG/ML SOLN COMPARISON:  No prior abdominal imaging. Chest CT performed concurrently, reported separately. FINDINGS: Lower chest: Assessed on concurrent chest CT, reported separately. Hepatobiliary: Mild contrast refluxing into the hepatic veins and IVC. No focal hepatic lesion. Unremarkable gallbladder. No calcified gallstone or findings of cholecystitis. Pancreas: Parenchymal atrophy. Pancreas is partially obscured by motion. No evidence of pancreatic inflammation, ductal dilatation or mass. Spleen: Normal in size without focal abnormality. Adrenals/Urinary Tract: No adrenal nodule. No hydronephrosis or perinephric edema. Small left renal cyst. Symmetric excretion on delayed phase imaging. Urinary bladder is unremarkable. Stomach/Bowel: Bowel evaluation is limited in the absence of enteric contrast, paucity of intra-abdominal fat, and patient motion. Stomach is grossly unremarkable. There is no small bowel dilatation or evidence of obstruction. Moderate large volume of stool throughout the colon. There is stool distending the rectum. Mild stranding of the perirectal fat.  Appendix not confidently visualized, no evidence of appendicitis. Vascular/Lymphatic: Aortic atherosclerosis and tortuosity. Patent portal vein. No enlarged abdominopelvic lymph nodes. Reproductive: Hysterectomy.  No evidence of adnexal mass. Other: Trace free fluid in the pelvis. No free air or focal abscess. No abdominal wall hernia. Musculoskeletal: Scoliosis and degenerative change in the spine. There are no acute or suspicious osseous abnormalities. IMPRESSION: 1. Moderate large volume of stool throughout the colon with stool distending the rectum, suggesting fecal impaction. Mild stranding of the perirectal fat. No obstruction or perforation. 2. Trace free fluid in the pelvis is likely reactive. 3. Mild contrast refluxing into the hepatic veins and IVC, can be seen with right heart failure. Aortic Atherosclerosis (ICD10-I70.0). Electronically Signed   By: MKeith RakeM.D.   On: 02/20/2021 19:17     Assessment/Plan Principal Problem:   Delirium Active Problems:   Underweight due to inadequate caloric intake   Bipolar 1 disorder, depressed, moderate (HCC)   Dehydration   CAP (community acquired pneumonia)   Bronchiectasis ([place]     Delirium Etiology unclear Continue supportive care Haloperidol as needed for agitation Safety sitter since patient is at high risk for falls due to her agitation and mental status changes We will consult psychiatry    Bipolar disorder Patient on lithium which is currently on hold due to elevated lithium level    Dehydration Secondary to poor oral intake At baseline patient has a serum creatinine of 0.86 but today on admission it is 1.15 with elevated BUN levels of 49 IV fluid hydration Repeat renal parameters in a.m.    Underweight due to inadequate caloric intake (BMI 17.7 kg/m2) We will start patient on nutritional supplements once her mental status improves and she is able to take p.o. 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 Definite progression in parenchymal findings from 2018. Findings are most consistent with progression of chronic indolent infection infection, Mycobacterium avium complex. We will place patient empirically on Rocephin and Zithromax for superimposed bacterial infection We will request pulmonology consult    DVT prophylaxis: Lovenox Code Status: full code Family Communication: Called and spoke to patient's husband RChanelle Hodsdon  All questions and concerns have been addressed.  CODE STATUS was discussed and she is a full code.  He verbalizes understanding and agrees with the plan of care. Disposition Plan: Back to previous home environment Consults called: Psychiatry/pulmonology Status: Inpatient.  At the time of admission, it appears this is the appropriate admission status for this patient.  This is judged to be reasonable and necessary in order to provide the required intensity of service to ensure the patient's safety given the presenting symptoms, physical exam findings, and initial radiographic and lab data in the context of their comorbid conditions. Patient requires inpatient status due to high intensity of service, high risk for further deterioration and high frequency of surveillance required. I certify that at the point of admission, it is my clinical judgment that the patient will require inpatient hospital care spanning beyond 2 midnights    Rimas Gilham MD [facility]     02/21/2021, 3:22 PM

## 2021-02-21 NOTE — ED Notes (Signed)
Pt awake attempting to get out of bed. This writer able to keep pt in bed.

## 2021-02-22 DIAGNOSIS — R636 Underweight: Secondary | ICD-10-CM

## 2021-02-22 DIAGNOSIS — E860 Dehydration: Secondary | ICD-10-CM

## 2021-02-22 DIAGNOSIS — F0391 Unspecified dementia with behavioral disturbance: Secondary | ICD-10-CM | POA: Diagnosis not present

## 2021-02-22 DIAGNOSIS — N1831 Chronic kidney disease, stage 3a: Secondary | ICD-10-CM

## 2021-02-22 DIAGNOSIS — R7989 Other specified abnormal findings of blood chemistry: Secondary | ICD-10-CM | POA: Diagnosis not present

## 2021-02-22 DIAGNOSIS — J189 Pneumonia, unspecified organism: Secondary | ICD-10-CM | POA: Diagnosis not present

## 2021-02-22 DIAGNOSIS — F03918 Unspecified dementia, unspecified severity, with other behavioral disturbance: Secondary | ICD-10-CM

## 2021-02-22 DIAGNOSIS — D539 Nutritional anemia, unspecified: Secondary | ICD-10-CM

## 2021-02-22 DIAGNOSIS — R410 Disorientation, unspecified: Secondary | ICD-10-CM | POA: Diagnosis not present

## 2021-02-22 LAB — CBC
HCT: 34.6 % — ABNORMAL LOW (ref 36.0–46.0)
Hemoglobin: 11 g/dL — ABNORMAL LOW (ref 12.0–15.0)
MCH: 33.5 pg (ref 26.0–34.0)
MCHC: 31.8 g/dL (ref 30.0–36.0)
MCV: 105.5 fL — ABNORMAL HIGH (ref 80.0–100.0)
Platelets: 281 10*3/uL (ref 150–400)
RBC: 3.28 MIL/uL — ABNORMAL LOW (ref 3.87–5.11)
RDW: 15.6 % — ABNORMAL HIGH (ref 11.5–15.5)
WBC: 12.3 10*3/uL — ABNORMAL HIGH (ref 4.0–10.5)
nRBC: 0 % (ref 0.0–0.2)

## 2021-02-22 LAB — BASIC METABOLIC PANEL
Anion gap: 7 (ref 5–15)
BUN: 26 mg/dL — ABNORMAL HIGH (ref 8–23)
CO2: 22 mmol/L (ref 22–32)
Calcium: 9.3 mg/dL (ref 8.9–10.3)
Chloride: 115 mmol/L — ABNORMAL HIGH (ref 98–111)
Creatinine, Ser: 1.1 mg/dL — ABNORMAL HIGH (ref 0.44–1.00)
GFR, Estimated: 48 mL/min — ABNORMAL LOW (ref >60)
Glucose, Bld: 108 mg/dL — ABNORMAL HIGH (ref 70–99)
Potassium: 4.1 mmol/L (ref 3.5–5.1)
Sodium: 144 mmol/L (ref 135–145)

## 2021-02-22 LAB — PROCALCITONIN: Procalcitonin: <0.1 ng/mL

## 2021-02-22 MED ORDER — QUETIAPINE FUMARATE 25 MG PO TABS: 25.0000 mg | ORAL_TABLET | Freq: Every evening | ORAL | Status: DC | PRN

## 2021-02-22 MED ORDER — BISACODYL 10 MG RE SUPP
10.0000 mg | Freq: Once | RECTAL | Status: AC
  Administered 2021-02-22: 10 mg via RECTAL
  Filled 2021-02-22: qty 1

## 2021-02-22 MED ORDER — TIMOLOL MALEATE 0.25 % OP SOLN
1.0000 [drp] | Freq: Two times a day (BID) | OPHTHALMIC | Status: DC
  Administered 2021-02-22 – 2021-02-28 (×10): 1 [drp] via OPHTHALMIC
  Filled 2021-02-22 (×2): qty 5

## 2021-02-22 NOTE — Progress Notes (Signed)
Patient ID: Sierra Benson, female   DOB: 06/25/1929, 87 y.o.   MRN: [id number] [facility] PROGRESS NOTE  Sierra Benson:[id number] DOB: September 12, 1929 DOA: 02/20/2021 PCP: Idelle Crouch, MD  HPI/Subjective: Patient resisted me opening up her eyes.  Unable to get any history from her.  As per the patient's husband, I think that she has been having a reaction to the Seroquel.  Prior to Dr. Doy Hutching prescribing Seroquel she flipped her days and nights and was walking around all night.  The patient did move her arms on her own today when I was trying to open up her eyes.  Patient unable to talk with me.  Objective: Vitals:   02/22/21 0758 02/22/21 1140  BP: (!) 155/91 (!) 166/81  Pulse: (!) 102 98  Resp:    Temp: 98.3 F (36.8 C) 99.3 F (37.4 C)  SpO2: 94% 95%   No intake or output data in the 24 hours ending 02/22/21 1237 Filed Weights   02/20/21 1504  Weight: 39.9 kg    ROS: Review of Systems  Unable to perform ROS: Acuity of condition   Exam: Physical Exam HENT:     Head: Normocephalic.  Eyes:     General: Lids are normal.     Comments: Patient refused me opening up her eyes.  Cardiovascular:     Rate and Rhythm: Normal rate and regular rhythm.     Heart sounds: Normal heart sounds, S1 normal and S2 normal.  Pulmonary:     Breath sounds: Examination of the right-lower field reveals decreased breath sounds. Examination of the left-lower field reveals decreased breath sounds. Decreased breath sounds present. No wheezing, rhonchi or rales.  Abdominal:     Palpations: Abdomen is soft.     Tenderness: There is no abdominal tenderness.  Musculoskeletal:     Right lower leg: No swelling.     Left lower leg: No swelling.  Skin:    General: Skin is warm.     Findings: No rash.  Neurological:     Mental Status: She is disoriented.       Data Reviewed: Basic Metabolic Panel: Recent Labs  Lab 02/20/21 1519 02/22/21 0432  NA 137 144  K 4.4 4.1  CL 109  115*  CO2 23 22  GLUCOSE 89 108*  BUN 49* 26*  CREATININE 1.15* 1.10*  CALCIUM 9.7 9.3   Liver Function Tests: Recent Labs  Lab 02/20/21 1519  AST 20  ALT 13  ALKPHOS 113  BILITOT 0.9  PROT 8.1  ALBUMIN 3.3*   CBC: Recent Labs  Lab 02/20/21 1519 02/22/21 0432  WBC 12.3* 12.3*  NEUTROABS 10.6*  --   HGB 11.9* 11.0*  HCT 38.1 34.6*  MCV 105.0* 105.5*  PLT 297 281     Recent Results (from the past 240 hour(s))  Resp Panel by RT-PCR (Flu A&B, Covid) Nasopharyngeal Swab     Status: None   Collection Time: 02/20/21  4:39 PM   Specimen: Nasopharyngeal Swab; Nasopharyngeal(NP) swabs in vial transport medium  Result Value Ref Range Status   SARS Coronavirus 2 by RT PCR NEGATIVE NEGATIVE Final    Comment: (NOTE) SARS-CoV-2 target nucleic acids are NOT DETECTED.  The SARS-CoV-2 RNA is generally detectable in upper respiratory specimens during the acute phase of infection. The lowest concentration of SARS-CoV-2 viral copies this assay can detect is 138 copies/mL. A negative result does not preclude SARS-Cov-2 infection and should not be used as the sole basis for treatment or  other patient management decisions. A negative result may occur with  improper specimen collection/handling, submission of specimen other than nasopharyngeal swab, presence of viral mutation(s) within the areas targeted by this assay, and inadequate number of viral copies(<138 copies/mL). A negative result must be combined with clinical observations, patient history, and epidemiological information. The expected result is Negative.  Fact Sheet for Patients:  EntrepreneurPulse.com.au  Fact Sheet for Healthcare Providers:  IncredibleEmployment.be  This test is no t yet approved or cleared by the [place] FDA and  has been authorized for detection and/or diagnosis of SARS-CoV-2 by FDA under an Emergency Use Authorization (EUA). This EUA will remain  in  effect (meaning this test can be used) for the duration of the COVID-19 declaration under Section 564(b)(1) of the Act, 21 U.S.C.section 360bbb-3(b)(1), unless the authorization is terminated  or revoked sooner.       Influenza A by PCR NEGATIVE NEGATIVE Final   Influenza B by PCR NEGATIVE NEGATIVE Final    Comment: (NOTE) The Xpert Xpress SARS-CoV-2/FLU/RSV plus assay is intended as an aid in the diagnosis of influenza from Nasopharyngeal swab specimens and should not be used as a sole basis for treatment. Nasal washings and aspirates are unacceptable for Xpert Xpress SARS-CoV-2/FLU/RSV testing.  Fact Sheet for Patients: EntrepreneurPulse.com.au  Fact Sheet for Healthcare Providers: IncredibleEmployment.be  This test is not yet approved or cleared by the [place] FDA and has been authorized for detection and/or diagnosis of SARS-CoV-2 by FDA under an Emergency Use Authorization (EUA). This EUA will remain in effect (meaning this test can be used) for the duration of the COVID-19 declaration under Section 564(b)(1) of the Act, 21 U.S.C. section 360bbb-3(b)(1), unless the authorization is terminated or revoked.  Performed at [facility], [address] [place], [place] [address]      Studies: CT Head Wo Contrast  Result Date: 02/20/2021 CLINICAL DATA:  Head trauma EXAM: CT HEAD WITHOUT CONTRAST TECHNIQUE: Contiguous axial images were obtained from the base of the skull through the vertex without intravenous contrast. COMPARISON:  01/12/2021 FINDINGS: Brain: No acute infarct or hemorrhage. Lateral ventricles and midline structures are stable. No acute extra-axial fluid collections. No mass effect. Vascular: No hyperdense vessel or unexpected calcification. Skull: Normal. Negative for fracture or focal lesion. Sinuses/Orbits: Minimal fluid within the right maxillary sinus. Remaining paranasal sinuses are clear. Postsurgical changes  left orbit. Other: None. IMPRESSION: 1. No acute intracranial process. 2. Minimal right maxillary sinus disease. Electronically Signed   By: Randa Ngo M.D.   On: 02/20/2021 19:04   CT Angio Chest PE W and/or Wo Contrast  Result Date: 02/20/2021 CLINICAL DATA:  PE suspected, high prob No additional relevant history provided or available. EXAM: CT ANGIOGRAPHY CHEST WITH CONTRAST TECHNIQUE: Multidetector CT imaging of the chest was performed using the standard protocol during bolus administration of intravenous contrast. Multiplanar CT image reconstructions and MIPs were obtained to evaluate the vascular anatomy. CONTRAST:  9m OMNIPAQUE IOHEXOL 350 MG/ML SOLN COMPARISON:  Most recent chest radiograph 01/12/2021. Chest CT 11/03/2017 FINDINGS: Cardiovascular: There are no filling defects within the pulmonary arteries to suggest pulmonary embolus. Mild aortic atherosclerosis and tortuosity. Cardiomegaly with primarily right heart dilatation. Mild reflux of contrast into the IVC. No pericardial effusion. Mediastinum/Nodes: 12 mm right hilar node, comparison to priors difficult due to differences in technique. No left hilar adenopathy. 13 mm prominent subcarinal node. No thyroid nodule. Patulous esophagus. Lungs/Pleura: Multifocal bronchiectasis that is progressed from prior exam. Areas of bronchial filling involving  the right lower lobe. There are multifocal nodular, tree-in-bud, and consolidative airspace opacities throughout both lungs. Many of these consolidative areas are calcified or high-density. Overall interval progression from 2018 CT. Similar trace left pleural effusion. No findings of pulmonary edema. Upper Abdomen: Assessed on concurrent abdominal CT, reported separately. Musculoskeletal: Scoliosis and exaggerated thoracic kyphosis. Multilevel degenerative change in the spine. There are no acute or suspicious osseous abnormalities. Review of the MIP images confirms the above findings. IMPRESSION: 1.  No pulmonary embolus. 2. Progressive multifocal bronchiectasis with multifocal nodular, tree-in-bud, and consolidative airspace opacities throughout both lungs. Definite progression in parenchymal findings from 2018. Findings are most consistent with progression of chronic indolent infection infection, Mycobacterium avium complex. 3. Cardiomegaly with primarily right heart dilatation. Mild reflux of contrast into the IVC consistent with elevated right heart pressures. 4. Mild mediastinal and right hilar adenopathy is likely reactive. 5. Trace left pleural effusion. Aortic Atherosclerosis (ICD10-I70.0). Electronically Signed   By: Keith Rake M.D.   On: 02/20/2021 19:14   CT Cervical Spine Wo Contrast  Result Date: 02/20/2021 CLINICAL DATA:  Neck trauma EXAM: CT CERVICAL SPINE WITHOUT CONTRAST TECHNIQUE: Multidetector CT imaging of the cervical spine was performed without intravenous contrast. Multiplanar CT image reconstructions were also generated. COMPARISON:  03/03/2011 FINDINGS: Alignment: Minimal anterolisthesis of C4 relative to C5, stable. Otherwise alignment is anatomic. Skull base and vertebrae: No acute fracture. No primary bone lesion or focal pathologic process. Soft tissues and spinal canal: No prevertebral fluid or swelling. No visible canal hematoma. Disc levels: There is mild diffuse cervical spondylosis greatest at C5-6. Prominent facet hypertrophic changes greatest at C3-4 and C4-5. Upper chest: Airway is patent. Patchy areas of airspace disease are seen at the apices. No effusion or pneumothorax. Other: Reconstructed images demonstrate no additional findings. IMPRESSION: 1. No acute cervical spine fracture. 2. Multilevel cervical spondylosis unchanged. 3. Patchy bilateral upper lobe airspace disease. Electronically Signed   By: Randa Ngo M.D.   On: 02/20/2021 19:08   CT ABDOMEN PELVIS W CONTRAST  Result Date: 02/20/2021 CLINICAL DATA:  Abdominal distension. EXAM: CT ABDOMEN AND  PELVIS WITH CONTRAST TECHNIQUE: Multidetector CT imaging of the abdomen and pelvis was performed using the standard protocol following bolus administration of intravenous contrast. CONTRAST:  58m OMNIPAQUE IOHEXOL 350 MG/ML SOLN COMPARISON:  No prior abdominal imaging. Chest CT performed concurrently, reported separately. FINDINGS: Lower chest: Assessed on concurrent chest CT, reported separately. Hepatobiliary: Mild contrast refluxing into the hepatic veins and IVC. No focal hepatic lesion. Unremarkable gallbladder. No calcified gallstone or findings of cholecystitis. Pancreas: Parenchymal atrophy. Pancreas is partially obscured by motion. No evidence of pancreatic inflammation, ductal dilatation or mass. Spleen: Normal in size without focal abnormality. Adrenals/Urinary Tract: No adrenal nodule. No hydronephrosis or perinephric edema. Small left renal cyst. Symmetric excretion on delayed phase imaging. Urinary bladder is unremarkable. Stomach/Bowel: Bowel evaluation is limited in the absence of enteric contrast, paucity of intra-abdominal fat, and patient motion. Stomach is grossly unremarkable. There is no small bowel dilatation or evidence of obstruction. Moderate large volume of stool throughout the colon. There is stool distending the rectum. Mild stranding of the perirectal fat. Appendix not confidently visualized, no evidence of appendicitis. Vascular/Lymphatic: Aortic atherosclerosis and tortuosity. Patent portal vein. No enlarged abdominopelvic lymph nodes. Reproductive: Hysterectomy.  No evidence of adnexal mass. Other: Trace free fluid in the pelvis. No free air or focal abscess. No abdominal wall hernia. Musculoskeletal: Scoliosis and degenerative change in the spine. There are no acute or suspicious osseous  abnormalities. IMPRESSION: 1. Moderate large volume of stool throughout the colon with stool distending the rectum, suggesting fecal impaction. Mild stranding of the perirectal fat. No obstruction  or perforation. 2. Trace free fluid in the pelvis is likely reactive. 3. Mild contrast refluxing into the hepatic veins and IVC, can be seen with right heart failure. Aortic Atherosclerosis (ICD10-I70.0). Electronically Signed   By: Keith Rake M.D.   On: 02/20/2021 19:17    Scheduled Meds: . enoxaparin (LOVENOX) injection  30 mg Subcutaneous Q24H  . melatonin  3 mg Oral QHS  . multivitamin with minerals   Oral Daily  . polyethylene glycol  17 g Oral Daily  . timolol  1 drop Both Eyes BID   Continuous Infusions: . azithromycin Stopped (02/21/21 2300)  . cefTRIAXone (ROCEPHIN)  IV Stopped (02/21/21 1604)  . dextrose 5 % and 0.45% NaCl 50 mL/hr at 02/21/21 1729    Assessment/Plan:  1. Acute delirium with history of underlying dementia.  Family thinks it has gotten worse since Seroquel was started.  Continue as needed Haldol.  CT scan of the head negative.  Urinalysis negative.  CT scan of the chest showing pneumonia that could be MAI.  Empirically placed on antibiotics Rocephin and Zithromax.  IV fluid hydration.  TSH normal range.  We will check a B12 and RPR.  Swallow evaluation tomorrow.  Patient is n.p.o. at this time. 2. Multifocal pneumonia seen on CT scan of the chest on Rocephin and Zithromax.  Second procalcitonin actually normalized.  Could be MAI. 3. Elevated lithium level.  Continue IV fluids and hold lithium. 4. Dehydration with chronic kidney disease stage IIIa.  IV fluid hydration 5. Underweight due to inadequate caloric intake.  BMI 17.77 6. Macrocytic anemia check B12 level.      Code Status:     Code Status Orders  (From admission, onward)         Start     Ordered   02/21/21 1453  Full code  Continuous        02/21/21 1456        Code Status History    This patient has a current code status but no historical code status.   Advance Care Planning Activity     Family Communication: Spoke with the patient's husband on the phone.  He referred to me to  speak to the patient's daughter.  I spoke with the patient's daughter.  She will become now the main contact during the hospital course. Disposition Plan: Status is: Inpatient  Dispo: The patient is from: Home              Anticipated d/c is to: To be determined based on clinical course              Patient currently still with acute delirium and unable to communicate at this time.   Difficult to place patient.  Hopefully not.  Consultants:  Psychiatry  Antibiotics:  Rocephin  Zithromax  Time spent: 32 minutes, case discussed with pulmonary  [place]  [facility]

## 2021-02-22 NOTE — Consult Note (Signed)
[facility] Psych ED Progress Note  02/22/2021 4:14 PM Sierra Benson  MRN:  [id number]   Subjective:  Follow-up for consult on delirium and agitation. Patient continues to be nonverbal, and does not wake to calling name or to gentle shaking of arm. Tremors less noticeable today. Chart and labs reviewed.    Principal Problem: Delirium Diagnosis:  Principal Problem:   Delirium Active Problems:   Underweight due to inadequate caloric intake   Bipolar 1 disorder, depressed, moderate (HCC)   Dehydration   Multifocal pneumonia   Bronchiectasis (HCC)   Dementia with behavioral disturbance (HCC)   Elevated lithium level   Stage 3a chronic kidney disease (HCC)   Macrocytic anemia  Total Time spent with patient: 20 minutes  Past Psychiatric History: See previous  Past Medical History:  Past Medical History:  Diagnosis Date  . Bacterial pneumonia 11/2015  . Cystocele   . Glaucoma   . Insomnia   . Menopausal state   . Mitral valve disorder   . Nocturia   . Prediabetes 06/19/2015  . Rectocele   . Vaginal atrophy   . Varicose veins   . Vitamin D deficiency     Past Surgical History:  Procedure Laterality Date  . BREAST BIOPSY Right    benign nodule  . EYE SURGERY Left    cataract removed  . FOOT SURGERY    . VAGINAL HYSTERECTOMY     menorrhagia   Family History:  Family History  Problem Relation Age of Onset  . Diabetes Brother   . Lung cancer Brother   . Breast cancer Sister   . Ovarian cancer Neg Hx   . Colon cancer Neg Hx   . Heart disease Neg Hx    Family Psychiatric  History: See Previous Social History:  Social History   Substance and Sexual Activity  Alcohol Use No     Social History   Substance and Sexual Activity  Drug Use No    Social History   Socioeconomic History  . Marital status: Married    Spouse name: Not on file  . Number of children: Not on file  . Years of education: Not on file  . Highest education level: Not on file  Occupational  History  . Not on file  Tobacco Use  . Smoking status: Former Research scientist (life sciences)  . Smokeless tobacco: Never Used  Vaping Use  . Vaping Use: Never used  Substance and Sexual Activity  . Alcohol use: No  . Drug use: No  . Sexual activity: Yes    Birth control/protection: Surgical  Other Topics Concern  . Not on file  Social History Narrative  . Not on file   Social Determinants of Health   Financial Resource Strain: Not on file  Food Insecurity: Not on file  Transportation Needs: Not on file  Physical Activity: Not on file  Stress: Not on file  Social Connections: Not on file    Sleep: Fair  Appetite:  Poor  Current Medications: Current Facility-Administered Medications  Medication Dose Route Frequency Provider Last Rate Last Admin  . acetaminophen (TYLENOL) tablet 650 mg  650 mg Oral Q6H PRN Agbata, Tochukwu, MD       Or  . acetaminophen (TYLENOL) suppository 650 mg  650 mg Rectal Q6H PRN Agbata, Tochukwu, MD      . azithromycin (ZITHROMAX) 500 mg in sodium chloride 0.9 % 250 mL IVPB  500 mg Intravenous Q24H Agbata, Tochukwu, MD   Stopped at 02/21/21 2300  . cefTRIAXone (  ROCEPHIN) 2 g in sodium chloride 0.9 % 100 mL IVPB  2 g Intravenous Q24H Agbata, Tochukwu, MD 200 mL/hr at 02/22/21 1507 2 g at 02/22/21 1507  . dextrose 5 %-0.45 % sodium chloride infusion   Intravenous Continuous Agbata, Tochukwu, MD 50 mL/hr at 02/22/21 1505 New Bag at 02/22/21 1505  . enoxaparin (LOVENOX) injection 30 mg  30 mg Subcutaneous Q24H Agbata, Tochukwu, MD   30 mg at 02/21/21 2211  . haloperidol lactate (HALDOL) injection 1 mg  1 mg Intramuscular Q8H PRN Salley Scarlet, MD   1 mg at 02/22/21 0252  . melatonin tablet 3 mg  3 mg Oral QHS Salley Scarlet, MD      . multivitamin with minerals tablet   Oral Daily Agbata, Tochukwu, MD      . ondansetron (ZOFRAN) tablet 4 mg  4 mg Oral Q6H PRN Agbata, Tochukwu, MD       Or  . ondansetron (ZOFRAN) injection 4 mg  4 mg Intravenous Q6H PRN Agbata, Tochukwu,  MD      . polyethylene glycol (MIRALAX / GLYCOLAX) packet 17 g  17 g Oral Daily Vanessa Avera, MD      . timolol (TIMOPTIC) 0.25 % ophthalmic solution 1 drop  1 drop Both Eyes BID Wieting, Richard, MD   1 drop at 02/22/21 1515    Lab Results:  Results for orders placed or performed during the hospital encounter of 02/20/21 (from the past 48 hour(s))  Resp Panel by RT-PCR (Flu A&B, Covid) Nasopharyngeal Swab     Status: None   Collection Time: 02/20/21  4:39 PM   Specimen: Nasopharyngeal Swab; Nasopharyngeal(NP) swabs in vial transport medium  Result Value Ref Range   SARS Coronavirus 2 by RT PCR NEGATIVE NEGATIVE    Comment: (NOTE) SARS-CoV-2 target nucleic acids are NOT DETECTED.  The SARS-CoV-2 RNA is generally detectable in upper respiratory specimens during the acute phase of infection. The lowest concentration of SARS-CoV-2 viral copies this assay can detect is 138 copies/mL. A negative result does not preclude SARS-Cov-2 infection and should not be used as the sole basis for treatment or other patient management decisions. A negative result may occur with  improper specimen collection/handling, submission of specimen other than nasopharyngeal swab, presence of viral mutation(s) within the areas targeted by this assay, and inadequate number of viral copies(<138 copies/mL). A negative result must be combined with clinical observations, patient history, and epidemiological information. The expected result is Negative.  Fact Sheet for Patients:  EntrepreneurPulse.com.au  Fact Sheet for Healthcare Providers:  IncredibleEmployment.be  This test is no t yet approved or cleared by the [place] FDA and  has been authorized for detection and/or diagnosis of SARS-CoV-2 by FDA under an Emergency Use Authorization (EUA). This EUA will remain  in effect (meaning this test can be used) for the duration of the COVID-19 declaration under Section  564(b)(1) of the Act, 21 U.S.C.section 360bbb-3(b)(1), unless the authorization is terminated  or revoked sooner.       Influenza A by PCR NEGATIVE NEGATIVE   Influenza B by PCR NEGATIVE NEGATIVE    Comment: (NOTE) The Xpert Xpress SARS-CoV-2/FLU/RSV plus assay is intended as an aid in the diagnosis of influenza from Nasopharyngeal swab specimens and should not be used as a sole basis for treatment. Nasal washings and aspirates are unacceptable for Xpert Xpress SARS-CoV-2/FLU/RSV testing.  Fact Sheet for Patients: EntrepreneurPulse.com.au  Fact Sheet for Healthcare Providers: IncredibleEmployment.be  This test is not yet approved  or cleared by the [place] and has been authorized for detection and/or diagnosis of SARS-CoV-2 by FDA under an Emergency Use Authorization (EUA). This EUA will remain in effect (meaning this test can be used) for the duration of the COVID-19 declaration under Section 564(b)(1) of the Act, 21 U.S.C. section 360bbb-3(b)(1), unless the authorization is terminated or revoked.  Performed at [facility], [address] [place], [place] [address]   Urinalysis, Complete w Microscopic     Status: Abnormal   Collection Time: 02/20/21  5:52 PM  Result Value Ref Range   Color, Urine STRAW (A) YELLOW   APPearance CLEAR (A) CLEAR   Specific Gravity, Urine 1.018 1.005 - 1.030   pH 7.0 5.0 - 8.0   Glucose, UA NEGATIVE NEGATIVE mg/dL   Hgb urine dipstick SMALL (A) NEGATIVE   Bilirubin Urine NEGATIVE NEGATIVE   Ketones, ur NEGATIVE NEGATIVE mg/dL   Protein, ur NEGATIVE NEGATIVE mg/dL   Nitrite NEGATIVE NEGATIVE   Leukocytes,Ua NEGATIVE NEGATIVE   RBC / HPF 0-5 0 - 5 RBC/hpf   WBC, UA 0-5 0 - 5 WBC/hpf   Bacteria, UA FEW (A) NONE SEEN   Squamous Epithelial / LPF NONE SEEN 0 - 5   Mucus PRESENT     Comment: Performed at [facility], [address] [place], [place] [address]  Lithium level      Status: Abnormal   Collection Time: 02/20/21  6:19 PM  Result Value Ref Range   Lithium Lvl 1.32 (H) 0.60 - 1.20 mmol/L    Comment: Performed at [facility], [address] [place], [place] [address]  TSH     Status: None   Collection Time: 02/20/21  6:19 PM  Result Value Ref Range   TSH 2.548 0.350 - 4.500 uIU/mL    Comment: Performed by a 3rd Generation assay with a functional sensitivity of <=0.01 uIU/mL. Performed at [facility], [address] [place], [place] [address]   T4, free     Status: None   Collection Time: 02/20/21  6:19 PM  Result Value Ref Range   Free T4 1.08 0.61 - 1.12 ng/dL    Comment: (NOTE) Biotin ingestion may interfere with free T4 tests. If the results are inconsistent with the TSH level, previous test results, or the clinical presentation, then consider biotin interference. If needed, order repeat testing after stopping biotin. Performed at [facility], [address] [place], [place] [address]   CBC     Status: Abnormal   Collection Time: 02/22/21  4:32 AM  Result Value Ref Range   WBC 12.3 (H) 4.0 - 10.5 K/uL   RBC 3.28 (L) 3.87 - 5.11 MIL/uL   Hemoglobin 11.0 (L) 12.0 - 15.0 g/dL   HCT 34.6 (L) 36.0 - 46.0 %   MCV 105.5 (H) 80.0 - 100.0 fL   MCH 33.5 26.0 - 34.0 pg   MCHC 31.8 30.0 - 36.0 g/dL   RDW 15.6 (H) 11.5 - 15.5 %   Platelets 281 150 - 400 K/uL   nRBC 0.0 0.0 - 0.2 %    Comment: Performed at [facility], [address] [place], [place] [address]  Basic metabolic panel     Status: Abnormal   Collection Time: 02/22/21  4:32 AM  Result Value Ref Range   Sodium 144 135 - 145 mmol/L   Potassium 4.1 3.5 - 5.1 mmol/L   Chloride 115 (H) 98 - 111 mmol/L   CO2 22  22 - 32 mmol/L   Glucose, Bld 108 (H) 70 - 99 mg/dL    Comment: Glucose reference range applies only to samples taken after fasting for at least 8 hours.   BUN 26 (H) 8 - 23 mg/dL   Creatinine, Ser 1.10 (H) 0.44 - 1.00 mg/dL    Calcium 9.3 8.9 - 10.3 mg/dL   GFR, Estimated 48 (L) >60 mL/min    Comment: (NOTE) Calculated using the CKD-EPI Creatinine Equation (2021)    Anion gap 7 5 - 15    Comment: Performed at [facility], [address] [place], [place] [address]  Procalcitonin - Baseline     Status: None   Collection Time: 02/22/21  9:55 AM  Result Value Ref Range   Procalcitonin <0.10 ng/mL    Comment:        Interpretation: PCT (Procalcitonin) <= 0.5 ng/mL: Systemic infection (sepsis) is not likely. Local bacterial infection is possible. (NOTE)       Sepsis PCT Algorithm           Lower Respiratory Tract                                      Infection PCT Algorithm    ----------------------------     ----------------------------         PCT < 0.25 ng/mL                PCT < 0.10 ng/mL          Strongly encourage             Strongly discourage   discontinuation of antibiotics    initiation of antibiotics    ----------------------------     -----------------------------       PCT 0.25 - 0.50 ng/mL            PCT 0.10 - 0.25 ng/mL               OR       >80% decrease in PCT            Discourage initiation of                                            antibiotics      Encourage discontinuation           of antibiotics    ----------------------------     -----------------------------         PCT >= 0.50 ng/mL              PCT 0.26 - 0.50 ng/mL               AND        <80% decrease in PCT             Encourage initiation of                                             antibiotics       Encourage continuation           of antibiotics    ----------------------------     -----------------------------  PCT >= 0.50 ng/mL                  PCT > 0.50 ng/mL               AND         increase in PCT                  Strongly encourage                                      initiation of antibiotics    Strongly encourage escalation           of antibiotics                                      -----------------------------                                           PCT <= 0.25 ng/mL                                                 OR                                        > 80% decrease in PCT                                      Discontinue / Do not initiate                                             antibiotics  Performed at [facility], [address], [place], [place] [address]     Blood Alcohol level:  No results found for: [facility]  Physical Findings: AIMS:  , ,  ,  ,    CIWA:    COWS:     Musculoskeletal: Strength & Muscle Tone: decreased Gait & Station: unable to stand Patient leans: N/A  Psychiatric Specialty Exam: Physical Exam Vitals and nursing note reviewed.  Constitutional:      Appearance: She is ill-appearing.  HENT:     Head: Normocephalic and atraumatic.     Right Ear: External ear normal.     Left Ear: External ear normal.     Nose: Nose normal.     Mouth/Throat:     Mouth: Mucous membranes are dry.     Pharynx: Oropharynx is clear.  Skin:    General: Skin is warm and dry.  Neurological:     Mental Status: She is disoriented.  Psychiatric:        Attention and Perception: She is inattentive.        Cognition and Memory: Cognition is impaired. Memory is impaired.     Review of Systems  Unable to perform ROS:  Acuity of condition    Blood pressure (!) 166/81, pulse 98, temperature 99.3 F (37.4 C), temperature source Axillary, resp. rate (!) 23, height 4\' 11"  (1.499 m), weight 39.9 kg, SpO2 95 %.Body mass index is 17.77 kg/m.  General Appearance: Disheveled  Eye Contact:  Absent  Speech:  Negative  Volume:  Negative  Mood:  Negative  Affect:  Negative  Thought Process:  Disorganized  Orientation:  Negative  Thought Content:  Negative  Suicidal Thoughts:  Unknown  Homicidal Thoughts:  Unknown  Memory:  Negative  Judgement:  Negative  Insight:  Negative  Psychomotor Activity:  Negative  Concentration:  Concentration:  Negative  Recall:  Negative  Fund of Knowledge:  Negative  Language:  Negative  Akathisia:  Negative  Handed:  Right  AIMS (if indicated):     Assets:  Financial Resources/Insurance Housing Intimacy  ADL's:  Impaired  Cognition:  Impaired,  Severe  Sleep:         Treatment Plan Summary: Daily contact with patient to assess and evaluate symptoms and progress in treatment and Medication management. Patient is an 87 year old female with history of bipolar disorder presenting with delirium. Lithium level noted to be elevated at 1.32. Continue to recommend holding lithium at this time.  If unable to tolerate PO, recommend low dose Haldol 1 mg q8hr PRN IM or IV. If using IV Haldol, recommend serial EKG to monitor for QTc prolongation. Also suggest melatonin 3 mg QHS to assist with maintaining sleep, wake cycle. Also recommend maintaining sleep-wake cycle as much as possible with lights on, windows open during the day, out of bed to chair as feasible. Consult team will continue to follow while on medical floor. Agree with discontinuation of Seroquel as family felt this made worse. Also agree with palliative care consult to discuss goals of care.  Salley Scarlet, MD 02/22/2021, 4:14 PM

## 2021-02-22 NOTE — Consult Note (Signed)
Pulmonary Medicine          Date: 02/22/2021,   MRN# [id number] RONDALYN BELFORD 09-24-1929     AdmissionWeight: 39.9 kg                 CurrentWeight: 39.9 kg   Referring physician: Dr. Francine Graven   CHIEF COMPLAINT:   Progressive multifocal pneumonia with bronchiectasis mediastinal and hilar adenopathy   HISTORY OF PRESENT ILLNESS   This is a pleasant 87 year old female with a history of psychiatric disorder including bipolar, came in for confusion with altered mental status, husband states she has had poor p.o. nutrition and weight loss over the last 1 month has been on her normal antidepression medications,Labs show sodium 137, potassium 4.4, chloride 109, bicarb 23, glucose 89, BUN 49, creatinine 1.15, calcium 9.7, alkaline phosphatase 113, albumin 3.3, AST 20, ALT 13, total protein 8.1, total CK 8, procalcitonin 0.14, white count 12.3, hemoglobin 11.9, hematocrit 38.1, MCV 105, RDW 15.4, platelet count 297, lithium 1.32, TSH 2.5, T4 1.08 CT scan of abdomen and pelvis shows moderate large volume of stool throughout the colon with stool distending the rectum, suggesting fecal impaction. Mild stranding of the perirectal fat. No obstruction or perforation. Trace free fluid in the pelvis is likely reactive. Mild contrast refluxing into the hepatic veins and IVC, can be seen with right heart failure. CT angiogram of the chest shows no pulmonary embolus. Progressive multifocal bronchiectasis with multifocal nodular, tree-in-bud, and consolidative airspace opacities throughout both lungs. Definite progression in parenchymal findings from 2018. Findings are most consistent with progression of chronic indolent  infection, Mycobacterium avium complex. Cardiomegaly with primarily right heart dilatation. Mild reflux of contrast into the IVC consistent with elevated right heart pressures. Mild mediastinal and right hilar adenopathy is likely reactive. Trace left pleural effusion. Cervical  spine CT shows no acute cervical spine fracture. Multilevel cervical spondylosis unchanged. Patchy bilateral upper lobe airspace disease. CT scan of the head without contrast shows no acute intracranial process.  PCCM consultation for additional evaluation managed   PAST MEDICAL HISTORY   Past Medical History:  Diagnosis Date  . Bacterial pneumonia 11/2015  . Cystocele   . Glaucoma   . Insomnia   . Menopausal state   . Mitral valve disorder   . Nocturia   . Prediabetes 06/19/2015  . Rectocele   . Vaginal atrophy   . Varicose veins   . Vitamin D deficiency      SURGICAL HISTORY   Past Surgical History:  Procedure Laterality Date  . BREAST BIOPSY Right    benign nodule  . EYE SURGERY Left    cataract removed  . FOOT SURGERY    . VAGINAL HYSTERECTOMY     menorrhagia     FAMILY HISTORY   Family History  Problem Relation Age of Onset  . Diabetes Brother   . Lung cancer Brother   . Breast cancer Sister   . Ovarian cancer Neg Hx   . Colon cancer Neg Hx   . Heart disease Neg Hx      SOCIAL HISTORY   Social History   Tobacco Use  . Smoking status: Former Research scientist (life sciences)  . Smokeless tobacco: Never Used  Vaping Use  . Vaping Use: Never used  Substance Use Topics  . Alcohol use: No  . Drug use: No     MEDICATIONS    Home Medication:    Current Medication:  Current Facility-Administered Medications:  .  acetaminophen (TYLENOL) tablet 650 mg,  650 mg, Oral, Q6H PRN **OR** acetaminophen (TYLENOL) suppository 650 mg, 650 mg, Rectal, Q6H PRN, Agbata, Tochukwu, MD .  azithromycin (ZITHROMAX) 500 mg in sodium chloride 0.9 % 250 mL IVPB, 500 mg, Intravenous, Q24H, Agbata, Tochukwu, MD, Last Rate: 250 mL/hr at 02/21/21 2211, 500 mg at 02/21/21 2211 .  cefTRIAXone (ROCEPHIN) 2 g in sodium chloride 0.9 % 100 mL IVPB, 2 g, Intravenous, Q24H, Agbata, Tochukwu, MD, Stopped at 02/21/21 1604 .  dextrose 5 %-0.45 % sodium chloride infusion, , Intravenous, Continuous, Agbata,  Tochukwu, MD, Last Rate: 50 mL/hr at 02/21/21 1729, New Bag at 02/21/21 1729 .  enoxaparin (LOVENOX) injection 30 mg, 30 mg, Subcutaneous, Q24H, Agbata, Tochukwu, MD, 30 mg at 02/21/21 2211 .  haloperidol lactate (HALDOL) injection 1 mg, 1 mg, Intramuscular, Q8H PRN, Salley Scarlet, MD, 1 mg at 02/22/21 0252 .  latanoprost (XALATAN) 0.005 % ophthalmic solution 1 drop, 1 drop, Both Eyes, QHS, Agbata, Tochukwu, MD .  melatonin tablet 3 mg, 3 mg, Oral, QHS, Salley Scarlet, MD .  multivitamin with minerals tablet, , Oral, Daily, Agbata, Tochukwu, MD .  ondansetron (ZOFRAN) tablet 4 mg, 4 mg, Oral, Q6H PRN **OR** ondansetron (ZOFRAN) injection 4 mg, 4 mg, Intravenous, Q6H PRN, Agbata, Tochukwu, MD .  polyethylene glycol (MIRALAX / GLYCOLAX) packet 17 g, 17 g, Oral, Daily, Vanessa [place], MD .  QUEtiapine (SEROQUEL) tablet 25 mg, 25 mg, Oral, Q8H PRN, Salley Scarlet, MD .  QUEtiapine (SEROQUEL) tablet 50 mg, 50 mg, Oral, QHS, Agbata, Tochukwu, MD    ALLERGIES   Prednisone, Propoxyphene, Celecoxib, and Ibuprofen     REVIEW OF SYSTEMS    Review of Systems:  Gen:  Denies  fever, sweats, chills weigh loss  HEENT: Denies blurred vision, double vision, ear pain, eye pain, hearing loss, nose bleeds, sore throat Cardiac:  No dizziness, chest pain or heaviness, chest tightness,edema Resp:   Denies cough or sputum porduction, shortness of breath,wheezing, hemoptysis,  Gi: Denies swallowing difficulty, stomach pain, nausea or vomiting, diarrhea, constipation, bowel incontinence Gu:  Denies bladder incontinence, burning urine Ext:   Denies Joint pain, stiffness or swelling Skin: Denies  skin rash, easy bruising or bleeding or hives Endoc:  Denies polyuria, polydipsia , polyphagia or weight change Psych:   Denies depression, insomnia or hallucinations   Other:  All other systems negative   VS: BP (!) 155/91 (BP Location: Left Arm) Comment: patient moving arm, restless  Pulse (!) 102   Temp  98.3 F (36.8 C) (Axillary)   Resp (!) 23   Ht _0  (1.499 m)   Wt 39.9 kg   SpO2 94%   BMI 17.77 kg/m      PHYSICAL EXAM    GENERAL:NAD, no fevers, chills, no weakness no fatigue HEAD: Normocephalic, atraumatic.  EYES: Pupils equal, round, reactive to light. Extraocular muscles intact. No scleral icterus.  MOUTH: Moist mucosal membrane. Dentition intact. No abscess noted.  EAR, NOSE, THROAT: Clear without exudates. No external lesions.  NECK: Supple. No thyromegaly. No nodules. No JVD.  PULMONARY: Diffuse coarse rhonchi right sided +wheezes CARDIOVASCULAR: S1 and S2. Regular rate and rhythm. No murmurs, rubs, or gallops. No edema. Pedal pulses 2+ bilaterally.  GASTROINTESTINAL: Soft, nontender, nondistended. No masses. Positive bowel sounds. No hepatosplenomegaly.  MUSCULOSKELETAL: No swelling, clubbing, or edema. Range of motion full in all extremities.  NEUROLOGIC: Cranial nerves II through XII are intact. No gross focal neurological deficits. Sensation intact. Reflexes intact.  SKIN: No ulceration, lesions, rashes, or  cyanosis. Skin warm and dry. Turgor intact.  PSYCHIATRIC: Mood, affect within normal limits. The patient is awake, alert and oriented x 3. Insight, judgment intact.       IMAGING    CT Head Wo Contrast  Result Date: 02/20/2021 CLINICAL DATA:  Head trauma EXAM: CT HEAD WITHOUT CONTRAST TECHNIQUE: Contiguous axial images were obtained from the base of the skull through the vertex without intravenous contrast. COMPARISON:  01/12/2021 FINDINGS: Brain: No acute infarct or hemorrhage. Lateral ventricles and midline structures are stable. No acute extra-axial fluid collections. No mass effect. Vascular: No hyperdense vessel or unexpected calcification. Skull: Normal. Negative for fracture or focal lesion. Sinuses/Orbits: Minimal fluid within the right maxillary sinus. Remaining paranasal sinuses are clear. Postsurgical changes left orbit. Other: None. IMPRESSION: 1. No  acute intracranial process. 2. Minimal right maxillary sinus disease. Electronically Signed   By: Randa Ngo M.D.   On: 02/20/2021 19:04   CT Angio Chest PE W and/or Wo Contrast  Result Date: 02/20/2021 CLINICAL DATA:  PE suspected, high prob No additional relevant history provided or available. EXAM: CT ANGIOGRAPHY CHEST WITH CONTRAST TECHNIQUE: Multidetector CT imaging of the chest was performed using the standard protocol during bolus administration of intravenous contrast. Multiplanar CT image reconstructions and MIPs were obtained to evaluate the vascular anatomy. CONTRAST:  76m OMNIPAQUE IOHEXOL 350 MG/ML SOLN COMPARISON:  Most recent chest radiograph 01/12/2021. Chest CT 11/03/2017 FINDINGS: Cardiovascular: There are no filling defects within the pulmonary arteries to suggest pulmonary embolus. Mild aortic atherosclerosis and tortuosity. Cardiomegaly with primarily right heart dilatation. Mild reflux of contrast into the IVC. No pericardial effusion. Mediastinum/Nodes: 12 mm right hilar node, comparison to priors difficult due to differences in technique. No left hilar adenopathy. 13 mm prominent subcarinal node. No thyroid nodule. Patulous esophagus. Lungs/Pleura: Multifocal bronchiectasis that is progressed from prior exam. Areas of bronchial filling involving the right lower lobe. There are multifocal nodular, tree-in-bud, and consolidative airspace opacities throughout both lungs. Many of these consolidative areas are calcified or high-density. Overall interval progression from 2018 CT. Similar trace left pleural effusion. No findings of pulmonary edema. Upper Abdomen: Assessed on concurrent abdominal CT, reported separately. Musculoskeletal: Scoliosis and exaggerated thoracic kyphosis. Multilevel degenerative change in the spine. There are no acute or suspicious osseous abnormalities. Review of the MIP images confirms the above findings. IMPRESSION: 1. No pulmonary embolus. 2. Progressive  multifocal bronchiectasis with multifocal nodular, tree-in-bud, and consolidative airspace opacities throughout both lungs. Definite progression in parenchymal findings from 2018. Findings are most consistent with progression of chronic indolent infection infection, Mycobacterium avium complex. 3. Cardiomegaly with primarily right heart dilatation. Mild reflux of contrast into the IVC consistent with elevated right heart pressures. 4. Mild mediastinal and right hilar adenopathy is likely reactive. 5. Trace left pleural effusion. Aortic Atherosclerosis (ICD10-I70.0). Electronically Signed   By: MKeith RakeM.D.   On: 02/20/2021 19:14   CT Cervical Spine Wo Contrast  Result Date: 02/20/2021 CLINICAL DATA:  Neck trauma EXAM: CT CERVICAL SPINE WITHOUT CONTRAST TECHNIQUE: Multidetector CT imaging of the cervical spine was performed without intravenous contrast. Multiplanar CT image reconstructions were also generated. COMPARISON:  03/03/2011 FINDINGS: Alignment: Minimal anterolisthesis of C4 relative to C5, stable. Otherwise alignment is anatomic. Skull base and vertebrae: No acute fracture. No primary bone lesion or focal pathologic process. Soft tissues and spinal canal: No prevertebral fluid or swelling. No visible canal hematoma. Disc levels: There is mild diffuse cervical spondylosis greatest at C5-6. Prominent facet hypertrophic changes greatest at C3-4  and C4-5. Upper chest: Airway is patent. Patchy areas of airspace disease are seen at the apices. No effusion or pneumothorax. Other: Reconstructed images demonstrate no additional findings. IMPRESSION: 1. No acute cervical spine fracture. 2. Multilevel cervical spondylosis unchanged. 3. Patchy bilateral upper lobe airspace disease. Electronically Signed   By: Randa Ngo M.D.   On: 02/20/2021 19:08   CT ABDOMEN PELVIS W CONTRAST  Result Date: 02/20/2021 CLINICAL DATA:  Abdominal distension. EXAM: CT ABDOMEN AND PELVIS WITH CONTRAST TECHNIQUE:  Multidetector CT imaging of the abdomen and pelvis was performed using the standard protocol following bolus administration of intravenous contrast. CONTRAST:  54m OMNIPAQUE IOHEXOL 350 MG/ML SOLN COMPARISON:  No prior abdominal imaging. Chest CT performed concurrently, reported separately. FINDINGS: Lower chest: Assessed on concurrent chest CT, reported separately. Hepatobiliary: Mild contrast refluxing into the hepatic veins and IVC. No focal hepatic lesion. Unremarkable gallbladder. No calcified gallstone or findings of cholecystitis. Pancreas: Parenchymal atrophy. Pancreas is partially obscured by motion. No evidence of pancreatic inflammation, ductal dilatation or mass. Spleen: Normal in size without focal abnormality. Adrenals/Urinary Tract: No adrenal nodule. No hydronephrosis or perinephric edema. Small left renal cyst. Symmetric excretion on delayed phase imaging. Urinary bladder is unremarkable. Stomach/Bowel: Bowel evaluation is limited in the absence of enteric contrast, paucity of intra-abdominal fat, and patient motion. Stomach is grossly unremarkable. There is no small bowel dilatation or evidence of obstruction. Moderate large volume of stool throughout the colon. There is stool distending the rectum. Mild stranding of the perirectal fat. Appendix not confidently visualized, no evidence of appendicitis. Vascular/Lymphatic: Aortic atherosclerosis and tortuosity. Patent portal vein. No enlarged abdominopelvic lymph nodes. Reproductive: Hysterectomy.  No evidence of adnexal mass. Other: Trace free fluid in the pelvis. No free air or focal abscess. No abdominal wall hernia. Musculoskeletal: Scoliosis and degenerative change in the spine. There are no acute or suspicious osseous abnormalities. IMPRESSION: 1. Moderate large volume of stool throughout the colon with stool distending the rectum, suggesting fecal impaction. Mild stranding of the perirectal fat. No obstruction or perforation. 2. Trace free  fluid in the pelvis is likely reactive. 3. Mild contrast refluxing into the hepatic veins and IVC, can be seen with right heart failure. Aortic Atherosclerosis (ICD10-I70.0). Electronically Signed   By: MKeith RakeM.D.   On: 02/20/2021 19:17      ASSESSMENT/PLAN   Respiratory distress with chronic pnemonia    - patient with confusion unclear if septic encephalopathy due to pna vs medication related from psych disease  - patient is unable to provide respiratory cultures due to AMS  - she is 87yo with severe cachexia and significant comorbid history, will ask palliative care to evaluate. - COVID19 negative  - supplemental O2 during my evaluation -room air - will perform infectious workup for pneumonia -Respiratory viral panel -serum fungitell -legionella ab -strep pneumoniae ur AG -Histoplasma Ur Ag -sputum resp cultures -AFB sputum expectorated specimen -reviewed pertinent imaging with patient today - ESR -AFR and aspergillus workup if patient is able    Thank you for allowing me to participate in the care of this patient.   Patient/Family are satisfied with care plan and all questions have been answered.  This document was prepared using Dragon voice recognition software and may include unintentional dictation errors.     FOttie Glazier M.D.  Division of [facility]

## 2021-02-23 ENCOUNTER — Inpatient Hospital Stay
Admit: 2021-02-23 | Discharge: 2021-02-23 | Disposition: A | Discharge status: Home / Self Care | Payer: Medicare HMO | Attending: Internal Medicine | Admitting: Internal Medicine

## 2021-02-23 DIAGNOSIS — F0391 Unspecified dementia with behavioral disturbance: Secondary | ICD-10-CM | POA: Diagnosis not present

## 2021-02-23 DIAGNOSIS — R7989 Other specified abnormal findings of blood chemistry: Secondary | ICD-10-CM | POA: Diagnosis not present

## 2021-02-23 DIAGNOSIS — J189 Pneumonia, unspecified organism: Secondary | ICD-10-CM | POA: Diagnosis not present

## 2021-02-23 DIAGNOSIS — R410 Disorientation, unspecified: Secondary | ICD-10-CM | POA: Diagnosis not present

## 2021-02-23 LAB — CBC
HCT: 37.9 % (ref 36.0–46.0)
Hemoglobin: 12.1 g/dL (ref 12.0–15.0)
MCH: 33.2 pg (ref 26.0–34.0)
MCHC: 31.9 g/dL (ref 30.0–36.0)
MCV: 104.1 fL — ABNORMAL HIGH (ref 80.0–100.0)
Platelets: 293 10*3/uL (ref 150–400)
RBC: 3.64 MIL/uL — ABNORMAL LOW (ref 3.87–5.11)
RDW: 15.3 % (ref 11.5–15.5)
WBC: 13.1 10*3/uL — ABNORMAL HIGH (ref 4.0–10.5)
nRBC: 0 % (ref 0.0–0.2)

## 2021-02-23 LAB — STREP PNEUMONIAE URINARY ANTIGEN: Strep Pneumo Urinary Antigen: NEGATIVE

## 2021-02-23 LAB — BASIC METABOLIC PANEL
Anion gap: 7 (ref 5–15)
BUN: 17 mg/dL (ref 8–23)
CO2: 21 mmol/L — ABNORMAL LOW (ref 22–32)
Calcium: 9 mg/dL (ref 8.9–10.3)
Chloride: 110 mmol/L (ref 98–111)
Creatinine, Ser: 0.94 mg/dL (ref 0.44–1.00)
GFR, Estimated: 58 mL/min — ABNORMAL LOW (ref >60)
Glucose, Bld: 128 mg/dL — ABNORMAL HIGH (ref 70–99)
Potassium: 4.2 mmol/L (ref 3.5–5.1)
Sodium: 138 mmol/L (ref 135–145)

## 2021-02-23 LAB — GLUCOSE, CAPILLARY: Glucose-Capillary: 116 mg/dL — ABNORMAL HIGH (ref 70–99)

## 2021-02-23 LAB — VITAMIN B12: Vitamin B-12: 396 pg/mL (ref 180–914)

## 2021-02-23 LAB — LITHIUM LEVEL: Lithium Lvl: 0.54 mmol/L — ABNORMAL LOW (ref 0.60–1.20)

## 2021-02-23 LAB — CK: Total CK: 91 U/L (ref 38–234)

## 2021-02-23 MED ORDER — BISACODYL 10 MG RE SUPP
10.0000 mg | Freq: Every day | RECTAL | Status: DC
  Administered 2021-02-23 – 2021-02-28 (×6): 10 mg via RECTAL
  Filled 2021-02-23 (×6): qty 1

## 2021-02-23 NOTE — Progress Notes (Signed)
Pulmonary Medicine          Date: 02/23/2021,   MRN# [id number] KAYDYN CHISM May 28, 1929     AdmissionWeight: 39.9 kg                 CurrentWeight: 39.9 kg   Referring physician: Dr. Francine Graven   CHIEF COMPLAINT:   Progressive multifocal pneumonia with bronchiectasis mediastinal and hilar adenopathy   HISTORY OF PRESENT ILLNESS   This is a pleasant 87 year old female with a history of psychiatric disorder including bipolar, came in for confusion with altered mental status, husband states she has had poor p.o. nutrition and weight loss over the last 1 month has been on her normal antidepression medications,Labs show sodium 137, potassium 4.4, chloride 109, bicarb 23, glucose 89, BUN 49, creatinine 1.15, calcium 9.7, alkaline phosphatase 113, albumin 3.3, AST 20, ALT 13, total protein 8.1, total CK 8, procalcitonin 0.14, white count 12.3, hemoglobin 11.9, hematocrit 38.1, MCV 105, RDW 15.4, platelet count 297, lithium 1.32, TSH 2.5, T4 1.08 CT scan of abdomen and pelvis shows moderate large volume of stool throughout the colon with stool distending the rectum, suggesting fecal impaction. Mild stranding of the perirectal fat. No obstruction or perforation. Trace free fluid in the pelvis is likely reactive. Mild contrast refluxing into the hepatic veins and IVC, can be seen with right heart failure. CT angiogram of the chest shows no pulmonary embolus. Progressive multifocal bronchiectasis with multifocal nodular, tree-in-bud, and consolidative airspace opacities throughout both lungs. Definite progression in parenchymal findings from 2018. Findings are most consistent with progression of chronic indolent  infection, Mycobacterium avium complex. Cardiomegaly with primarily right heart dilatation. Mild reflux of contrast into the IVC consistent with elevated right heart pressures. Mild mediastinal and right hilar adenopathy is likely reactive. Trace left pleural effusion. Cervical  spine CT shows no acute cervical spine fracture. Multilevel cervical spondylosis unchanged. Patchy bilateral upper lobe airspace disease. CT scan of the head without contrast shows no acute intracranial process.  PCCM consultation for additional evaluation managed    02/23/21- patient is stable, she is only on 1L/min [place].  Mentation remains an issue with mostly sleeping but opens eyes to verbal communication. Infectious workup in process.   PAST MEDICAL HISTORY   Past Medical History:  Diagnosis Date  . Bacterial pneumonia 11/2015  . Cystocele   . Glaucoma   . Insomnia   . Menopausal state   . Mitral valve disorder   . Nocturia   . Prediabetes 06/19/2015  . Rectocele   . Vaginal atrophy   . Varicose veins   . Vitamin D deficiency      SURGICAL HISTORY   Past Surgical History:  Procedure Laterality Date  . BREAST BIOPSY Right    benign nodule  . EYE SURGERY Left    cataract removed  . FOOT SURGERY    . VAGINAL HYSTERECTOMY     menorrhagia     FAMILY HISTORY   Family History  Problem Relation Age of Onset  . Diabetes Brother   . Lung cancer Brother   . Breast cancer Sister   . Ovarian cancer Neg Hx   . Colon cancer Neg Hx   . Heart disease Neg Hx      SOCIAL HISTORY   Social History   Tobacco Use  . Smoking status: Former Research scientist (life sciences)  . Smokeless tobacco: Never Used  Vaping Use  . Vaping Use: Never used  Substance Use Topics  . Alcohol use: No  .  Drug use: No     MEDICATIONS    Home Medication:    Current Medication:  Current Facility-Administered Medications:  .  acetaminophen (TYLENOL) tablet 650 mg, 650 mg, Oral, Q6H PRN **OR** acetaminophen (TYLENOL) suppository 650 mg, 650 mg, Rectal, Q6H PRN, Agbata, Tochukwu, MD .  azithromycin (ZITHROMAX) 500 mg in sodium chloride 0.9 % 250 mL IVPB, 500 mg, Intravenous, Q24H, Agbata, Tochukwu, MD, Stopped at 02/22/21 1720 .  bisacodyl (DULCOLAX) suppository 10 mg, 10 mg, Rectal, Daily, Wieting, Richard, MD .   cefTRIAXone (ROCEPHIN) 2 g in sodium chloride 0.9 % 100 mL IVPB, 2 g, Intravenous, Q24H, Agbata, Tochukwu, MD, Stopped at 02/22/21 1537 .  dextrose 5 %-0.45 % sodium chloride infusion, , Intravenous, Continuous, Wieting, Richard, MD, Last Rate: 70 mL/hr at 02/23/21 1213, New Bag at 02/23/21 1213 .  enoxaparin (LOVENOX) injection 30 mg, 30 mg, Subcutaneous, Q24H, Agbata, Tochukwu, MD, 30 mg at 02/22/21 2210 .  haloperidol lactate (HALDOL) injection 1 mg, 1 mg, Intramuscular, Q8H PRN, Salley Scarlet, MD, 1 mg at 02/22/21 0252 .  melatonin tablet 3 mg, 3 mg, Oral, QHS, Salley Scarlet, MD .  multivitamin with minerals tablet, , Oral, Daily, Agbata, Tochukwu, MD .  ondansetron (ZOFRAN) tablet 4 mg, 4 mg, Oral, Q6H PRN **OR** ondansetron (ZOFRAN) injection 4 mg, 4 mg, Intravenous, Q6H PRN, Agbata, Tochukwu, MD .  polyethylene glycol (MIRALAX / GLYCOLAX) packet 17 g, 17 g, Oral, Daily, Vanessa Duplin, MD .  timolol (TIMOPTIC) 0.25 % ophthalmic solution 1 drop, 1 drop, Both Eyes, BID, Wieting, Richard, MD, 1 drop at 02/22/21 1515    ALLERGIES   Prednisone, Propoxyphene, Celecoxib, and Ibuprofen     REVIEW OF SYSTEMS    Review of Systems:  Gen:  Denies  fever, sweats, chills weigh loss  HEENT: Denies blurred vision, double vision, ear pain, eye pain, hearing loss, nose bleeds, sore throat Cardiac:  No dizziness, chest pain or heaviness, chest tightness,edema Resp:   Denies cough or sputum porduction, shortness of breath,wheezing, hemoptysis,  Gi: Denies swallowing difficulty, stomach pain, nausea or vomiting, diarrhea, constipation, bowel incontinence Gu:  Denies bladder incontinence, burning urine Ext:   Denies Joint pain, stiffness or swelling Skin: Denies  skin rash, easy bruising or bleeding or hives Endoc:  Denies polyuria, polydipsia , polyphagia or weight change Psych:   Denies depression, insomnia or hallucinations   Other:  All other systems negative   VS: BP (!) 157/91 (BP  Location: Right Leg)   Pulse 95   Temp 98.5 F (36.9 C) (Oral)   Resp 18   Ht _0  (1.499 m)   Wt 39.9 kg   SpO2 100%   BMI 17.77 kg/m      PHYSICAL EXAM    GENERAL:NAD, no fevers, chills, no weakness no fatigue HEAD: Normocephalic, atraumatic.  EYES: Pupils equal, round, reactive to light. Extraocular muscles intact. No scleral icterus.  MOUTH: Moist mucosal membrane. Dentition intact. No abscess noted.  EAR, NOSE, THROAT: Clear without exudates. No external lesions.  NECK: Supple. No thyromegaly. No nodules. No JVD.  PULMONARY: Diffuse coarse rhonchi right sided +wheezes CARDIOVASCULAR: S1 and S2. Regular rate and rhythm. No murmurs, rubs, or gallops. No edema. Pedal pulses 2+ bilaterally.  GASTROINTESTINAL: Soft, nontender, nondistended. No masses. Positive bowel sounds. No hepatosplenomegaly.  MUSCULOSKELETAL: No swelling, clubbing, or edema. Range of motion full in all extremities.  NEUROLOGIC: Cranial nerves II through XII are intact. No gross focal neurological deficits. Sensation intact. Reflexes intact.  SKIN:  No ulceration, lesions, rashes, or cyanosis. Skin warm and dry. Turgor intact.  PSYCHIATRIC: Mood, affect within normal limits. The patient is awake, alert and oriented x 3. Insight, judgment intact.       IMAGING    CT Head Wo Contrast  Result Date: 02/20/2021 CLINICAL DATA:  Head trauma EXAM: CT HEAD WITHOUT CONTRAST TECHNIQUE: Contiguous axial images were obtained from the base of the skull through the vertex without intravenous contrast. COMPARISON:  01/12/2021 FINDINGS: Brain: No acute infarct or hemorrhage. Lateral ventricles and midline structures are stable. No acute extra-axial fluid collections. No mass effect. Vascular: No hyperdense vessel or unexpected calcification. Skull: Normal. Negative for fracture or focal lesion. Sinuses/Orbits: Minimal fluid within the right maxillary sinus. Remaining paranasal sinuses are clear. Postsurgical changes left  orbit. Other: None. IMPRESSION: 1. No acute intracranial process. 2. Minimal right maxillary sinus disease. Electronically Signed   By: Randa Ngo M.D.   On: 02/20/2021 19:04   CT Angio Chest PE W and/or Wo Contrast  Result Date: 02/20/2021 CLINICAL DATA:  PE suspected, high prob No additional relevant history provided or available. EXAM: CT ANGIOGRAPHY CHEST WITH CONTRAST TECHNIQUE: Multidetector CT imaging of the chest was performed using the standard protocol during bolus administration of intravenous contrast. Multiplanar CT image reconstructions and MIPs were obtained to evaluate the vascular anatomy. CONTRAST:  105m OMNIPAQUE IOHEXOL 350 MG/ML SOLN COMPARISON:  Most recent chest radiograph 01/12/2021. Chest CT 11/03/2017 FINDINGS: Cardiovascular: There are no filling defects within the pulmonary arteries to suggest pulmonary embolus. Mild aortic atherosclerosis and tortuosity. Cardiomegaly with primarily right heart dilatation. Mild reflux of contrast into the IVC. No pericardial effusion. Mediastinum/Nodes: 12 mm right hilar node, comparison to priors difficult due to differences in technique. No left hilar adenopathy. 13 mm prominent subcarinal node. No thyroid nodule. Patulous esophagus. Lungs/Pleura: Multifocal bronchiectasis that is progressed from prior exam. Areas of bronchial filling involving the right lower lobe. There are multifocal nodular, tree-in-bud, and consolidative airspace opacities throughout both lungs. Many of these consolidative areas are calcified or high-density. Overall interval progression from 2018 CT. Similar trace left pleural effusion. No findings of pulmonary edema. Upper Abdomen: Assessed on concurrent abdominal CT, reported separately. Musculoskeletal: Scoliosis and exaggerated thoracic kyphosis. Multilevel degenerative change in the spine. There are no acute or suspicious osseous abnormalities. Review of the MIP images confirms the above findings. IMPRESSION: 1. No  pulmonary embolus. 2. Progressive multifocal bronchiectasis with multifocal nodular, tree-in-bud, and consolidative airspace opacities throughout both lungs. Definite progression in parenchymal findings from 2018. Findings are most consistent with progression of chronic indolent infection infection, Mycobacterium avium complex. 3. Cardiomegaly with primarily right heart dilatation. Mild reflux of contrast into the IVC consistent with elevated right heart pressures. 4. Mild mediastinal and right hilar adenopathy is likely reactive. 5. Trace left pleural effusion. Aortic Atherosclerosis (ICD10-I70.0). Electronically Signed   By: MKeith RakeM.D.   On: 02/20/2021 19:14   CT Cervical Spine Wo Contrast  Result Date: 02/20/2021 CLINICAL DATA:  Neck trauma EXAM: CT CERVICAL SPINE WITHOUT CONTRAST TECHNIQUE: Multidetector CT imaging of the cervical spine was performed without intravenous contrast. Multiplanar CT image reconstructions were also generated. COMPARISON:  03/03/2011 FINDINGS: Alignment: Minimal anterolisthesis of C4 relative to C5, stable. Otherwise alignment is anatomic. Skull base and vertebrae: No acute fracture. No primary bone lesion or focal pathologic process. Soft tissues and spinal canal: No prevertebral fluid or swelling. No visible canal hematoma. Disc levels: There is mild diffuse cervical spondylosis greatest at C5-6. Prominent facet  hypertrophic changes greatest at C3-4 and C4-5. Upper chest: Airway is patent. Patchy areas of airspace disease are seen at the apices. No effusion or pneumothorax. Other: Reconstructed images demonstrate no additional findings. IMPRESSION: 1. No acute cervical spine fracture. 2. Multilevel cervical spondylosis unchanged. 3. Patchy bilateral upper lobe airspace disease. Electronically Signed   By: Randa Ngo M.D.   On: 02/20/2021 19:08   CT ABDOMEN PELVIS W CONTRAST  Result Date: 02/20/2021 CLINICAL DATA:  Abdominal distension. EXAM: CT ABDOMEN AND PELVIS  WITH CONTRAST TECHNIQUE: Multidetector CT imaging of the abdomen and pelvis was performed using the standard protocol following bolus administration of intravenous contrast. CONTRAST:  15m OMNIPAQUE IOHEXOL 350 MG/ML SOLN COMPARISON:  No prior abdominal imaging. Chest CT performed concurrently, reported separately. FINDINGS: Lower chest: Assessed on concurrent chest CT, reported separately. Hepatobiliary: Mild contrast refluxing into the hepatic veins and IVC. No focal hepatic lesion. Unremarkable gallbladder. No calcified gallstone or findings of cholecystitis. Pancreas: Parenchymal atrophy. Pancreas is partially obscured by motion. No evidence of pancreatic inflammation, ductal dilatation or mass. Spleen: Normal in size without focal abnormality. Adrenals/Urinary Tract: No adrenal nodule. No hydronephrosis or perinephric edema. Small left renal cyst. Symmetric excretion on delayed phase imaging. Urinary bladder is unremarkable. Stomach/Bowel: Bowel evaluation is limited in the absence of enteric contrast, paucity of intra-abdominal fat, and patient motion. Stomach is grossly unremarkable. There is no small bowel dilatation or evidence of obstruction. Moderate large volume of stool throughout the colon. There is stool distending the rectum. Mild stranding of the perirectal fat. Appendix not confidently visualized, no evidence of appendicitis. Vascular/Lymphatic: Aortic atherosclerosis and tortuosity. Patent portal vein. No enlarged abdominopelvic lymph nodes. Reproductive: Hysterectomy.  No evidence of adnexal mass. Other: Trace free fluid in the pelvis. No free air or focal abscess. No abdominal wall hernia. Musculoskeletal: Scoliosis and degenerative change in the spine. There are no acute or suspicious osseous abnormalities. IMPRESSION: 1. Moderate large volume of stool throughout the colon with stool distending the rectum, suggesting fecal impaction. Mild stranding of the perirectal fat. No obstruction or  perforation. 2. Trace free fluid in the pelvis is likely reactive. 3. Mild contrast refluxing into the hepatic veins and IVC, can be seen with right heart failure. Aortic Atherosclerosis (ICD10-I70.0). Electronically Signed   By: MKeith RakeM.D.   On: 02/20/2021 19:17      ASSESSMENT/PLAN   Respiratory distress with chronic pnemonia    - patient with confusion unclear if septic encephalopathy due to pna vs medication related from psych disease  - patient is unable to provide respiratory cultures due to AMS  - she is 87yo with severe cachexia and significant comorbid history, will ask palliative care to evaluate. - COVID19 negative  - supplemental O2 during my evaluation -room air - will perform infectious workup for pneumonia -Respiratory viral panel -serum fungitell -legionella ab -strep pneumoniae ur AG -Histoplasma Ur Ag -sputum resp cultures -AFB sputum expectorated specimen -reviewed pertinent imaging with patient today - ESR -AFR and aspergillus workup if patient is able    Thank you for allowing me to participate in the care of this patient.   Patient/Family are satisfied with care plan and all questions have been answered.  This document was prepared using Dragon voice recognition software and may include unintentional dictation errors.     FOttie Glazier M.D.  [facility] [place]

## 2021-02-23 NOTE — Care Plan (Signed)
PMT note:  Patient is resting in bed with no family at bedside. She does not open eyes to voice or touch. VM left for husband.

## 2021-02-23 NOTE — Progress Notes (Signed)
Patient ID: Sierra Benson, female   DOB: [birth year], 87 y.o.   MRN: [id number] [facility] PROGRESS NOTE  Sierra Benson [facility]:[id number] DOB: [birth year] DOA: 02/20/2021 PCP: Idelle Crouch, MD  HPI/Subjective: Patient had her eyes open today.  She was moving her arms today.  She was mumbling.  Objective: Vitals:   02/23/21 0751 02/23/21 1115  BP: (!) 177/99 (!) 157/91  Pulse: 98 95  Resp: 18 18  Temp: (!) 97.5 F (36.4 C) 98.5 F (36.9 C)  SpO2: 100% 100%    Intake/Output Summary (Last 24 hours) at 02/23/2021 1204 Last data filed at 02/23/2021 3536 Gross per 24 hour  Intake 2370.55 ml  Output 450 ml  Net 1920.55 ml   Filed Weights   02/20/21 1504  Weight: 39.9 kg    ROS: Review of Systems  Unable to perform ROS: Acuity of condition   Exam: Physical Exam HENT:     Head: Normocephalic.     Mouth/Throat:     Pharynx: No oropharyngeal exudate.  Eyes:     General: Lids are normal.     Conjunctiva/sclera: Conjunctivae normal.     Pupils: Pupils are equal, round, and reactive to light.  Cardiovascular:     Rate and Rhythm: Normal rate and regular rhythm.     Heart sounds: Normal heart sounds, S1 normal and S2 normal.  Pulmonary:     Breath sounds: Examination of the right-lower field reveals decreased breath sounds. Examination of the left-lower field reveals decreased breath sounds. Decreased breath sounds present. No wheezing, rhonchi or rales.  Abdominal:     Palpations: Abdomen is soft.     Tenderness: There is no abdominal tenderness.  Musculoskeletal:     Right ankle: No swelling.     Left ankle: No swelling.  Skin:    General: Skin is warm.     Findings: No rash.  Neurological:     Mental Status: She is disoriented.       Data Reviewed: Basic Metabolic Panel: Recent Labs  Lab 02/20/21 1519 02/22/21 0432 02/23/21 0438  NA 137 144 138  K 4.4 4.1 4.2  CL 109 115* 110  CO2 23 22 21*  GLUCOSE 89 108* 128*  BUN 49* 26* 17  CREATININE  1.15* 1.10* 0.94  CALCIUM 9.7 9.3 9.0   Liver Function Tests: Recent Labs  Lab 02/20/21 1519  AST 20  ALT 13  ALKPHOS 113  BILITOT 0.9  PROT 8.1  ALBUMIN 3.3*   CBC: Recent Labs  Lab 02/20/21 1519 02/22/21 0432 02/23/21 0438  WBC 12.3* 12.3* 13.1*  NEUTROABS 10.6*  --   --   HGB 11.9* 11.0* 12.1  HCT 38.1 34.6* 37.9  MCV 105.0* 105.5* 104.1*  PLT 297 281 293     Recent Results (from the past 240 hour(s))  Resp Panel by RT-PCR (Flu A&B, Covid) Nasopharyngeal Swab     Status: None   Collection Time: 02/20/21  4:39 PM   Specimen: Nasopharyngeal Swab; Nasopharyngeal(NP) swabs in vial transport medium  Result Value Ref Range Status   SARS Coronavirus 2 by RT PCR NEGATIVE NEGATIVE Final    Comment: (NOTE) SARS-CoV-2 target nucleic acids are NOT DETECTED.  The SARS-CoV-2 RNA is generally detectable in upper respiratory specimens during the acute phase of infection. The lowest concentration of SARS-CoV-2 viral copies this assay can detect is 138 copies/mL. A negative result does not preclude SARS-Cov-2 infection and should not be used as the sole basis for treatment or other patient  management decisions. A negative result may occur with  improper specimen collection/handling, submission of specimen other than nasopharyngeal swab, presence of viral mutation(s) within the areas targeted by this assay, and inadequate number of viral copies(<138 copies/mL). A negative result must be combined with clinical observations, patient history, and epidemiological information. The expected result is Negative.  Fact Sheet for Patients:  EntrepreneurPulse.com.au  Fact Sheet for Healthcare Providers:  IncredibleEmployment.be  This test is no t yet approved or cleared by the [place] FDA and  has been authorized for detection and/or diagnosis of SARS-CoV-2 by FDA under an Emergency Use Authorization (EUA). This EUA will remain  in effect  (meaning this test can be used) for the duration of the COVID-19 declaration under Section 564(b)(1) of the Act, 21 U.S.C.section 360bbb-3(b)(1), unless the authorization is terminated  or revoked sooner.       Influenza A by PCR NEGATIVE NEGATIVE Final   Influenza B by PCR NEGATIVE NEGATIVE Final    Comment: (NOTE) The Xpert Xpress SARS-CoV-2/FLU/RSV plus assay is intended as an aid in the diagnosis of influenza from Nasopharyngeal swab specimens and should not be used as a sole basis for treatment. Nasal washings and aspirates are unacceptable for Xpert Xpress SARS-CoV-2/FLU/RSV testing.  Fact Sheet for Patients: EntrepreneurPulse.com.au  Fact Sheet for Healthcare Providers: IncredibleEmployment.be  This test is not yet approved or cleared by the [place] FDA and has been authorized for detection and/or diagnosis of SARS-CoV-2 by FDA under an Emergency Use Authorization (EUA). This EUA will remain in effect (meaning this test can be used) for the duration of the COVID-19 declaration under Section 564(b)(1) of the Act, 21 U.S.C. section 360bbb-3(b)(1), unless the authorization is terminated or revoked.  Performed at [facility], [address] [place], [place] [address]       Scheduled Meds: . enoxaparin (LOVENOX) injection  30 mg Subcutaneous Q24H  . melatonin  3 mg Oral QHS  . multivitamin with minerals   Oral Daily  . polyethylene glycol  17 g Oral Daily  . timolol  1 drop Both Eyes BID   Continuous Infusions: . azithromycin Stopped (02/22/21 1720)  . cefTRIAXone (ROCEPHIN)  IV Stopped (02/22/21 1537)  . dextrose 5 % and 0.45% NaCl 50 mL/hr at 02/23/21 7902   Brief history: Past medical history of dementia, bipolar disorder.  Patient admitted with acute delirium and altered mental status.  Patient is n.p.o. and on IV fluid hydration.  Family feels that she got worse by starting on Seroquel.  She was started on  empiric antibiotics for multifocal pneumonia seen on CT scan which could be MAI.  Given IV fluids for dehydration.  For elevated lithium level lithium was held and given IV fluids. Assessment/Plan:  1. Acute delirium with history of underlying dementia.  In speaking with family, the patient has gotten worse since Seroquel was started.  I discontinued Seroquel yesterday.  Continue as needed Haldol.  Initial CT scan of the head was negative.  Urinalysis negative.  CT scan of the chest showing pneumonia which could be MAI.  Empirically started on Rocephin and Zithromax and given IV fluid hydration.  Patient did not do well with swallow evaluation today and recommends n.p.o.  B12 and RPR pending. 2. Multifocal pneumonia seen on CT scan of the chest.  Continue Rocephin and Zithromax.  Could be MAI. 3. Elevated lithium level on presentation.  Continue IV fluids and hold lithium.  Lithium level now in the therapeutic range. 4. Dehydration with chronic  kidney disease stage IIIa.  Creatinine on presentation 1.15 and came down to 0.94 today.  New IV fluids. 5. Underweight due to inadequate caloric intake.  BMI 17.77 6. Macrocytic anemia.  Vitamin B12 level pending 7. Started talking with the daughter about goals of care.  Right now the patient is a full CODE STATUS and would she want to go through CPR if her heart stops.  Also if she does not wake up enough in order to eat to keep up with her nutritional needs what to do.  Palliative care consultation.    Code Status:     Code Status Orders  (From admission, onward)         Start     Ordered   02/21/21 1453  Full code  Continuous        02/21/21 1456        Code Status History    This patient has a current code status but no historical code status.   Advance Care Planning Activity     Family Communication: Spoke with the patient's daughter Vaughan Basta on the phone.  She is in the main contact. Disposition Plan: Status is: Inpatient  Dispo: The  patient is from: Home              Anticipated d/c is to: Yet to be determined              Patient currently still not responsive enough to be placed on a diet.  Palliative care consultation.   Difficult to place patient.  Hopefully not  Time spent: 27 minutes  [place]

## 2021-02-23 NOTE — Progress Notes (Signed)
PT Cancellation Note  Patient Details Name: Sierra Benson MRN: [id number] DOB: 04/03/1929   Cancelled Treatment:    Reason Eval/Treat Not Completed: Other (comment).  Pt laying in bed upon PT arrival and would briefly open L eye (minimally) with cues.  Pt did not verbalize during attempted session.  Unable to get pt to initiate any movement or participate in therapy session.  Nurse aware of pt's current condition.  Will re-attempt PT treatment at a later date/time.  Leitha Bleak, PT 02/23/21, 12:13 PM

## 2021-02-23 NOTE — Evaluation (Signed)
Clinical/Bedside Swallow Evaluation Patient Details  Name: Sierra Benson MRN: [id number] Date of Birth: [birth year]  Today's Date: 02/23/2021 Time: SLP Start Time (ACUTE ONLY): 110 SLP Stop Time (ACUTE ONLY): 0935 SLP Time Calculation (min) (ACUTE ONLY): 20 min  Past Medical History:  Past Medical History:  Diagnosis Date  . Bacterial pneumonia 11/2015  . Cystocele   . Glaucoma   . Insomnia   . Menopausal state   . Mitral valve disorder   . Nocturia   . Prediabetes 06/19/2015  . Rectocele   . Vaginal atrophy   . Varicose veins   . Vitamin D deficiency    Past Surgical History:  Past Surgical History:  Procedure Laterality Date  . BREAST BIOPSY Right    benign nodule  . EYE SURGERY Left    cataract removed  . FOOT SURGERY    . VAGINAL HYSTERECTOMY     menorrhagia   HPI:  Sierra Benson is a 87 y.o. female with medical history significant for bipolar disorder who was brought into the emergency room by EMS for evaluation of mental status changes that have been provessive over the last couple of weeks. Her husband states that she has been awake for 2 days straight and has been wandering around the house, very irritable and agitated.  Oral intake is very poor and patient has lost about 10 pounds in the last 1 month.  She had a CT scan of the chest with contrast to rule out a PE which shows progression and parenchymal findings from 2018.  Findings are most consistent with progression of chronic indolent infection with MAI.  She is also noted to have right heart dilatation on CT scan. Patient will be admitted to the hospital for delirium and has been seen by psychiatry.   Assessment / Plan / Recommendation Clinical Impression  Pt was laying in bed with open mouth posture and her eyes open. Pt did not respond to any stimulus presented; verbal, physical repositioning, wet swab to her mouth. At this time, pt is at a very high of aspiration and continues to be appropriate for NPO.  Given pt's recent decline and current state, recommend Palliative Care/Hospice consult. ST to follow chart for any improvements. SLP Visit Diagnosis: Dysphagia, unspecified (R13.10);Cognitive communication deficit (R41.841)    Aspiration Risk  Severe aspiration risk;Risk for inadequate nutrition/hydration    Diet Recommendation NPO   Medication Administration: Via alternative means    Other  Recommendations Oral Care Recommendations: Oral care QID Other Recommendations: Have oral suction available;Remove water pitcher;Prohibited food (jello, ice cream, thin soups)   Follow up Recommendations  (Palliative Care/Hospice)      Frequency and Duration min 1 x/week  1 week       Prognosis Prognosis for Safe Diet Advancement:  (poor) Barriers to Reach Goals: Cognitive deficits;Severity of deficits;Time post onset      Swallow Study   General Date of Onset: 02/20/21 HPI: Sierra Benson is a 87 y.o. female with medical history significant for bipolar disorder who was brought into the emergency room by EMS for evaluation of mental status changes that have been provessive over the last couple of weeks. Her husband states that she has been awake for 2 days straight and has been wandering around the house, very irritable and agitated.  Oral intake is very poor and patient has lost about 10 pounds in the last 1 month.  She had a CT scan of the chest with contrast to rule out a PE  which shows progression and parenchymal findings from 2018.  Findings are most consistent with progression of chronic indolent infection with MAI.  She is also noted to have right heart dilatation on CT scan. Patient will be admitted to the hospital for delirium and has been seen by psychiatry. Type of Study: Bedside Swallow Evaluation Previous Swallow Assessment: none in chart Diet Prior to this Study: NPO Temperature Spikes Noted: No Respiratory Status: Room air History of Recent Intubation: No Behavior/Cognition:   (see impression statement) Oral Cavity Assessment: Dry Oral Care Completed by SLP: Yes Patient Positioning:  (fetal position in bed) Baseline Vocal Quality: Not observed Volitional Cough: Cognitively unable to elicit Volitional Swallow: Unable to elicit                        Damascus Feldpausch B. Rutherford Nail M.S., CCC-SLP, [place] [facility]ologist [facility] Office [phone]         Chip Canepa Rutherford Nail 02/23/2021,1:06 PM

## 2021-02-23 NOTE — Progress Notes (Signed)
*  PRELIMINARY RESULTS* Echocardiogram 2D Echocardiogram   has been performed.  Sierra Benson 02/23/2021, 10:06 AM

## 2021-02-24 DIAGNOSIS — N179 Acute kidney failure, unspecified: Secondary | ICD-10-CM

## 2021-02-24 DIAGNOSIS — Z7189 Other specified counseling: Secondary | ICD-10-CM

## 2021-02-24 DIAGNOSIS — R7989 Other specified abnormal findings of blood chemistry: Secondary | ICD-10-CM | POA: Diagnosis not present

## 2021-02-24 DIAGNOSIS — Z515 Encounter for palliative care: Secondary | ICD-10-CM

## 2021-02-24 DIAGNOSIS — N189 Chronic kidney disease, unspecified: Secondary | ICD-10-CM

## 2021-02-24 DIAGNOSIS — J189 Pneumonia, unspecified organism: Secondary | ICD-10-CM | POA: Diagnosis not present

## 2021-02-24 DIAGNOSIS — R410 Disorientation, unspecified: Secondary | ICD-10-CM | POA: Diagnosis not present

## 2021-02-24 LAB — CBC
HCT: 40.6 % (ref 36.0–46.0)
Hemoglobin: 13.1 g/dL (ref 12.0–15.0)
MCH: 33.6 pg (ref 26.0–34.0)
MCHC: 32.3 g/dL (ref 30.0–36.0)
MCV: 104.1 fL — ABNORMAL HIGH (ref 80.0–100.0)
Platelets: 260 10*3/uL (ref 150–400)
RBC: 3.9 MIL/uL (ref 3.87–5.11)
RDW: 15.2 % (ref 11.5–15.5)
WBC: 11.8 10*3/uL — ABNORMAL HIGH (ref 4.0–10.5)
nRBC: 0 % (ref 0.0–0.2)

## 2021-02-24 LAB — BASIC METABOLIC PANEL
Anion gap: 5 (ref 5–15)
BUN: 13 mg/dL (ref 8–23)
CO2: 22 mmol/L (ref 22–32)
Calcium: 8.6 mg/dL — ABNORMAL LOW (ref 8.9–10.3)
Chloride: 109 mmol/L (ref 98–111)
Creatinine, Ser: 0.69 mg/dL (ref 0.44–1.00)
GFR, Estimated: >60 mL/min (ref >60)
Glucose, Bld: 106 mg/dL — ABNORMAL HIGH (ref 70–99)
Potassium: 3.6 mmol/L (ref 3.5–5.1)
Sodium: 136 mmol/L (ref 135–145)

## 2021-02-24 LAB — LEGIONELLA PNEUMOPHILA SEROGP 1 UR AG: L. pneumophila Serogp 1 Ur Ag: NEGATIVE

## 2021-02-24 LAB — ECHOCARDIOGRAM COMPLETE
Height: 59 in
S' Lateral: 1.4 cm
Weight: 1408 oz

## 2021-02-24 LAB — RPR: RPR Ser Ql: NONREACTIVE

## 2021-02-24 NOTE — Progress Notes (Signed)
SLP Cancellation Note  Patient Details Name: Sierra Benson MRN: [id number] DOB: February 19, 1929   Cancelled treatment:       Reason Eval/Treat Not Completed:   Palliative Care meeting with pt's husband.  Will check chart for potential GOC and re-attempt if indicated.   Happi B. Rutherford Nail M.S., CCC-SLP, [place] Office [phone]   Stormy Fabian 02/24/2021, 11:48 AM

## 2021-02-24 NOTE — Care Management Important Message (Signed)
Important Message  Patient Details  Name: Sierra Benson MRN: [id number] Date of Birth: 01/07/1929   Medicare Important Message Given:  Yes     Dannette Barbara 02/24/2021, 11:23 AM

## 2021-02-24 NOTE — Consult Note (Signed)
[facility] Consult   Reason for Consult: Consult follow-up for 87 year old woman with a history of bipolar disorder brought into the hospital with altered mental state Referring Physician:  [place] Patient Identification: Sierra Benson MRN:  [id number] Principal Diagnosis: Delirium Diagnosis:  Principal Problem:   Delirium Active Problems:   Underweight due to inadequate caloric intake   Bipolar 1 disorder, depressed, moderate (HCC)   Dehydration   Multifocal pneumonia   Bronchiectasis ([place])   Dementia with behavioral disturbance (HCC)   Elevated lithium level   Stage 3a chronic kidney disease ([place])   Macrocytic anemia   Acute kidney injury superimposed on CKD ([place])   Total Time spent with patient: 45 minutes  Subjective:   Sierra Benson is a 87 y.o. female patient admitted with "I am in [facility]".  HPI: Patient seen chart reviewed.  Spoke with speech therapy and nursing.  87 year old woman who came to the emergency room the other day with acute confusion delirium and subacute poor self-care and altered mental status.  At the time I saw her in the emergency room she was basically unresponsive.  Family was very concerned about the impossibility of continuing to manage her outside the hospital or some other setting.  On interview today the patient was sitting in bed awake eyes open.  She was responsive and interactive.  She knew that she was in the hospital.  She knew the correct year but not the correct month.  She still was easily distracted and a bit confused.  Perseverated on asking where her husband was.  She was able however to take ice chips safely given to her by speech therapy and seemed to be much more alert than previously.  Did not have any clear memory of coming into the hospital.  Labs reviewed and the lithium level which had still not been completed when I last saw her on Friday turns out to have been about 1.3 at that time and is now  down to 0.5.  Past Psychiatric History: Patient has a history of bipolar disorder.  Has had long periods of stability in her life much of it on lithium.  More recently has been back on the medicine and apparently had been doing okay but possibly recently decompensating.  Risk to Self:   Risk to Others:   Prior Inpatient Therapy:   Prior Outpatient Therapy:    Past Medical History:  Past Medical History:  Diagnosis Date  . Bacterial pneumonia 11/2015  . Cystocele   . Glaucoma   . Insomnia   . Menopausal state   . Mitral valve disorder   . Nocturia   . Prediabetes 06/19/2015  . Rectocele   . Vaginal atrophy   . Varicose veins   . Vitamin D deficiency     Past Surgical History:  Procedure Laterality Date  . BREAST BIOPSY Right    benign nodule  . EYE SURGERY Left    cataract removed  . FOOT SURGERY    . VAGINAL HYSTERECTOMY     menorrhagia   Family History:  Family History  Problem Relation Age of Onset  . Diabetes Brother   . Lung cancer Brother   . Breast cancer Sister   . Ovarian cancer Neg Hx   . Colon cancer Neg Hx   . Heart disease Neg Hx    Family Psychiatric  History: See previous note Social History:  Social History   Substance and Sexual Activity  Alcohol Use No  Social History   Substance and Sexual Activity  Drug Use No    Social History   Socioeconomic History  . Marital status: Married    Spouse name: Not on file  . Number of children: Not on file  . Years of education: Not on file  . Highest education level: Not on file  Occupational History  . Not on file  Tobacco Use  . Smoking status: Former Research scientist (life sciences)  . Smokeless tobacco: Never Used  Vaping Use  . Vaping Use: Never used  Substance and Sexual Activity  . Alcohol use: No  . Drug use: No  . Sexual activity: Yes    Birth control/protection: Surgical  Other Topics Concern  . Not on file  Social History Narrative  . Not on file   Social Determinants of Health   Financial  Resource Strain: Not on file  Food Insecurity: Not on file  Transportation Needs: Not on file  Physical Activity: Not on file  Stress: Not on file  Social Connections: Not on file   Additional Social History:    Allergies:   Allergies  Allergen Reactions  . Prednisone Swelling and Other (See Comments)    Insomnia   . Propoxyphene Hives  . Celecoxib Nausea And Vomiting  . Ibuprofen Nausea And Vomiting    Labs:  Results for orders placed or performed during the hospital encounter of 02/20/21 (from the past 48 hour(s))  Lithium level     Status: Abnormal   Collection Time: 02/23/21  4:38 AM  Result Value Ref Range   Lithium Lvl 0.54 (L) 0.60 - 1.20 mmol/L    Comment: Performed at [facility], [address] [place], [place] [address]  CBC     Status: Abnormal   Collection Time: 02/23/21  4:38 AM  Result Value Ref Range   WBC 13.1 (H) 4.0 - 10.5 K/uL   RBC 3.64 (L) 3.87 - 5.11 MIL/uL   Hemoglobin 12.1 12.0 - 15.0 g/dL   HCT 37.9 36.0 - 46.0 %   MCV 104.1 (H) 80.0 - 100.0 fL   MCH 33.2 26.0 - 34.0 pg   MCHC 31.9 30.0 - 36.0 g/dL   RDW 15.3 11.5 - 15.5 %   Platelets 293 150 - 400 K/uL   nRBC 0.0 0.0 - 0.2 %    Comment: Performed at [facility], [address] [place], [place] [address]  Basic metabolic panel     Status: Abnormal   Collection Time: 02/23/21  4:38 AM  Result Value Ref Range   Sodium 138 135 - 145 mmol/L   Potassium 4.2 3.5 - 5.1 mmol/L   Chloride 110 98 - 111 mmol/L   CO2 21 (L) 22 - 32 mmol/L   Glucose, Bld 128 (H) 70 - 99 mg/dL    Comment: Glucose reference range applies only to samples taken after fasting for at least 8 hours.   BUN 17 8 - 23 mg/dL   Creatinine, Ser 0.94 0.44 - 1.00 mg/dL   Calcium 9.0 8.9 - 10.3 mg/dL   GFR, Estimated 58 (L) >60 mL/min    Comment: (NOTE) Calculated using the CKD-EPI Creatinine Equation (2021)    Anion gap 7 5 - 15    Comment: Performed at [facility], [address]  [place], [place] [address]  Vitamin B12     Status: None   Collection Time: 02/23/21  4:38 AM  Result Value Ref Range   Vitamin B-12 396 180 - 914 pg/mL  Comment: (NOTE) This assay is not validated for testing neonatal or myeloproliferative syndrome specimens for Vitamin B12 levels. Performed at [facility], [place] [address] [place], [place] [address]   RPR     Status: None   Collection Time: 02/23/21  4:38 AM  Result Value Ref Range   RPR Ser Ql NON REACTIVE NON REACTIVE    Comment: Performed at [facility], [place] [address], [place], [place] [address]  Aspergillus Ag, BAL/Serum     Status: None   Collection Time: 02/23/21  4:38 AM  Result Value Ref Range   Aspergillus Ag, BAL/Serum 0.02 0.00 - 0.49 Index    Comment: (NOTE) Performed At: [facility] [place], [place] [id number] Rush Farmer MD [id number]   CK     Status: None   Collection Time: 02/23/21  4:38 AM  Result Value Ref Range   Total CK 91 38 - 234 U/L    Comment: Performed at [facility], [address] [place], [place] [address]  Glucose, capillary     Status: Abnormal   Collection Time: 02/23/21  3:41 PM  Result Value Ref Range   Glucose-Capillary 116 (H) 70 - 99 mg/dL    Comment: Glucose reference range applies only to samples taken after fasting for at least 8 hours.   Comment 1 Notify RN   CBC     Status: Abnormal   Collection Time: 02/24/21  6:13 AM  Result Value Ref Range   WBC 11.8 (H) 4.0 - 10.5 K/uL   RBC 3.90 3.87 - 5.11 MIL/uL   Hemoglobin 13.1 12.0 - 15.0 g/dL   HCT 40.6 36.0 - 46.0 %   MCV 104.1 (H) 80.0 - 100.0 fL   MCH 33.6 26.0 - 34.0 pg   MCHC 32.3 30.0 - 36.0 g/dL   RDW 15.2 11.5 - 15.5 %   Platelets 260 150 - 400 K/uL   nRBC 0.0 0.0 - 0.2 %    Comment: Performed at [facility], [address] [place], [place] [address]  Basic metabolic panel     Status: Abnormal   Collection Time: 02/24/21  6:13 AM  Result Value Ref Range    Sodium 136 135 - 145 mmol/L   Potassium 3.6 3.5 - 5.1 mmol/L   Chloride 109 98 - 111 mmol/L   CO2 22 22 - 32 mmol/L   Glucose, Bld 106 (H) 70 - 99 mg/dL    Comment: Glucose reference range applies only to samples taken after fasting for at least 8 hours.   BUN 13 8 - 23 mg/dL   Creatinine, Ser 0.69 0.44 - 1.00 mg/dL   Calcium 8.6 (L) 8.9 - 10.3 mg/dL   GFR, Estimated >60 >60 mL/min    Comment: (NOTE) Calculated using the CKD-EPI Creatinine Equation (2021)    Anion gap 5 5 - 15    Comment: Performed at [facility], [address], [place], [place] [address]    Current Facility-Administered Medications  Medication Dose Route Frequency Provider Last Rate Last Admin  . acetaminophen (TYLENOL) tablet 650 mg  650 mg Oral Q6H PRN Agbata, Tochukwu, MD       Or  . acetaminophen (TYLENOL) suppository 650 mg  650 mg Rectal Q6H PRN Agbata, Tochukwu, MD      . azithromycin (ZITHROMAX) 500 mg in sodium chloride 0.9 % 250 mL IVPB  500 mg Intravenous Q24H Loletha Grayer, MD 250 mL/hr at 02/24/21 1538 500 mg at 02/24/21  1538  . bisacodyl (DULCOLAX) suppository 10 mg  10 mg Rectal Daily Loletha Grayer, MD   10 mg at 02/24/21 1025  . cefTRIAXone (ROCEPHIN) 2 g in sodium chloride 0.9 % 100 mL IVPB  2 g Intravenous Q24H Loletha Grayer, MD 200 mL/hr at 02/24/21 1431 2 g at 02/24/21 1431  . dextrose 5 %-0.45 % sodium chloride infusion   Intravenous Continuous Loletha Grayer, MD 70 mL/hr at 02/24/21 1653 New Bag at 02/24/21 1653  . enoxaparin (LOVENOX) injection 30 mg  30 mg Subcutaneous Q24H Agbata, Tochukwu, MD   30 mg at 02/23/21 2032  . haloperidol lactate (HALDOL) injection 1 mg  1 mg Intramuscular Q8H PRN Salley Scarlet, MD   1 mg at 02/22/21 0252  . melatonin tablet 3 mg  3 mg Oral QHS Salley Scarlet, MD      . multivitamin with minerals tablet   Oral Daily Agbata, Tochukwu, MD      . ondansetron (ZOFRAN) tablet 4 mg  4 mg Oral Q6H PRN Agbata, Tochukwu, MD       Or  .  ondansetron (ZOFRAN) injection 4 mg  4 mg Intravenous Q6H PRN Agbata, Tochukwu, MD      . polyethylene glycol (MIRALAX / GLYCOLAX) packet 17 g  17 g Oral Daily Vanessa [place], MD      . timolol (TIMOPTIC) 0.25 % ophthalmic solution 1 drop  1 drop Both Eyes BID Loletha Grayer, MD   1 drop at 02/24/21 1025    Musculoskeletal: Strength & Muscle Tone: decreased Gait & Station: unable to stand Patient leans: N/A  Psychiatric Specialty Exam: Physical Exam Vitals and nursing note reviewed.  Constitutional:      Appearance: She is well-developed and well-nourished. She is ill-appearing.  HENT:     Head: Normocephalic and atraumatic.  Eyes:     Conjunctiva/sclera: Conjunctivae normal.     Pupils: Pupils are equal, round, and reactive to light.  Cardiovascular:     Heart sounds: Normal heart sounds.  Pulmonary:     Effort: Pulmonary effort is normal.  Abdominal:     Palpations: Abdomen is soft.  Musculoskeletal:        General: Normal range of motion.     Cervical back: Normal range of motion.  Skin:    General: Skin is warm and dry.  Neurological:     Mental Status: She is alert.  Psychiatric:        Attention and Perception: Attention normal.        Mood and Affect: Mood is anxious. Affect is flat.        Speech: Speech is delayed.        Behavior: Behavior is slowed.        Thought Content: Thought content is not paranoid. Thought content does not include homicidal or suicidal ideation.        Cognition and Memory: Cognition is impaired. Memory is impaired.     Review of Systems  Constitutional: Negative.   HENT: Negative.   Eyes: Negative.   Respiratory: Negative.   Cardiovascular: Negative.   Gastrointestinal: Negative.   Musculoskeletal: Negative.   Skin: Negative.   Neurological: Negative.   Psychiatric/Behavioral: Positive for confusion. Negative for suicidal ideas.    Blood pressure 140/71, pulse 71, temperature 97.7 F (36.5 C), temperature source Axillary,  resp. rate (!) 23, height 4\' 11"  (1.499 m), weight 39.9 kg, SpO2 96 %.Body mass index is 17.77 kg/m.  General Appearance: Casual  Eye Contact:  Fair  Speech:  Slow  Volume:  Decreased  Mood:  Anxious  Affect:  Constricted  Thought Process:  Disorganized  Orientation:  Other:  See note above.  Thought Content:  Rumination  Suicidal Thoughts:  No  Homicidal Thoughts:  No  Memory:  Immediate;   Fair Recent;   Poor Remote;   Fair  Judgement:  Impaired  Insight:  Shallow  Psychomotor Activity:  Decreased  Concentration:  Concentration: Poor  Recall:  Poor  Fund of Knowledge:  Fair  Language:  Fair  Akathisia:  No  Handed:  Right  AIMS (if indicated):     Assets:  Desire for Improvement Housing  ADL's:  Impaired  Cognition:  Impaired,  Mild and Moderate  Sleep:        Treatment Plan Summary: Plan Patient seen chart reviewed.  From my point of view the patient is greatly improved today from where she was on Friday.  Medically improved.  Hydration and nutrition probably improved.  Almost certainly some of the confusion was also from her lithium level being up.  Although 1.3 is not an enormous level for someone this frail and elderly it is concerning and was probably an elevation because of dehydration.  I would recommend for now continuing to hold off on lithium.  I suspect getting her lithium level down has been crucial to getting her back into lucidity and out of this delirium.  We can refollow-up with necessary treatment once she is fully medically stable.  Disposition: No evidence of imminent risk to self or others at present.   Patient does not meet criteria for psychiatric inpatient admission. Supportive therapy provided about ongoing stressors.  Alethia Berthold, MD 02/24/2021 4:54 PM

## 2021-02-24 NOTE — Progress Notes (Signed)
Pulmonary Medicine          Date: 02/24/2021,   MRN# [id number] Sierra Benson 06/23/1929     AdmissionWeight: 39.9 kg                 CurrentWeight: 39.9 kg   Referring physician: Dr. Francine Benson   CHIEF COMPLAINT:   Progressive multifocal pneumonia with bronchiectasis mediastinal and hilar adenopathy   HISTORY OF PRESENT ILLNESS   This is a pleasant 87 year old female with a history of psychiatric disorder including bipolar, came in for confusion with altered mental status, husband states she has had poor p.o. nutrition and weight loss over the last 1 month has been on her normal antidepression medications,Labs show sodium 137, potassium 4.4, chloride 109, bicarb 23, glucose 89, BUN 49, creatinine 1.15, calcium 9.7, alkaline phosphatase 113, albumin 3.3, AST 20, ALT 13, total protein 8.1, total CK 8, procalcitonin 0.14, white count 12.3, hemoglobin 11.9, hematocrit 38.1, MCV 105, RDW 15.4, platelet count 297, lithium 1.32, TSH 2.5, T4 1.08 CT scan of abdomen and pelvis shows moderate large volume of stool throughout the colon with stool distending the rectum, suggesting fecal impaction. Mild stranding of the perirectal fat. No obstruction or perforation. Trace free fluid in the pelvis is likely reactive. Mild contrast refluxing into the hepatic veins and IVC, can be seen with right heart failure. CT angiogram of the chest shows no pulmonary embolus. Progressive multifocal bronchiectasis with multifocal nodular, tree-in-bud, and consolidative airspace opacities throughout both lungs. Definite progression in parenchymal findings from 2018. Findings are most consistent with progression of chronic indolent  infection, Mycobacterium avium complex. Cardiomegaly with primarily right heart dilatation. Mild reflux of contrast into the IVC consistent with elevated right heart pressures. Mild mediastinal and right hilar adenopathy is likely reactive. Trace left pleural effusion. Cervical  spine CT shows no acute cervical spine fracture. Multilevel cervical spondylosis unchanged. Patchy bilateral upper lobe airspace disease. CT scan of the head without contrast shows no acute intracranial process.  PCCM consultation for additional evaluation managed    02/23/21- patient is stable, she is only on 1L/min Sierra Benson.  Mentation remains an issue with mostly sleeping but opens eyes to verbal communication. Infectious workup in process.   02/24/21- patient opening eyes spontaneously and mumbles words which is improvement from prior. WBC count trending down.  Presumably AMS due to pscyhiatric meds  PAST MEDICAL HISTORY   Past Medical History:  Diagnosis Date  . Bacterial pneumonia 11/2015  . Cystocele   . Glaucoma   . Insomnia   . Menopausal state   . Mitral valve disorder   . Nocturia   . Prediabetes 06/19/2015  . Rectocele   . Vaginal atrophy   . Varicose veins   . Vitamin D deficiency      SURGICAL HISTORY   Past Surgical History:  Procedure Laterality Date  . BREAST BIOPSY Right    benign nodule  . EYE SURGERY Left    cataract removed  . FOOT SURGERY    . VAGINAL HYSTERECTOMY     menorrhagia     FAMILY HISTORY   Family History  Problem Relation Age of Onset  . Diabetes Brother   . Lung cancer Brother   . Breast cancer Sister   . Ovarian cancer Neg Hx   . Colon cancer Neg Hx   . Heart disease Neg Hx      SOCIAL HISTORY   Social History   Tobacco Use  . Smoking status: Former Research scientist (life sciences)  .  Smokeless tobacco: Never Used  Vaping Use  . Vaping Use: Never used  Substance Use Topics  . Alcohol use: No  . Drug use: No     MEDICATIONS    Home Medication:    Current Medication:  Current Facility-Administered Medications:  .  acetaminophen (TYLENOL) tablet 650 mg, 650 mg, Oral, Q6H PRN **OR** acetaminophen (TYLENOL) suppository 650 mg, 650 mg, Rectal, Q6H PRN, Agbata, Tochukwu, MD .  azithromycin (ZITHROMAX) 500 mg in sodium chloride 0.9 % 250 mL IVPB,  500 mg, Intravenous, Q24H, Agbata, Tochukwu, MD, Last Rate: 250 mL/hr at 02/23/21 1543, 500 mg at 02/23/21 1543 .  bisacodyl (DULCOLAX) suppository 10 mg, 10 mg, Rectal, Daily, Sierra Peer, Richard, MD, 10 mg at 02/24/21 1025 .  cefTRIAXone (ROCEPHIN) 2 g in sodium chloride 0.9 % 100 mL IVPB, 2 g, Intravenous, Q24H, Wieting, Richard, MD, Last Rate: 200 mL/hr at 02/23/21 1408, 2 g at 02/23/21 1408 .  dextrose 5 %-0.45 % sodium chloride infusion, , Intravenous, Continuous, Wieting, Richard, MD, Last Rate: 70 mL/hr at 02/24/21 0322, New Bag at 02/24/21 0322 .  enoxaparin (LOVENOX) injection 30 mg, 30 mg, Subcutaneous, Q24H, Agbata, Tochukwu, MD, 30 mg at 02/23/21 2032 .  haloperidol lactate (HALDOL) injection 1 mg, 1 mg, Intramuscular, Q8H PRN, Sierra Scar[place], MD, 1 mg at 02/22/21 0252 .  melatonin tablet 3 mg, 3 mg, Oral, QHS, Sierra Scarlet, MD .  multivitamin with minerals tablet, , Oral, Daily, Agbata, Tochukwu, MD .  ondansetron (ZOFRAN) tablet 4 mg, 4 mg, Oral, Q6H PRN **OR** ondansetron (ZOFRAN) injection 4 mg, 4 mg, Intravenous, Q6H PRN, Agbata, Tochukwu, MD .  polyethylene glycol (MIRALAX / GLYCOLAX) packet 17 g, 17 g, Oral, Daily, [place], MD .  timolol (TIMOPTIC) 0.25 % ophthalmic solution 1 drop, 1 drop, Both Eyes, BID, Wieting, Richard, MD, 1 drop at 02/24/21 1025    ALLERGIES   Prednisone, Propoxyphene, Celecoxib, and Ibuprofen     REVIEW OF SYSTEMS    Review of Systems:  Gen:  Denies  fever, sweats, chills weigh loss  HEENT: Denies blurred vision, double vision, ear pain, eye pain, hearing loss, nose bleeds, sore throat Cardiac:  No dizziness, chest pain or heaviness, chest tightness,edema Resp:   Denies cough or sputum porduction, shortness of breath,wheezing, hemoptysis,  Gi: Denies swallowing difficulty, stomach pain, nausea or vomiting, diarrhea, constipation, bowel incontinence Gu:  Denies bladder incontinence, burning urine Ext:   Denies Joint pain, stiffness  or swelling Skin: Denies  skin rash, easy bruising or bleeding or hives Endoc:  Denies polyuria, polydipsia , polyphagia or weight change Psych:   Denies depression, insomnia or hallucinations   Other:  All other systems negative   VS: BP (!) 169/89 (BP Location: Right Leg)   Pulse 78   Temp 98.1 F (36.7 C) (Oral)   Resp 18   Ht _0  (1.499 m)   Wt 39.9 kg   SpO2 98%   BMI 17.77 kg/m      PHYSICAL EXAM    GENERAL:NAD, no fevers, chills, no weakness no fatigue HEAD: Normocephalic, atraumatic.  EYES: Pupils equal, round, reactive to light. Extraocular muscles intact. No scleral icterus.  MOUTH: Moist mucosal membrane. Dentition intact. No abscess noted.  EAR, NOSE, THROAT: Clear without exudates. No external lesions.  NECK: Supple. No thyromegaly. No nodules. No JVD.  PULMONARY: Diffuse coarse rhonchi right sided +wheezes CARDIOVASCULAR: S1 and S2. Regular rate and rhythm. No murmurs, rubs, or gallops. No edema. Pedal pulses 2+ bilaterally.  GASTROINTESTINAL: Soft, nontender, nondistended. No masses. Positive bowel sounds. No hepatosplenomegaly.  MUSCULOSKELETAL: No swelling, clubbing, or edema. Range of motion full in all extremities.  NEUROLOGIC: Cranial nerves II through XII are intact. No gross focal neurological deficits. Sensation intact. Reflexes intact.  SKIN: No ulceration, lesions, rashes, or cyanosis. Skin warm and dry. Turgor intact.  PSYCHIATRIC: Mood, affect within normal limits. The patient is awake, alert and oriented x 3. Insight, judgment intact.       IMAGING    CT Head Wo Contrast  Result Date: 02/20/2021 CLINICAL DATA:  Head trauma EXAM: CT HEAD WITHOUT CONTRAST TECHNIQUE: Contiguous axial images were obtained from the base of the skull through the vertex without intravenous contrast. COMPARISON:  01/12/2021 FINDINGS: Brain: No acute infarct or hemorrhage. Lateral ventricles and midline structures are stable. No acute extra-axial fluid collections.  No mass effect. Vascular: No hyperdense vessel or unexpected calcification. Skull: Normal. Negative for fracture or focal lesion. Sinuses/Orbits: Minimal fluid within the right maxillary sinus. Remaining paranasal sinuses are clear. Postsurgical changes left orbit. Other: None. IMPRESSION: 1. No acute intracranial process. 2. Minimal right maxillary sinus disease. Electronically Signed   By: Randa Ngo M.D.   On: 02/20/2021 19:04   CT Angio Chest PE W and/or Wo Contrast  Result Date: 02/20/2021 CLINICAL DATA:  PE suspected, high prob No additional relevant history provided or available. EXAM: CT ANGIOGRAPHY CHEST WITH CONTRAST TECHNIQUE: Multidetector CT imaging of the chest was performed using the standard protocol during bolus administration of intravenous contrast. Multiplanar CT image reconstructions and MIPs were obtained to evaluate the vascular anatomy. CONTRAST:  59m OMNIPAQUE IOHEXOL 350 MG/ML SOLN COMPARISON:  Most recent chest radiograph 01/12/2021. Chest CT 11/03/2017 FINDINGS: Cardiovascular: There are no filling defects within the pulmonary arteries to suggest pulmonary embolus. Mild aortic atherosclerosis and tortuosity. Cardiomegaly with primarily right heart dilatation. Mild reflux of contrast into the IVC. No pericardial effusion. Mediastinum/Nodes: 12 mm right hilar node, comparison to priors difficult due to differences in technique. No left hilar adenopathy. 13 mm prominent subcarinal node. No thyroid nodule. Patulous esophagus. Lungs/Pleura: Multifocal bronchiectasis that is progressed from prior exam. Areas of bronchial filling involving the right lower lobe. There are multifocal nodular, tree-in-bud, and consolidative airspace opacities throughout both lungs. Many of these consolidative areas are calcified or high-density. Overall interval progression from 2018 CT. Similar trace left pleural effusion. No findings of pulmonary edema. Upper Abdomen: Assessed on concurrent abdominal CT,  reported separately. Musculoskeletal: Scoliosis and exaggerated thoracic kyphosis. Multilevel degenerative change in the spine. There are no acute or suspicious osseous abnormalities. Review of the MIP images confirms the above findings. IMPRESSION: 1. No pulmonary embolus. 2. Progressive multifocal bronchiectasis with multifocal nodular, tree-in-bud, and consolidative airspace opacities throughout both lungs. Definite progression in parenchymal findings from 2018. Findings are most consistent with progression of chronic indolent infection infection, Mycobacterium avium complex. 3. Cardiomegaly with primarily right heart dilatation. Mild reflux of contrast into the IVC consistent with elevated right heart pressures. 4. Mild mediastinal and right hilar adenopathy is likely reactive. 5. Trace left pleural effusion. Aortic Atherosclerosis (ICD10-I70.0). Electronically Signed   By: MKeith RakeM.D.   On: 02/20/2021 19:14   CT Cervical Spine Wo Contrast  Result Date: 02/20/2021 CLINICAL DATA:  Neck trauma EXAM: CT CERVICAL SPINE WITHOUT CONTRAST TECHNIQUE: Multidetector CT imaging of the cervical spine was performed without intravenous contrast. Multiplanar CT image reconstructions were also generated. COMPARISON:  03/03/2011 FINDINGS: Alignment: Minimal anterolisthesis of C4 relative to C5, stable. Otherwise  alignment is anatomic. Skull base and vertebrae: No acute fracture. No primary bone lesion or focal pathologic process. Soft tissues and spinal canal: No prevertebral fluid or swelling. No visible canal hematoma. Disc levels: There is mild diffuse cervical spondylosis greatest at C5-6. Prominent facet hypertrophic changes greatest at C3-4 and C4-5. Upper chest: Airway is patent. Patchy areas of airspace disease are seen at the apices. No effusion or pneumothorax. Other: Reconstructed images demonstrate no additional findings. IMPRESSION: 1. No acute cervical spine fracture. 2. Multilevel cervical spondylosis  unchanged. 3. Patchy bilateral upper lobe airspace disease. Electronically Signed   By: Randa Ngo M.D.   On: 02/20/2021 19:08   CT ABDOMEN PELVIS W CONTRAST  Result Date: 02/20/2021 CLINICAL DATA:  Abdominal distension. EXAM: CT ABDOMEN AND PELVIS WITH CONTRAST TECHNIQUE: Multidetector CT imaging of the abdomen and pelvis was performed using the standard protocol following bolus administration of intravenous contrast. CONTRAST:  81m OMNIPAQUE IOHEXOL 350 MG/ML SOLN COMPARISON:  No prior abdominal imaging. Chest CT performed concurrently, reported separately. FINDINGS: Lower chest: Assessed on concurrent chest CT, reported separately. Hepatobiliary: Mild contrast refluxing into the hepatic veins and IVC. No focal hepatic lesion. Unremarkable gallbladder. No calcified gallstone or findings of cholecystitis. Pancreas: Parenchymal atrophy. Pancreas is partially obscured by motion. No evidence of pancreatic inflammation, ductal dilatation or mass. Spleen: Normal in size without focal abnormality. Adrenals/Urinary Tract: No adrenal nodule. No hydronephrosis or perinephric edema. Small left renal cyst. Symmetric excretion on delayed phase imaging. Urinary bladder is unremarkable. Stomach/Bowel: Bowel evaluation is limited in the absence of enteric contrast, paucity of intra-abdominal fat, and patient motion. Stomach is grossly unremarkable. There is no small bowel dilatation or evidence of obstruction. Moderate large volume of stool throughout the colon. There is stool distending the rectum. Mild stranding of the perirectal fat. Appendix not confidently visualized, no evidence of appendicitis. Vascular/Lymphatic: Aortic atherosclerosis and tortuosity. Patent portal vein. No enlarged abdominopelvic lymph nodes. Reproductive: Hysterectomy.  No evidence of adnexal mass. Other: Trace free fluid in the pelvis. No free air or focal abscess. No abdominal wall hernia. Musculoskeletal: Scoliosis and degenerative change in  the spine. There are no acute or suspicious osseous abnormalities. IMPRESSION: 1. Moderate large volume of stool throughout the colon with stool distending the rectum, suggesting fecal impaction. Mild stranding of the perirectal fat. No obstruction or perforation. 2. Trace free fluid in the pelvis is likely reactive. 3. Mild contrast refluxing into the hepatic veins and IVC, can be seen with right heart failure. Aortic Atherosclerosis (ICD10-I70.0). Electronically Signed   By: MKeith RakeM.D.   On: 02/20/2021 19:17      ASSESSMENT/PLAN   Respiratory distress with chronic pnemonia    - patient with confusion unclear if septic encephalopathy due to pna vs medication related from psych disease  - patient is unable to provide respiratory cultures due to AMS  - she is 87yo with severe cachexia and significant comorbid history, will ask palliative care to evaluate. - COVID19 negative  - supplemental O2 during my evaluation -room air - will perform infectious workup for pneumonia -Respiratory viral panel -serum fungitell -legionella ab -strep pneumoniae ur AG -Histoplasma Ur Ag -sputum resp cultures -AFB sputum expectorated specimen -reviewed pertinent imaging with patient today - ESR -AFR and aspergillus workup if patient is able    Thank you for allowing me to participate in the care of this patient.   Patient/Family are satisfied with care plan and all questions have been answered.  This document was prepared using  [employer] education [employer] and may include unintentional dictation errors.     Ottie Glazier, M.D.  Division of [place]

## 2021-02-24 NOTE — Progress Notes (Signed)
  Speech Language Pathology Treatment: Dysphagia  Patient Details Name: Sierra Benson MRN: [id number] DOB: [birth year] Today's Date: 02/24/2021 Time: 1025-8527 SLP Time Calculation (min) (ACUTE ONLY): 22 min  Assessment / Plan / Recommendation Clinical Impression  Pt was awake, alert and spoke when spoken too. SLP provided skilled observation of pt consuming thin liquids via cup, nectar thick liquids via cup and puree. When consuming ice chips, pt was free of any overt s/s of aspiraiton at bedside. However when performing hand over hand support for cup sips of thin liquids, pt had immediate cough x 2 possible suggestive of delayed swallow initiation. However when consuming nectar thick liquids via cup, pt was free of any overt s/s of aspiration at bedside. While silent aspiration cannot be ruled out at bedside, recommend nectar thick liquids at this time. Pt had decreased awareness of bolus of applesauce as evidenced by applesauce on her lips and delayed response to swallowing the bolus. However, pt cleared all of the bolus with each swallow.   At this time recommend dysphagia 1 with nectar thick liquids via cup, medicine crushed in puree. Pt will require complete assistance for feeding. Pt continues to be at a high aspiration risk as well as dehydration and malnutrition.     HPI HPI: Sierra Benson is a 87 y.o. female with medical history significant for bipolar disorder who was brought into the emergency room by EMS for evaluation of mental status changes that have been provessive over the last couple of weeks. Her husband states that she has been awake for 2 days straight and has been wandering around the house, very irritable and agitated.  Oral intake is very poor and patient has lost about 10 pounds in the last 1 month.  She had a CT scan of the chest with contrast to rule out a PE which shows progression and parenchymal findings from 2018.  Findings are most consistent with progression of  chronic indolent infection with MAI.  She is also noted to have right heart dilatation on CT scan. Patient will be admitted to the hospital for delirium and has been seen by psychiatry.      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 Rutherford Nail M.S., CCC-SLP, [place] Office [phone]   Stormy Fabian 02/24/2021, 4:49 PM

## 2021-02-24 NOTE — Progress Notes (Signed)
   02/21/21 1952  Assess: MEWS Score  Temp 98.1 F (36.7 C)  BP (!) 155/99  Pulse Rate (!) 103  Resp 19  SpO2 96 %  O2 Device Room Air  Assess: MEWS Score  MEWS Temp 0  MEWS Systolic 0  MEWS Pulse 1  MEWS RR 0  MEWS LOC 1  MEWS Score 2  MEWS Score Color Yellow  Assess: if the MEWS score is Yellow or Red  Were vital signs taken at a resting state? Yes  Focused Assessment Change from prior assessment (see assessment flowsheet)  Early Detection of Sepsis Score *See Row Information* Low  MEWS guidelines implemented *See Row Information* Yes  Treat  MEWS Interventions Other (Comment) (did not administer any meds)  Pain Scale 0-10  Pain Score 0  Take Vital Signs  Increase Vital Sign Frequency  Yellow: Q 2hr X 2 then Q 4hr X 2, if remains yellow, continue Q 4hrs  Escalate  MEWS: Escalate Yellow: discuss with charge nurse/RN and consider discussing with provider and RRT  Notify: Charge Nurse/RN  Name of Charge Nurse/RN Notified Mammie Russian  Date Charge Nurse/RN Notified 02/21/21  Time Charge Nurse/RN Notified 2100  Notify: Provider  Provider Name/Title N/A  Notify: Rapid Response  Name of Rapid Response RN Notified N/A  Document  Patient Outcome Other (Comment) (Stable)  Inserted for Gillermina Hu RN

## 2021-02-24 NOTE — Progress Notes (Addendum)
Physical Therapy Treatment Patient Details Name: Sierra Benson MRN: [id number] DOB: 04/04/1929 Today's Date: 02/24/2021    History of Present Illness Pt is an 87 y.o. female presenting to hospital 3/4 with weakness, not sleeping for multiple nights, not eating/drinking, multiple falls, pain all over; family unable to take care of pt at home in current condition.  Per chart this is pt's 3rd visit for similar concerns and pt has been progressively declining.  PMH includes insomnia, mitral valve disorder, h/o bipolar 1 disorder, hemorrhage of anus and rectum, h/o palpitations, h/o eye sx, h/o foot sx.    PT Comments    Pt sleeping in bed; environment adjusted to help stimulate patient (lights on, HOB raised) and pt able to open eyes and look at PT. Pt with limited ability to follow commands, but was able to visually track PT for majority of session, as well as look around at sitter with PT pointing, time, and cueing. Unable to stick her tongue out, squeeze hands, etc. Pt verbalized "no" once, but remainder of speech attempts very garbled/nonsensical.    Supine to sit maxA, and with time and assist to position to midline, pt progressed to sitting with supervision/CGA without UE ~20minutes. Sit <> stand with RW and totalA1-2 attempted twice; first attempt totalA, pt unable/unwilling to weight bear through LEs. Second attempt with totalAx2 for safety, pt unable to hold onto walker when hand placed, but once up in standing grabbed RW tightly, did not let go with cueing. Returned to supine totalA and repositioned with bed in chair position. The patient would benefit from further skilled PT intervention to progress towards goals as able, recommendation remains appropriate.     Follow Up Recommendations  SNF     Equipment Recommendations  Rolling walker with 5" wheels;3in1 (PT);Wheelchair (measurements PT);Wheelchair cushion (measurements PT)    Recommendations for Other Services OT consult      Precautions / Restrictions Precautions Precautions: Fall Restrictions Weight Bearing Restrictions: No    Mobility  Bed Mobility   Bed Mobility: Supine to Sit;Sit to Supine     Supine to sit: Max assist;HOB elevated Sit to supine: Total assist   General bed mobility comments: two person assist to reposition in bed; left with bed in chair position    Transfers Overall transfer level: Needs assistance Equipment used: Rolling walker (2 wheeled) Transfers: Sit to/from Stand Sit to Stand: Total assist;+2 safety/equipment         General transfer comment: attempted twice; first attempt totalA, pt unable/unwilling to weight bear through LEs. Second attempt with totalAx2 for safety, pt unable to hold onto walker when hand placed, but oncce up in standing grab RW tightly, did not let go with cueing.  Ambulation/Gait             General Gait Details: unable to attempt at this time   Stairs             Wheelchair Mobility    Modified Rankin (Stroke Patients Only)       Balance Overall balance assessment: Needs assistance Sitting-balance support: No upper extremity supported Sitting balance-Leahy Scale: Fair Sitting balance - Comments: no UE support to maintain midline positioning in sitting; able to sit ~82minutes with supervision/CGA.   Standing balance support: Bilateral upper extremity supported Standing balance-Leahy Scale: Zero                              Cognition Arousal/Alertness: Awake/alert Behavior During  Therapy: Flat affect Overall Cognitive Status: No family/caregiver present to determine baseline cognitive functioning                                 General Comments: Pt able to track PT visually, attend to sitter in room with PT pointing to her; unable to follow 1 step commands at this time such as squeezing PT hands, grabbing walker, sticking tongue out, etc.      Exercises      General Comments         Pertinent Vitals/Pain Pain Assessment: Faces Faces Pain Scale: No hurt    Home Living                      Prior Function            PT Goals (current goals can now be found in the care plan section) Progress towards PT goals:  (slowly)    Frequency    Min 2X/week      PT Plan Current plan remains appropriate    Co-evaluation              AM-PAC PT "6 Clicks" Mobility   Outcome Measure  Help needed turning from your back to your side while in a flat bed without using bedrails?: Total Help needed moving from lying on your back to sitting on the side of a flat bed without using bedrails?: Total Help needed moving to and from a bed to a chair (including a wheelchair)?: Total Help needed standing up from a chair using your arms (e.g., wheelchair or bedside chair)?: Total Help needed to walk in hospital room?: Total Help needed climbing 3-5 steps with a railing? : Total 6 Click Score: 6    End of Session Equipment Utilized During Treatment: Gait belt Activity Tolerance: Patient limited by fatigue;Other (comment) (cognitive status) Patient left: in bed;with call bell/phone within reach;with nursing/sitter in room;Other (comment) Nurse Communication: Mobility status;Precautions;Other (comment) PT Visit Diagnosis: Other abnormalities of gait and mobility (R26.89);Unsteadiness on feet (R26.81);Muscle weakness (generalized) (M62.81);History of falling (Z91.81);Difficulty in walking, not elsewhere classified (R26.2);Other symptoms and signs involving the nervous system (R29.898)     Time: 1000-1015 PT Time Calculation (min) (ACUTE ONLY): 15 min  Charges:  $Therapeutic Activity: 8-22 mins                     Lieutenant Diego PT, DPT 11:03 AM,02/24/21

## 2021-02-24 NOTE — Progress Notes (Signed)
Patient ID: Sierra Benson, female   DOB: 11/12/1929, 87 y.o.   MRN: [id number] [facility] PROGRESS NOTE  CHERLYNN POPIEL WEX:[id number] DOB: 15-Nov-1929 DOA: 02/20/2021 PCP: Idelle Crouch, MD  HPI/Subjective: Patient did answer a few yes or no questions today.  When I lifted up her arm she was able to hold them up for a second before coming down on the bed.  Admitted 3/5 with change in mental status.  Objective: Vitals:   02/24/21 0855 02/24/21 1246  BP: (!) 154/64 (!) 169/89  Pulse: 99 78  Resp: 16 18  Temp: 98.7 F (37.1 C) 98.1 F (36.7 C)  SpO2: 93% 98%    Intake/Output Summary (Last 24 hours) at 02/24/2021 1330 Last data filed at 02/24/2021 9381 Gross per 24 hour  Intake 989.02 ml  Output 900 ml  Net 89.02 ml   Filed Weights   02/20/21 1504  Weight: 39.9 kg    ROS: Review of Systems  Unable to perform ROS: Acuity of condition  Cardiovascular: Negative for chest pain.  Gastrointestinal: Negative for abdominal pain.   Exam: Physical Exam HENT:     Head: Normocephalic.     Mouth/Throat:     Comments: Mouth dry Eyes:     General: Lids are normal.     Conjunctiva/sclera: Conjunctivae normal.     Pupils: Pupils are equal, round, and reactive to light.  Cardiovascular:     Rate and Rhythm: Normal rate and regular rhythm.     Heart sounds: Normal heart sounds, S1 normal and S2 normal.  Pulmonary:     Breath sounds: Examination of the right-lower field reveals decreased breath sounds. Examination of the left-lower field reveals decreased breath sounds. Decreased breath sounds present. No wheezing, rhonchi or rales.  Abdominal:     Palpations: Abdomen is soft.     Tenderness: There is no abdominal tenderness.  Musculoskeletal:     Right ankle: No swelling.     Left ankle: No swelling.  Skin:    General: Skin is warm.     Findings: No rash.  Neurological:     Comments: Patient had her eyes open and answers a few yes or no questions.  Able to hold her  hands up off the bed for second before slowly coming down to the bed.       Data Reviewed: Basic Metabolic Panel: Recent Labs  Lab 02/20/21 1519 02/22/21 0432 02/23/21 0438 02/24/21 0613  NA 137 144 138 136  K 4.4 4.1 4.2 3.6  CL 109 115* 110 109  CO2 23 22 21* 22  GLUCOSE 89 108* 128* 106*  BUN 49* 26* 17 13  CREATININE 1.15* 1.10* 0.94 0.69  CALCIUM 9.7 9.3 9.0 8.6*   Liver Function Tests: Recent Labs  Lab 02/20/21 1519  AST 20  ALT 13  ALKPHOS 113  BILITOT 0.9  PROT 8.1  ALBUMIN 3.3*   CBC: Recent Labs  Lab 02/20/21 1519 02/22/21 0432 02/23/21 0438 02/24/21 0613  WBC 12.3* 12.3* 13.1* 11.8*  NEUTROABS 10.6*  --   --   --   HGB 11.9* 11.0* 12.1 13.1  HCT 38.1 34.6* 37.9 40.6  MCV 105.0* 105.5* 104.1* 104.1*  PLT 297 281 293 260   Cardiac Enzymes: Recent Labs  Lab 02/23/21 0438  CKTOTAL 91   CBG: Recent Labs  Lab 02/23/21 1541  GLUCAP 116*    Recent Results (from the past 240 hour(s))  Resp Panel by RT-PCR (Flu A&B, Covid) Nasopharyngeal Swab  Status: None   Collection Time: 02/20/21  4:39 PM   Specimen: Nasopharyngeal Swab; Nasopharyngeal(NP) swabs in vial transport medium  Result Value Ref Range Status   SARS Coronavirus 2 by RT PCR NEGATIVE NEGATIVE Final    Comment: (NOTE) SARS-CoV-2 target nucleic acids are NOT DETECTED.  The SARS-CoV-2 RNA is generally detectable in upper respiratory specimens during the acute phase of infection. The lowest concentration of SARS-CoV-2 viral copies this assay can detect is 138 copies/mL. A negative result does not preclude SARS-Cov-2 infection and should not be used as the sole basis for treatment or other patient management decisions. A negative result may occur with  improper specimen collection/handling, submission of specimen other than nasopharyngeal swab, presence of viral mutation(s) within the areas targeted by this assay, and inadequate number of viral copies(<138 copies/mL). A negative  result must be combined with clinical observations, patient history, and epidemiological information. The expected result is Negative.  Fact Sheet for Patients:  EntrepreneurPulse.com.au  Fact Sheet for Healthcare Providers:  IncredibleEmployment.be  This test is no t yet approved or cleared by the [place] FDA and  has been authorized for detection and/or diagnosis of SARS-CoV-2 by FDA under an Emergency Use Authorization (EUA). This EUA will remain  in effect (meaning this test can be used) for the duration of the COVID-19 declaration under Section 564(b)(1) of the Act, 21 U.S.C.section 360bbb-3(b)(1), unless the authorization is terminated  or revoked sooner.       Influenza A by PCR NEGATIVE NEGATIVE Final   Influenza B by PCR NEGATIVE NEGATIVE Final    Comment: (NOTE) The Xpert Xpress SARS-CoV-2/FLU/RSV plus assay is intended as an aid in the diagnosis of influenza from Nasopharyngeal swab specimens and should not be used as a sole basis for treatment. Nasal washings and aspirates are unacceptable for Xpert Xpress SARS-CoV-2/FLU/RSV testing.  Fact Sheet for Patients: EntrepreneurPulse.com.au  Fact Sheet for Healthcare Providers: IncredibleEmployment.be  This test is not yet approved or cleared by the [place] FDA and has been authorized for detection and/or diagnosis of SARS-CoV-2 by FDA under an Emergency Use Authorization (EUA). This EUA will remain in effect (meaning this test can be used) for the duration of the COVID-19 declaration under Section 564(b)(1) of the Act, 21 U.S.C. section 360bbb-3(b)(1), unless the authorization is terminated or revoked.  Performed at [facility], [address] [place],  [address]       Scheduled Meds:  bisacodyl  10 mg Rectal Daily   enoxaparin (LOVENOX) injection  30 mg Subcutaneous Q24H   melatonin  3 mg Oral QHS    multivitamin with minerals   Oral Daily   polyethylene glycol  17 g Oral Daily   timolol  1 drop Both Eyes BID   Continuous Infusions:  azithromycin 500 mg (02/23/21 1543)   cefTRIAXone (ROCEPHIN)  IV 2 g (02/23/21 1408)   dextrose 5 % and 0.45% NaCl 70 mL/hr at 02/24/21 0322   Brief history.  Patient has a past medical history of bipolar disorder and psychosis.  Patient admitted with acute delirium and altered mental status.  Patient has been given IV fluids.  Patient was started on Seroquel as outpatient and since then has not been doing well.  She was started empirically on Rocephin and Zithromax for multifocal pneumonia seen on CT scan.  Initially her lithium level was high and she was given IV fluids and lithium helped.  Assessment/Plan:  1. Acute delirium.  History of psychosis.  Family states that she  does not have dementia.  In speaking with family things got worse after her PMD started Seroquel.  I discontinued Seroquel 2 days ago.  Continue as needed Haldol.  Initial CT scan of the head negative.  Empiric antibiotics for multifocal pneumonia with Rocephin and Zithromax.  Continue IV fluids.  Patient has been n.p.o. up until this point.  Speech therapy reeval to see if she is able to swallow. 2. Multifocal pneumonia seen on CT scan of the chest.  Continue Rocephin and Zithromax. 3. Elevated lithium level on presentation.  Lithium level into the therapeutic range.  Will check another lithium level tomorrow.  Continue IV fluids.  Family concerned that she is not getting lithium at this point.  I need to make sure that she is able to swallow prior to restarting this medication. 4. Acute kidney injury on chronic kidney disease stage II.  Creatinine improved from 1.15 down to 0.69 with IV fluids. 5. Underweight.  BMI 17.77 6. Macrocytic anemia.  B12 normal range. 7. Palliative care consultation appreciated    Code Status:     Code Status Orders  (From admission, onward)          Start     Ordered   02/21/21 1453  Full code  Continuous        02/21/21 1456        Code Status History    This patient has a current code status but no historical code status.   Advance Care Planning Activity     Family Communication: Spoke with daughter on the phone Disposition Plan: Status is: Inpatient  Dispo: The patient is from: Home              Anticipated d/c is to: To be determined              Patient currently still with altered mental status.  Patient able to answer few yes or no questions.  Still mental status very impaired from baseline   Difficult to place patient.  Hopefully not.  Consultants:  Palliative care  Pulmonary  Psychiatry  Antibiotics:  Rocephin  Zithromax  Time spent: 28 minutes, case discussed with palliative care  [place]  [facility]

## 2021-02-24 NOTE — Consult Note (Signed)
Consultation Note Date: 02/24/2021   Patient Name: Sierra Benson  DOB: 1929/12/06  MRN: [id number]  Age / Sex: 87 y.o., female  PCP: Idelle Crouch, MD Referring Physician: Loletha Grayer, MD  Reason for Consultation: Establishing goals of care  HPI/Patient Profile: Sierra Benson is a 87 y.o. female with medical history significant for bipolar disorder who was brought into the emergency room by EMS for evaluation of mental status changes.   Clinical Assessment and Goals of Care: Patient is resting in bed, opening eyes occassionally, and mumbling once. Husband is at bedside. He states they have been married 55 years. He states she has been "going downhill for the past 2-3 years". He states prior to 2 weeks ago she was alert and oriented. She has not driven in 6 months, but only due to visual impairement.   We discussed her diagnoses and GOC.  Created space and opportunity for patient  to explore thoughts and feelings regarding current medical information. He became tearful and states as long as she is as she is currently, he would like to continue all care possible. He states if she declined then the family would re-evalaute. He inquires about her timilol, which was initiated on 3/6. He requests close control of her Lithium level.    Discussed limitations of medical interventions to prolong quality of life in some situations and discussed the concept of human mortality. He states they are a family of faith and believe God alone determines when it is time to go. He states the family has discussed scenarios of care moving forward as their son is a doctor, but he does not want to share any further decisions at this time. He states the conversation is depressing to him and to speak with his daughter Sierra Benson.  Spoke with Sierra Benson. Sierra Benson discusses her mother's status prior to 2 weeks ago stating she was oriented  and independent. We discussed her current status. Sierra Benson tells me about her mother's psychiatric history and need for ECT previously. She states her mother's change began at the time of initiation of Seroquel. She tells me she feels her mother's status is due to psychosis, and that she has never been diagnosed with dementia. Sierra Benson discusses concern for patient's psychiatric needs at this time including controlling her Lithium levels.   We discussed her diagnosis of pneumonia, underlying chronic health conditions, and overall frailty due to age and low BMI.   Will reach out again tomorrow.      SUMMARY OF RECOMMENDATIONS   Family would like to be sure psychiatric component is addressed as they feel she is experiencing psychosis.  Full code/full scope.    Prognosis:   < 6 months Pneumonia, progressive bronchiectasis, cardiomegaly with right heart dilation and mild refluxing of contrast into IVC and hepatic veins, underweight.       Primary Diagnoses: Present on Admission: . Delirium . Bipolar 1 disorder, depressed, moderate ([place]) . Dehydration . Underweight due to inadequate caloric intake   I have reviewed the medical record, interviewed  the patient and family, and examined the patient. The following aspects are pertinent.  Past Medical History:  Diagnosis Date  . Bacterial pneumonia 11/2015  . Cystocele   . Glaucoma   . Insomnia   . Menopausal state   . Mitral valve disorder   . Nocturia   . Prediabetes 06/19/2015  . Rectocele   . Vaginal atrophy   . Varicose veins   . Vitamin D deficiency    Social History   Socioeconomic History  . Marital status: Married    Spouse name: Not on file  . Number of children: Not on file  . Years of education: Not on file  . Highest education level: Not on file  Occupational History  . Not on file  Tobacco Use  . Smoking status: Former Research scientist (life sciences)  . Smokeless tobacco: Never Used  Vaping Use  . Vaping Use: Never used  Substance and  Sexual Activity  . Alcohol use: No  . Drug use: No  . Sexual activity: Yes    Birth control/protection: Surgical  Other Topics Concern  . Not on file  Social History Narrative  . Not on file   Social Determinants of Health   Financial Resource Strain: Not on file  Food Insecurity: Not on file  Transportation Needs: Not on file  Physical Activity: Not on file  Stress: Not on file  Social Connections: Not on file   Family History  Problem Relation Age of Onset  . Diabetes Brother   . Lung cancer Brother   . Breast cancer Sister   . Ovarian cancer Neg Hx   . Colon cancer Neg Hx   . Heart disease Neg Hx    Scheduled Meds: . bisacodyl  10 mg Rectal Daily  . enoxaparin (LOVENOX) injection  30 mg Subcutaneous Q24H  . melatonin  3 mg Oral QHS  . multivitamin with minerals   Oral Daily  . polyethylene glycol  17 g Oral Daily  . timolol  1 drop Both Eyes BID   Continuous Infusions: . azithromycin 500 mg (02/23/21 1543)  . cefTRIAXone (ROCEPHIN)  IV 2 g (02/23/21 1408)  . dextrose 5 % and 0.45% NaCl 70 mL/hr at 02/24/21 0322   PRN Meds:.acetaminophen **OR** acetaminophen, haloperidol lactate, ondansetron **OR** ondansetron (ZOFRAN) IV Medications Prior to Admission:  Prior to Admission medications   Medication Sig Start Date End Date Taking? Authorizing Provider  lithium carbonate (ESKALITH) 450 MG CR tablet Take 450 mg by mouth at bedtime.   Yes [provider]  QUEtiapine (SEROQUEL) 25 MG tablet Take 50 mg by mouth at bedtime.   Yes [provider]  bimatoprost (LUMIGAN) 0.01 % SOLN Place 1 drop into both eyes at bedtime.     [provider]  cephALEXin (KEFLEX) 250 MG capsule Take 1 capsule (250 mg total) by mouth 4 (four) times daily. Patient not taking: No sig reported 06/23/18   Sierra Rasmussen, MD  lithium 300 MG tablet Take 1 tablet (300 mg total) by mouth at bedtime. Patient taking differently: Take 300 mg by mouth daily. 12/10/15 06/23/18   Sierra Newport, MD  magnesium oxide (MAG-OX) 400 MG tablet Take 400 mg by mouth daily. Patient not taking: Reported on 02/20/2021    [provider]  Multiple Vitamins-Minerals (MULTIVITAMIN ADULT PO) Take 1 tablet by mouth daily.     [provider]  Respiratory Therapy Supplies (FLUTTER) DEVI Use 10-15 times daily 10/20/17   Wilhelmina Mcardle, MD  timolol (TIMOPTIC) 0.25 %  ophthalmic solution Place 1 drop into both eyes 2 (two) times daily.  Patient not taking: Reported on 02/20/2021    [provider]   Allergies  Allergen Reactions  . Prednisone Swelling and Other (See Comments)    Insomnia   . Propoxyphene Hives  . Celecoxib Nausea And Vomiting  . Ibuprofen Nausea And Vomiting   Review of Systems  Unable to perform ROS   Physical Exam Constitutional:      Comments: Opened eyes intermittently. Mumbled once.    Pulmonary:     Effort: Pulmonary effort is normal.     Vital Signs: BP (!) 169/89 (BP Location: Right Leg)   Pulse 78   Temp 98.1 F (36.7 C) (Oral)   Resp 18   Ht 4\' 11"  (1.499 m)   Wt 39.9 kg   SpO2 98%   BMI 17.77 kg/m  Pain Scale: Faces   Pain Score: Asleep   SpO2: SpO2: 98 % O2 Device:SpO2: 98 % O2 Flow Rate: .   IO: Intake/output summary:   Intake/Output Summary (Last 24 hours) at 02/24/2021 1414 Last data filed at 02/24/2021 0093 Gross per 24 hour  Intake 989.02 ml  Output 900 ml  Net 89.02 ml    LBM: Last BM Date: 02/21/21 Baseline Weight: Weight: 39.9 kg Most recent weight: Weight: 39.9 kg         Time In: 1:20 Time Out: 2:30 Time Total: 70 min Greater than 50%  of this time was spent counseling and coordinating care related to the above assessment and plan.  Signed by: Asencion Gowda, NP   Please contact Palliative Medicine Team phone at [phone] for questions and concerns.  For individual provider: See Shea Evans

## 2021-02-25 DIAGNOSIS — R627 Adult failure to thrive: Secondary | ICD-10-CM | POA: Diagnosis not present

## 2021-02-25 DIAGNOSIS — N179 Acute kidney failure, unspecified: Secondary | ICD-10-CM | POA: Diagnosis not present

## 2021-02-25 DIAGNOSIS — N189 Chronic kidney disease, unspecified: Secondary | ICD-10-CM | POA: Diagnosis not present

## 2021-02-25 DIAGNOSIS — Z7189 Other specified counseling: Secondary | ICD-10-CM | POA: Diagnosis not present

## 2021-02-25 DIAGNOSIS — E43 Unspecified severe protein-calorie malnutrition: Secondary | ICD-10-CM

## 2021-02-25 DIAGNOSIS — R410 Disorientation, unspecified: Secondary | ICD-10-CM | POA: Diagnosis not present

## 2021-02-25 DIAGNOSIS — Z515 Encounter for palliative care: Secondary | ICD-10-CM | POA: Diagnosis not present

## 2021-02-25 LAB — BASIC METABOLIC PANEL
Anion gap: 6 (ref 5–15)
BUN: 17 mg/dL (ref 8–23)
CO2: 22 mmol/L (ref 22–32)
Calcium: 8.3 mg/dL — ABNORMAL LOW (ref 8.9–10.3)
Chloride: 109 mmol/L (ref 98–111)
Creatinine, Ser: 0.67 mg/dL (ref 0.44–1.00)
GFR, Estimated: >60 mL/min (ref >60)
Glucose, Bld: 96 mg/dL (ref 70–99)
Potassium: 3.8 mmol/L (ref 3.5–5.1)
Sodium: 137 mmol/L (ref 135–145)

## 2021-02-25 LAB — CBC
HCT: 42.8 % (ref 36.0–46.0)
Hemoglobin: 13.4 g/dL (ref 12.0–15.0)
MCH: 33.1 pg (ref 26.0–34.0)
MCHC: 31.3 g/dL (ref 30.0–36.0)
MCV: 105.7 fL — ABNORMAL HIGH (ref 80.0–100.0)
Platelets: 234 10*3/uL (ref 150–400)
RBC: 4.05 MIL/uL (ref 3.87–5.11)
RDW: 15 % (ref 11.5–15.5)
WBC: 12.3 10*3/uL — ABNORMAL HIGH (ref 4.0–10.5)
nRBC: 0 % (ref 0.0–0.2)

## 2021-02-25 LAB — ASPERGILLUS ANTIGEN, BAL/SERUM: Aspergillus Ag, BAL/Serum: 0.02 Index (ref 0.00–0.49)

## 2021-02-25 LAB — HISTOPLASMA ANTIGEN, URINE: Histoplasma Antigen, urine: <0.5 (ref <0.5)

## 2021-02-25 LAB — LITHIUM LEVEL: Lithium Lvl: 0.21 mmol/L — ABNORMAL LOW (ref 0.60–1.20)

## 2021-02-25 MED ORDER — MELATONIN 5 MG PO TABS
5.0000 mg | ORAL_TABLET | Freq: Every day | ORAL | Status: DC
  Administered 2021-02-25 – 2021-02-26 (×2): 5 mg via ORAL
  Filled 2021-02-25 (×2): qty 1

## 2021-02-25 NOTE — NC FL2 (Signed)
[facility] LEVEL OF CARE SCREENING TOOL     IDENTIFICATION  Patient Name: Sierra Benson Birthdate: [birth year] Sex: female Admission Date (Current Location): 02/20/2021  [facility] and [place] Number:  Engineering geologist and Address:         Provider Number: [id number]  Attending Physician Name and Address:  Sharen Hones, MD  Relative Name and Phone Number:       Current Level of Care: Hospital Recommended Level of Care: [place] Prior Approval Number:    Date Approved/Denied:   PASRR Number: Pending  Discharge Plan: SNF    Current Diagnoses: Patient Active Problem List   Diagnosis Date Noted  . Failure to thrive in adult 02/25/2021  . Severe protein-calorie malnutrition ([place]) 02/25/2021  . Acute kidney injury superimposed on CKD ([place])   . Dementia with behavioral disturbance ([place])   . Elevated lithium level   . Stage 3a chronic kidney disease ([place])   . Macrocytic anemia   . Dehydration 02/21/2021  . Multifocal pneumonia 02/21/2021  . Bronchiectasis ([place]) 02/21/2021  . Delirium 02/20/2021  . Crushing injury of finger 06/10/2017  . Mucoid impaction of bronchi 06/24/2016  . Pseudophakia of left eye 04/27/2016  . Bipolar 1 disorder, depressed, moderate ([place])   . Right lower quadrant pain 10/29/2015  . Hemorrhage of anus and rectum 10/29/2015  . History of palpitations 06/19/2015  . BP (high blood pressure) 06/19/2015  . Underweight due to inadequate caloric intake 06/19/2015  . Prediabetes 06/19/2015  . Primary open angle glaucoma of left eye, severe stage 01/09/2015  . 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 Cataract 06/27/2012    Orientation RESPIRATION BLADDER Height & Weight     Self,Place  Normal Incontinent,External catheter Weight: 39.9 kg Height:  4\' 11"  (149.9 cm)  BEHAVIORAL SYMPTOMS/MOOD NEUROLOGICAL BOWEL NUTRITION STATUS      Incontinent Diet (dys 1)  AMBULATORY STATUS COMMUNICATION OF NEEDS Skin   Total Care  Verbally Normal                       Personal Care Assistance Level of Assistance              Functional Limitations Info             SPECIAL CARE FACTORS FREQUENCY  PT (By licensed PT),OT (By licensed OT)                    Contractures      Additional Factors Info  Code Status,Allergies Code Status Info: Full             Current Medications (02/25/2021):  This is the current hospital active medication list Current Facility-Administered Medications  Medication Dose Route Frequency Provider Last Rate Last Admin  . acetaminophen (TYLENOL) tablet 650 mg  650 mg Oral Q6H PRN Agbata, Tochukwu, MD       Or  . acetaminophen (TYLENOL) suppository 650 mg  650 mg Rectal Q6H PRN Agbata, Tochukwu, MD      . bisacodyl (DULCOLAX) suppository 10 mg  10 mg Rectal Daily Loletha Grayer, MD   10 mg at 02/25/21 0921  . enoxaparin (LOVENOX) injection 30 mg  30 mg Subcutaneous Q24H Agbata, Tochukwu, MD   30 mg at 02/24/21 2133  . haloperidol lactate (HALDOL) injection 1 mg  1 mg Intramuscular Q8H PRN Salley Scarlet, MD   1 mg at 02/22/21 0252  . melatonin tablet 5 mg  5 mg Oral QHS  Sharen Hones, MD      . multivitamin with minerals tablet   Oral Daily Agbata, Tochukwu, MD      . ondansetron (ZOFRAN) tablet 4 mg  4 mg Oral Q6H PRN Agbata, Tochukwu, MD       Or  . ondansetron (ZOFRAN) injection 4 mg  4 mg Intravenous Q6H PRN Agbata, Tochukwu, MD      . polyethylene glycol (MIRALAX / GLYCOLAX) packet 17 g  17 g Oral Daily Vanessa [place], MD      . timolol (TIMOPTIC) 0.25 % ophthalmic solution 1 drop  1 drop Both Eyes BID Loletha Grayer, MD   1 drop at 02/25/21 0919     Discharge Medications: Please see discharge summary for a list of discharge medications.  Relevant Imaging Results:  Relevant Lab Results:   Additional Information    Beverly Sessions, RN

## 2021-02-25 NOTE — Progress Notes (Signed)
  Speech Language Pathology Treatment: Dysphagia  Patient Details Name: Sierra Benson MRN: [id number] DOB: 11-26-1929 Today's Date: 02/25/2021 Time: 8502-7741 SLP Time Calculation (min) (ACUTE ONLY): 14 min  Assessment / Plan / Recommendation Clinical Impression  Pt was awake and alert and responded to SLP questions. During this session, she was more confused than our previous session yesterday. She began "talking to Sierra Benson" her daughter whom she believed was present in her room. Pt was able to be redirected briefly. While she was agreeable to trying some ice chips and thin liquids, she easily became too distracted by trying to find [place]. She stated, "I got too many things to worry about, where is Sierra Benson, she was standing right there?" During one last brief redirection, pt agreed an additional time but when the spoon of ice chips were presented, pt swatted my hand away.   Continue to recommend current diet of dysphagia 1 with nectar thick liquids via cup and medicine crushed in puree.    HPI HPI: Sierra Benson is a 87 y.o. female with medical history significant for bipolar disorder who was brought into the emergency room by EMS for evaluation of mental status changes that have been provessive over the last couple of weeks. Her husband states that she has been awake for 2 days straight and has been wandering around the house, very irritable and agitated.  Oral intake is very poor and patient has lost about 10 pounds in the last 1 month.  She had a CT scan of the chest with contrast to rule out a PE which shows progression and parenchymal findings from 2018.  Findings are most consistent with progression of chronic indolent infection with MAI.  She is also noted to have right heart dilatation on CT scan. Patient will be admitted to the hospital for delirium and has been seen by psychiatry.      SLP Plan  Continue with current plan of care       Recommendations  Diet recommendations:  Dysphagia 1 (puree);Nectar-thick liquid Liquids provided via: Cup Medication Administration: Crushed with puree Supervision:  (Total assistance required for self-feeding) Compensations: Slow rate;Minimize environmental distractions;Small sips/bites Postural Changes and/or Swallow Maneuvers: Seated upright 90 degrees;Upright 30-60 min after meal                Oral Care Recommendations: Oral care BID Follow up Recommendations: Skilled Nursing facility SLP Visit Diagnosis: Dysphagia, unspecified (R13.10);Cognitive communication deficit (R41.841) Plan: Continue with current plan of care       GO              Sierra Benson B. Rutherford Nail M.S., [place], [place] Office [phone]   Stormy Fabian 02/25/2021, 5:01 PM

## 2021-02-25 NOTE — NC FL2 (Signed)
[place] LEVEL OF CARE SCREENING TOOL     IDENTIFICATION  Patient Name: Sierra Benson Birthdate: 07-26-1929 Sex: female Admission Date (Current Location): 02/20/2021  [facility] and [place] Number:  Engineering geologist and Address:  [facility], [address] [place], [place] [address]      Provider Number: [id number]  Attending Physician Name and Address:  Sharen Hones, MD  Relative Name and Phone Number:       Current Level of Care: Hospital Recommended Level of Care: [place] Prior Approval Number:    Date Approved/Denied:   PASRR Number: Pending  Discharge Plan: SNF    Current Diagnoses: Patient Active Problem List   Diagnosis Date Noted  . Failure to thrive in adult 02/25/2021  . Severe protein-calorie malnutrition ([place]) 02/25/2021  . Acute kidney injury superimposed on CKD ([place])   . Dementia with behavioral disturbance ([place])   . Elevated lithium level   . Stage 3a chronic kidney disease ([place])   . Macrocytic anemia   . Dehydration 02/21/2021  . Multifocal pneumonia 02/21/2021  . Bronchiectasis ([place]) 02/21/2021  . Delirium 02/20/2021  . Crushing injury of finger 06/10/2017  . Mucoid impaction of bronchi 06/24/2016  . Pseudophakia of left eye 04/27/2016  . Bipolar 1 disorder, depressed, moderate ([place])   . Right lower quadrant pain 10/29/2015  . Hemorrhage of anus and rectum 10/29/2015  . History of palpitations 06/19/2015  . BP (high blood pressure) 06/19/2015  . Underweight due to inadequate caloric intake 06/19/2015  . Prediabetes 06/19/2015  . Primary open angle glaucoma of left eye, severe stage 01/09/2015  . 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 Cataract 06/27/2012    Orientation RESPIRATION BLADDER Height & Weight     Self,Place  Normal Incontinent,External catheter Weight: 39.9 kg Height:  4\' 11"  (149.9 cm)  BEHAVIORAL SYMPTOMS/MOOD NEUROLOGICAL BOWEL NUTRITION STATUS       Incontinent Diet (dys 1)  AMBULATORY STATUS COMMUNICATION OF NEEDS Skin   Total Care Verbally Normal                       Personal Care Assistance Level of Assistance              Functional Limitations Info             SPECIAL CARE FACTORS FREQUENCY  PT (By licensed PT),OT (By licensed OT)                    Contractures Contractures Info: Not present    Additional Factors Info  Allergies Code Status Info: Full Allergies Info: Prednisone, Propoxyphene, Celecoxib, Ibuprofen           Current Medications (02/25/2021):  This is the current hospital active medication list Current Facility-Administered Medications  Medication Dose Route Frequency Provider Last Rate Last Admin  . acetaminophen (TYLENOL) tablet 650 mg  650 mg Oral Q6H PRN Agbata, Tochukwu, MD       Or  . acetaminophen (TYLENOL) suppository 650 mg  650 mg Rectal Q6H PRN Agbata, Tochukwu, MD      . bisacodyl (DULCOLAX) suppository 10 mg  10 mg Rectal Daily Loletha Grayer, MD   10 mg at 02/25/21 0921  . enoxaparin (LOVENOX) injection 30 mg  30 mg Subcutaneous Q24H Agbata, Tochukwu, MD   30 mg at 02/24/21 2133  . haloperidol lactate (HALDOL) injection 1 mg  1 mg Intramuscular Q8H PRN Salley Scarlet, MD   1 mg  at 02/22/21 0252  . melatonin tablet 5 mg  5 mg Oral QHS Sharen Hones, MD      . multivitamin with minerals tablet   Oral Daily Agbata, Tochukwu, MD      . ondansetron (ZOFRAN) tablet 4 mg  4 mg Oral Q6H PRN Agbata, Tochukwu, MD       Or  . ondansetron (ZOFRAN) injection 4 mg  4 mg Intravenous Q6H PRN Agbata, Tochukwu, MD      . polyethylene glycol (MIRALAX / GLYCOLAX) packet 17 g  17 g Oral Daily Vanessa [place], MD      . timolol (TIMOPTIC) 0.25 % ophthalmic solution 1 drop  1 drop Both Eyes BID Loletha Grayer, MD   1 drop at 02/25/21 0919     Discharge Medications: Please see discharge summary for a list of discharge medications.  Relevant Imaging Results:  Relevant Lab  Results:   Additional Information ss [id number]  Beverly Sessions, RN

## 2021-02-25 NOTE — Care Management (Signed)
[id number] Sierra Benson Date of Birth: 11/16/1929 Date: 02/25/21   To Whom It May Concern:  Please be advised that the above-named patient will require a short-term nursing home stay - anticipated 30 days or less for rehabilitation and strengthening.  The plan is for return home.

## 2021-02-25 NOTE — TOC Initial Note (Addendum)
Transition of Care [facility]) - Initial/Assessment Note    Patient Details  Name: Sierra Benson MRN: [id number] Date of Birth: 12-21-1928  [facility] CM/SW Contact:    Beverly Sessions, RN Phone Number: 02/25/2021, 3:51 PM  Clinical Narrative:                 Patient from home with husband Patient admitted from home with delirium.   Called husband to speak with him about discharge disposition.  He deferred me to daughter Mariann Laster. Mariann Laster called her brother patient's son Antony Haste on 3 way call.   They request for patient to "be transferred to [facility] psych immediatly"  Notified them that psych is not currently recommending inpatient psych.  Antony Haste request call from psych.  Dr Alexander Bergeron and Dr Roosevelt Locks notified.   Discussed recommendation for SNF.  They are in agreement for bed search PASRR went to level 2 30 day note and fl2 sent for signature.  Bed search initiated.    Clinical will need to be sent in to [facility] after forms are signed by MD     Update:  Clinical uploaded to [place] must  Expected Discharge Plan: Skilled Nursing Facility Barriers to Discharge: Continued Medical Work up   Patient Goals and CMS Choice        Expected Discharge Plan and Services Expected Discharge Plan: National       Living arrangements for the past 2 months: Single Family Home                                      Prior Living Arrangements/Services Living arrangements for the past 2 months: Single Family Home Lives with:: Spouse              Current home services: DME    Activities of Daily Living   ADL Screening (condition at time of admission) Is the patient deaf or have difficulty hearing?: No Does the patient have difficulty seeing, even when wearing glasses/contacts?: No Does the patient have difficulty concentrating, remembering, or making decisions?: Yes Does the patient have difficulty dressing or bathing?: Yes Does the patient have difficulty  walking or climbing stairs?: Yes  Permission Sought/Granted                  Emotional Assessment           Psych Involvement: Yes (comment)  Admission diagnosis:  Delirium [R41.0] Weakness [R53.1] Bipolar 1 disorder ([place]) [F31.9] Constipation, unspecified constipation type [K59.00] Elevated lithium level [R79.89] Community acquired pneumonia, unspecified laterality [J18.9] Patient Active Problem List   Diagnosis Date Noted  . Failure to thrive in adult 02/25/2021  . Severe protein-calorie malnutrition ([place]) 02/25/2021  . Acute kidney injury superimposed on CKD ([place])   . Dementia with behavioral disturbance ([place])   . Elevated lithium level   . Stage 3a chronic kidney disease ([place])   . Macrocytic anemia   . Dehydration 02/21/2021  . Multifocal pneumonia 02/21/2021  . Bronchiectasis ([place]) 02/21/2021  . Delirium 02/20/2021  . Crushing injury of finger 06/10/2017  . Mucoid impaction of bronchi 06/24/2016  . Pseudophakia of left eye 04/27/2016  . Bipolar 1 disorder, depressed, moderate ([place])   . Right lower quadrant pain 10/29/2015  . Hemorrhage of anus and rectum 10/29/2015  . History of palpitations 06/19/2015  . BP (high blood pressure) 06/19/2015  . Underweight due to inadequate caloric  intake 06/19/2015  . Prediabetes 06/19/2015  . Primary open angle glaucoma of left eye, severe stage 01/09/2015  . 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 Cataract 06/27/2012   PCP:  Idelle Crouch, MD Pharmacy:   [id number] [address], [place] [address] Phone: [phone] Fax: [phone]  [id number] - [place], [place] - [place] AT [place] [address] [place] [address] Phone: [phone] Fax: [phone]     Social Determinants of Health ([place]) Interventions    Readmission Risk Interventions No flowsheet data found.

## 2021-02-25 NOTE — Progress Notes (Signed)
Daily Progress Note   Patient Name: Sierra Benson       Date: 02/25/2021 DOB: September 29, 1929  Age: 87 y.o. MRN#: [id number] Attending Physician: Sharen Hones, MD Primary Care Physician: Idelle Crouch, MD Admit Date: 02/20/2021  Reason for Consultation/Follow-up: Establishing goals of care  Subjective: Patient is resting in bed. She is alert and able to tell me her name and location. She states she has 3 children and has been married 62 years. She is not in a place of having reliable GOC, though she does state she would not want a feeding tube.   Called to speak with Mariann Laster who conferenced in patient's son Antony Haste who is an Tour manager in [place].   They discuss concerns that her lithium levels and dehydration contributed to her current state. They would like a geri-psych admission to determine mediation needs. They understand the need for rehab and her swallowing deficits. They state they would want a feeding tube placed if it were indicated, and state if she removed it, they understand they would have to re-evaluate. They would like all care possible at this time including CPR. The family would like to speak with her to determine [place] when she is at a place of being able to have [employer] conversations. They would like SNF placment in [place] where geri-psych facility is.     Length of Stay: 4  Current Medications: Scheduled Meds:  . bisacodyl  10 mg Rectal Daily  . enoxaparin (LOVENOX) injection  30 mg Subcutaneous Q24H  . melatonin  5 mg Oral QHS  . multivitamin with minerals   Oral Daily  . polyethylene glycol  17 g Oral Daily  . timolol  1 drop Both Eyes BID    Continuous Infusions: . azithromycin 500 mg (02/25/21 1434)    PRN Meds: acetaminophen **OR** acetaminophen,  haloperidol lactate, ondansetron **OR** ondansetron (ZOFRAN) IV  Physical Exam Pulmonary:     Effort: Pulmonary effort is normal.  Neurological:     Mental Status: She is alert.             Vital Signs: BP (!) 158/72 (BP Location: Left Leg)   Pulse 73   Temp 97.7 F (36.5 C)   Resp 14   Ht 4\' 11"  (1.499 m)   Wt 39.9 kg   SpO2  99%   BMI 17.77 kg/m  SpO2: SpO2: 99 % O2 Device: O2 Device: Room Air O2 Flow Rate:    Intake/output summary:   Intake/Output Summary (Last 24 hours) at 02/25/2021 1532 Last data filed at 02/25/2021 2876 Gross per 24 hour  Intake 1434 ml  Output 650 ml  Net 784 ml   LBM: Last BM Date: 02/21/21 Baseline Weight: Weight: 39.9 kg Most recent weight: Weight: 39.9 kg        Patient Active Problem List   Diagnosis Date Noted  . Failure to thrive in adult 02/25/2021  . Severe protein-calorie malnutrition ([place]) 02/25/2021  . Acute kidney injury superimposed on CKD ([place])   . Dementia with behavioral disturbance ([place])   . Elevated lithium level   . Stage 3a chronic kidney disease ([place])   . Macrocytic anemia   . Dehydration 02/21/2021  . Multifocal pneumonia 02/21/2021  . Bronchiectasis ([place]) 02/21/2021  . Delirium 02/20/2021  . Crushing injury of finger 06/10/2017  . Mucoid impaction of bronchi 06/24/2016  . Pseudophakia of left eye 04/27/2016  . Bipolar 1 disorder, depressed, moderate ([place])   . Right lower quadrant pain 10/29/2015  . Hemorrhage of anus and rectum 10/29/2015  . History of palpitations 06/19/2015  . BP (high blood pressure) 06/19/2015  . Underweight due to inadequate caloric intake 06/19/2015  . Prediabetes 06/19/2015  . Primary open angle glaucoma of left eye, severe stage 01/09/2015  . 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 Cataract 06/27/2012    Palliative Care Assessment & Plan   Recommendations/Plan: Recommend outpatient palliative   Code Status:    Code Status Orders  (From admission, onward)          Start     Ordered   02/21/21 1453  Full code  Continuous        02/21/21 1456        Code Status History    This patient has a current code status but no historical code status.   Advance Care Planning Activity      Prognosis:  < 6 months    Care plan was discussed with attending and [place] via epic chat.   Thank you for allowing the Palliative Medicine Team to assist in the care of this patient.   Total Time 35 min Prolonged Time Billed  no      Greater than 50%  of this time was spent counseling and coordinating care related to the above assessment and plan.  Asencion Gowda, NP  Please contact Palliative Medicine Team phone at [phone] for questions and concerns.

## 2021-02-25 NOTE — Progress Notes (Signed)
Pulmonary Medicine          Date: 02/25/2021,   MRN# [id number] Sierra Benson 03-30-1929     AdmissionWeight: 39.9 kg                 CurrentWeight: 39.9 kg   Referring physician: Dr. Francine Graven   CHIEF COMPLAINT:   Progressive multifocal pneumonia with bronchiectasis mediastinal and hilar adenopathy   HISTORY OF PRESENT ILLNESS   This is a pleasant 87 year old female with a history of psychiatric disorder including bipolar, came in for confusion with altered mental status, husband states she has had poor p.o. nutrition and weight loss over the last 1 month has been on her normal antidepression medications,Labs show sodium 137, potassium 4.4, chloride 109, bicarb 23, glucose 89, BUN 49, creatinine 1.15, calcium 9.7, alkaline phosphatase 113, albumin 3.3, AST 20, ALT 13, total protein 8.1, total CK 8, procalcitonin 0.14, white count 12.3, hemoglobin 11.9, hematocrit 38.1, MCV 105, RDW 15.4, platelet count 297, lithium 1.32, TSH 2.5, T4 1.08 CT scan of abdomen and pelvis shows moderate large volume of stool throughout the colon with stool distending the rectum, suggesting fecal impaction. Mild stranding of the perirectal fat. No obstruction or perforation. Trace free fluid in the pelvis is likely reactive. Mild contrast refluxing into the hepatic veins and IVC, can be seen with right heart failure. CT angiogram of the chest shows no pulmonary embolus. Progressive multifocal bronchiectasis with multifocal nodular, tree-in-bud, and consolidative airspace opacities throughout both lungs. Definite progression in parenchymal findings from 2018. Findings are most consistent with progression of chronic indolent  infection, Mycobacterium avium complex. Cardiomegaly with primarily right heart dilatation. Mild reflux of contrast into the IVC consistent with elevated right heart pressures. Mild mediastinal and right hilar adenopathy is likely reactive. Trace left pleural effusion. Cervical  spine CT shows no acute cervical spine fracture. Multilevel cervical spondylosis unchanged. Patchy bilateral upper lobe airspace disease. CT scan of the head without contrast shows no acute intracranial process.  PCCM consultation for additional evaluation managed    02/23/21- patient is stable, she is only on 1L/min [place].  Mentation remains an issue with mostly sleeping but opens eyes to verbal communication. Infectious workup in process.   02/24/21- patient opening eyes spontaneously and mumbles words which is improvement from prior. WBC count trending down.  Presumably AMS due to pscyhiatric meds  02/25/21- patient is significantly improved, she is speaking in full sentences and is on room air.  I will re-attempt to collect resp cultures since her chest imaging is terrible.  PAST MEDICAL HISTORY   Past Medical History:  Diagnosis Date  . Bacterial pneumonia 11/2015  . Cystocele   . Glaucoma   . Insomnia   . Menopausal state   . Mitral valve disorder   . Nocturia   . Prediabetes 06/19/2015  . Rectocele   . Vaginal atrophy   . Varicose veins   . Vitamin D deficiency      SURGICAL HISTORY   Past Surgical History:  Procedure Laterality Date  . BREAST BIOPSY Right    benign nodule  . EYE SURGERY Left    cataract removed  . FOOT SURGERY    . VAGINAL HYSTERECTOMY     menorrhagia     FAMILY HISTORY   Family History  Problem Relation Age of Onset  . Diabetes Brother   . Lung cancer Brother   . Breast cancer Sister   . Ovarian cancer Neg Hx   . Colon  cancer Neg Hx   . Heart disease Neg Hx      SOCIAL HISTORY   Social History   Tobacco Use  . Smoking status: Former Research scientist (life sciences)  . Smokeless tobacco: Never Used  Vaping Use  . Vaping Use: Never used  Substance Use Topics  . Alcohol use: No  . Drug use: No     MEDICATIONS    Home Medication:    Current Medication:  Current Facility-Administered Medications:  .  acetaminophen (TYLENOL) tablet 650 mg, 650 mg, Oral,  Q6H PRN **OR** acetaminophen (TYLENOL) suppository 650 mg, 650 mg, Rectal, Q6H PRN, Agbata, Tochukwu, MD .  bisacodyl (DULCOLAX) suppository 10 mg, 10 mg, Rectal, Daily, Leslye Peer, Richard, MD, 10 mg at 02/25/21 0921 .  enoxaparin (LOVENOX) injection 30 mg, 30 mg, Subcutaneous, Q24H, Agbata, Tochukwu, MD, 30 mg at 02/24/21 2133 .  haloperidol lactate (HALDOL) injection 1 mg, 1 mg, Intramuscular, Q8H PRN, Salley Scarlet, MD, 1 mg at 02/22/21 0252 .  melatonin tablet 5 mg, 5 mg, Oral, QHS, Sharen Hones, MD .  multivitamin with minerals tablet, , Oral, Daily, Agbata, Tochukwu, MD .  ondansetron (ZOFRAN) tablet 4 mg, 4 mg, Oral, Q6H PRN **OR** ondansetron (ZOFRAN) injection 4 mg, 4 mg, Intravenous, Q6H PRN, Agbata, Tochukwu, MD .  polyethylene glycol (MIRALAX / GLYCOLAX) packet 17 g, 17 g, Oral, Daily, Jari Pigg, Mary E, MD .  timolol (TIMOPTIC) 0.25 % ophthalmic solution 1 drop, 1 drop, Both Eyes, BID, Wieting, Richard, MD, 1 drop at 02/25/21 0919    ALLERGIES   Prednisone, Propoxyphene, Celecoxib, and Ibuprofen     REVIEW OF SYSTEMS    Review of Systems:  Gen:  Denies  fever, sweats, chills weigh loss  HEENT: Denies blurred vision, double vision, ear pain, eye pain, hearing loss, nose bleeds, sore throat Cardiac:  No dizziness, chest pain or heaviness, chest tightness,edema Resp:   Denies cough or sputum porduction, shortness of breath,wheezing, hemoptysis,  Gi: Denies swallowing difficulty, stomach pain, nausea or vomiting, diarrhea, constipation, bowel incontinence Gu:  Denies bladder incontinence, burning urine Ext:   Denies Joint pain, stiffness or swelling Skin: Denies  skin rash, easy bruising or bleeding or hives Endoc:  Denies polyuria, polydipsia , polyphagia or weight change Psych:   Denies depression, insomnia or hallucinations   Other:  All other systems negative   VS: BP (!) 173/76 (BP Location: Left Leg)   Pulse 75   Temp 97.7 F (36.5 C) (Oral)   Resp 15   Ht 4'  11" (1.499 m)   Wt 39.9 kg   SpO2 99%   BMI 17.77 kg/m      PHYSICAL EXAM    GENERAL:NAD, no fevers, chills, no weakness no fatigue HEAD: Normocephalic, atraumatic.  EYES: Pupils equal, round, reactive to light. Extraocular muscles intact. No scleral icterus.  MOUTH: Moist mucosal membrane. Dentition intact. No abscess noted.  EAR, NOSE, THROAT: Clear without exudates. No external lesions.  NECK: Supple. No thyromegaly. No nodules. No JVD.  PULMONARY: rhonchi bilaterally CARDIOVASCULAR: S1 and S2. Regular rate and rhythm. No murmurs, rubs, or gallops. No edema. Pedal pulses 2+ bilaterally.  GASTROINTESTINAL: Soft, nontender, nondistended. No masses. Positive bowel sounds. No hepatosplenomegaly.  MUSCULOSKELETAL: No swelling, clubbing, or edema. Range of motion full in all extremities.  NEUROLOGIC: Cranial nerves II through XII are intact. No gross focal neurological deficits. Sensation intact. Reflexes intact.  SKIN: No ulceration, lesions, rashes, or cyanosis. Skin warm and dry. Turgor intact.  PSYCHIATRIC: Mood, affect within normal limits. The  patient is awake, alert and oriented x 3. Insight, judgment intact.       IMAGING    CT Head Wo Contrast  Result Date: 02/20/2021 CLINICAL DATA:  Head trauma EXAM: CT HEAD WITHOUT CONTRAST TECHNIQUE: Contiguous axial images were obtained from the base of the skull through the vertex without intravenous contrast. COMPARISON:  01/12/2021 FINDINGS: Brain: No acute infarct or hemorrhage. Lateral ventricles and midline structures are stable. No acute extra-axial fluid collections. No mass effect. Vascular: No hyperdense vessel or unexpected calcification. Skull: Normal. Negative for fracture or focal lesion. Sinuses/Orbits: Minimal fluid within the right maxillary sinus. Remaining paranasal sinuses are clear. Postsurgical changes left orbit. Other: None. IMPRESSION: 1. No acute intracranial process. 2. Minimal right maxillary sinus disease.  Electronically Signed   By: Randa Ngo M.D.   On: 02/20/2021 19:04   CT Angio Chest PE W and/or Wo Contrast  Result Date: 02/20/2021 CLINICAL DATA:  PE suspected, high prob No additional relevant history provided or available. EXAM: CT ANGIOGRAPHY CHEST WITH CONTRAST TECHNIQUE: Multidetector CT imaging of the chest was performed using the standard protocol during bolus administration of intravenous contrast. Multiplanar CT image reconstructions and MIPs were obtained to evaluate the vascular anatomy. CONTRAST:  42m OMNIPAQUE IOHEXOL 350 MG/ML SOLN COMPARISON:  Most recent chest radiograph 01/12/2021. Chest CT 11/03/2017 FINDINGS: Cardiovascular: There are no filling defects within the pulmonary arteries to suggest pulmonary embolus. Mild aortic atherosclerosis and tortuosity. Cardiomegaly with primarily right heart dilatation. Mild reflux of contrast into the IVC. No pericardial effusion. Mediastinum/Nodes: 12 mm right hilar node, comparison to priors difficult due to differences in technique. No left hilar adenopathy. 13 mm prominent subcarinal node. No thyroid nodule. Patulous esophagus. Lungs/Pleura: Multifocal bronchiectasis that is progressed from prior exam. Areas of bronchial filling involving the right lower lobe. There are multifocal nodular, tree-in-bud, and consolidative airspace opacities throughout both lungs. Many of these consolidative areas are calcified or high-density. Overall interval progression from 2018 CT. Similar trace left pleural effusion. No findings of pulmonary edema. Upper Abdomen: Assessed on concurrent abdominal CT, reported separately. Musculoskeletal: Scoliosis and exaggerated thoracic kyphosis. Multilevel degenerative change in the spine. There are no acute or suspicious osseous abnormalities. Review of the MIP images confirms the above findings. IMPRESSION: 1. No pulmonary embolus. 2. Progressive multifocal bronchiectasis with multifocal nodular, tree-in-bud, and  consolidative airspace opacities throughout both lungs. Definite progression in parenchymal findings from 2018. Findings are most consistent with progression of chronic indolent infection infection, Mycobacterium avium complex. 3. Cardiomegaly with primarily right heart dilatation. Mild reflux of contrast into the IVC consistent with elevated right heart pressures. 4. Mild mediastinal and right hilar adenopathy is likely reactive. 5. Trace left pleural effusion. Aortic Atherosclerosis (ICD10-I70.0). Electronically Signed   By: MKeith RakeM.D.   On: 02/20/2021 19:14   CT Cervical Spine Wo Contrast  Result Date: 02/20/2021 CLINICAL DATA:  Neck trauma EXAM: CT CERVICAL SPINE WITHOUT CONTRAST TECHNIQUE: Multidetector CT imaging of the cervical spine was performed without intravenous contrast. Multiplanar CT image reconstructions were also generated. COMPARISON:  03/03/2011 FINDINGS: Alignment: Minimal anterolisthesis of C4 relative to C5, stable. Otherwise alignment is anatomic. Skull base and vertebrae: No acute fracture. No primary bone lesion or focal pathologic process. Soft tissues and spinal canal: No prevertebral fluid or swelling. No visible canal hematoma. Disc levels: There is mild diffuse cervical spondylosis greatest at C5-6. Prominent facet hypertrophic changes greatest at C3-4 and C4-5. Upper chest: Airway is patent. Patchy areas of airspace disease are seen at  the apices. No effusion or pneumothorax. Other: Reconstructed images demonstrate no additional findings. IMPRESSION: 1. No acute cervical spine fracture. 2. Multilevel cervical spondylosis unchanged. 3. Patchy bilateral upper lobe airspace disease. Electronically Signed   By: Randa Ngo M.D.   On: 02/20/2021 19:08   CT ABDOMEN PELVIS W CONTRAST  Result Date: 02/20/2021 CLINICAL DATA:  Abdominal distension. EXAM: CT ABDOMEN AND PELVIS WITH CONTRAST TECHNIQUE: Multidetector CT imaging of the abdomen and pelvis was performed using the  standard protocol following bolus administration of intravenous contrast. CONTRAST:  4m OMNIPAQUE IOHEXOL 350 MG/ML SOLN COMPARISON:  No prior abdominal imaging. Chest CT performed concurrently, reported separately. FINDINGS: Lower chest: Assessed on concurrent chest CT, reported separately. Hepatobiliary: Mild contrast refluxing into the hepatic veins and IVC. No focal hepatic lesion. Unremarkable gallbladder. No calcified gallstone or findings of cholecystitis. Pancreas: Parenchymal atrophy. Pancreas is partially obscured by motion. No evidence of pancreatic inflammation, ductal dilatation or mass. Spleen: Normal in size without focal abnormality. Adrenals/Urinary Tract: No adrenal nodule. No hydronephrosis or perinephric edema. Small left renal cyst. Symmetric excretion on delayed phase imaging. Urinary bladder is unremarkable. Stomach/Bowel: Bowel evaluation is limited in the absence of enteric contrast, paucity of intra-abdominal fat, and patient motion. Stomach is grossly unremarkable. There is no small bowel dilatation or evidence of obstruction. Moderate large volume of stool throughout the colon. There is stool distending the rectum. Mild stranding of the perirectal fat. Appendix not confidently visualized, no evidence of appendicitis. Vascular/Lymphatic: Aortic atherosclerosis and tortuosity. Patent portal vein. No enlarged abdominopelvic lymph nodes. Reproductive: Hysterectomy.  No evidence of adnexal mass. Other: Trace free fluid in the pelvis. No free air or focal abscess. No abdominal wall hernia. Musculoskeletal: Scoliosis and degenerative change in the spine. There are no acute or suspicious osseous abnormalities. IMPRESSION: 1. Moderate large volume of stool throughout the colon with stool distending the rectum, suggesting fecal impaction. Mild stranding of the perirectal fat. No obstruction or perforation. 2. Trace free fluid in the pelvis is likely reactive. 3. Mild contrast refluxing into the  hepatic veins and IVC, can be seen with right heart failure. Aortic Atherosclerosis (ICD10-I70.0). Electronically Signed   By: MKeith RakeM.D.   On: 02/20/2021 19:17   ECHOCARDIOGRAM COMPLETE  Result Date: 02/24/2021    ECHOCARDIOGRAM REPORT   Patient Name:   Sierra HOLIKDate of Exam: 02/23/2021 Medical Rec #:  [id number]        Height:       59.0 in Accession #:    [id number]       Weight:       88.0 lb Date of Birth:  [birth year]         BSA:          1.301 m Patient Age:    89years          BP:           177/99 mmHg Patient Gender: F                 HR:           98 bpm. Exam Location:  [facility] Procedure: 2D Echo, Cardiac Doppler and Color Doppler Indications:     CHF-acute diastolic IJ49.70 History:         Patient has no prior history of Echocardiogram examinations.                  Mitral valve disorder.  Sonographer:  Sherrie Sport RDCS (AE) Referring Phys:  [id number] Royce Macadamia AGBATA Diagnosing Phys: Yolonda Kida MD  Sonographer Comments: Technically challenging study due to limited acoustic windows, no apical window and no subcostal window. Image acquisition challenging due to patient body habitus and Pt lying on right side in fetal position and is non -verbal. IMPRESSIONS  1. RV failure / severely dilated severely reduced RVF.  2. Left ventricular ejection fraction, by estimation, is 70 to 75%. The left ventricle has hyperdynamic function. The left ventricle has no regional wall motion abnormalities. There is moderate left ventricular hypertrophy. Left ventricular diastolic parameters are consistent with Grade III diastolic dysfunction (restrictive).  3. Right ventricular systolic function is severely reduced. The right ventricular size is moderately enlarged.  4. Left atrial size was mildly dilated.  5. Right atrial size was mildly dilated.  6. The mitral valve is grossly normal. No evidence of mitral valve regurgitation.  7. The aortic valve is grossly normal. Aortic valve regurgitation is  not visualized. Mild to moderate aortic valve sclerosis/calcification is present, without any evidence of aortic stenosis. FINDINGS  Left Ventricle: Left ventricular ejection fraction, by estimation, is 70 to 75%. The left ventricle has hyperdynamic function. The left ventricle has no regional wall motion abnormalities. The left ventricular internal cavity size was normal in size. There is moderate left ventricular hypertrophy. Left ventricular diastolic parameters are consistent with Grade III diastolic dysfunction (restrictive). Right Ventricle: The right ventricular size is moderately enlarged. No increase in right ventricular wall thickness. Right ventricular systolic function is severely reduced. Left Atrium: Left atrial size was mildly dilated. Right Atrium: Right atrial size was mildly dilated. Pericardium: There is no evidence of pericardial effusion. Mitral Valve: The mitral valve is grossly normal. No evidence of mitral valve regurgitation. Tricuspid Valve: The tricuspid valve is normal in structure. Tricuspid valve regurgitation is not demonstrated. Aortic Valve: The aortic valve is grossly normal. Aortic valve regurgitation is not visualized. Mild to moderate aortic valve sclerosis/calcification is present, without any evidence of aortic stenosis. Pulmonic Valve: The pulmonic valve was normal in structure. Pulmonic valve regurgitation is not visualized. Aorta: The ascending aorta was not well visualized. IAS/Shunts: No atrial level shunt detected by color flow Doppler. Additional Comments: RV failure / severely dilated severely reduced RVF.  LEFT VENTRICLE PLAX 2D LVIDd:         2.30 cm LVIDs:         1.40 cm LV PW:         0.88 cm LV IVS:        1.68 cm LVOT diam:     2.00 cm LVOT Area:     3.14 cm  LEFT ATRIUM         Index LA diam:    2.50 cm 1.92 cm/m                        PULMONIC VALVE AORTA                 PV Vmax:        0.55 m/s Ao Root diam: 3.20 cm PV Peak grad:   1.2 mmHg                        RVOT Peak grad: 1 mmHg  TRICUSPID VALVE TR Peak grad:   14.9 mmHg TR Vmax:        193.00 cm/s  SHUNTS Systemic Diam: 2.00 cm Dwayne D Callwood  MD Electronically signed by Yolonda Kida MD Signature Date/Time: 02/24/2021/6:24:24 PM    Final       ASSESSMENT/PLAN   Respiratory distress with chronic pnemonia    - patient with confusion unclear if septic encephalopathy due to pna vs medication related from psych disease  - patient is unable to provide respiratory cultures due to AMS  - she is 87yo with severe cachexia and significant comorbid history, will ask palliative care to evaluate. - COVID19 negative  - supplemental O2 during my evaluation -room air - will perform infectious workup for pneumonia -Respiratory viral panel -serum fungitell -legionella ab -strep pneumoniae ur AG -Histoplasma Ur Ag -sputum resp cultures -AFB sputum expectorated specimen -reviewed pertinent imaging with patient today - ESR -AFR and aspergillus workup if patient is able    Thank you for allowing me to participate in the care of this patient.   Patient/Family are satisfied with care plan and all questions have been answered.  This document was prepared using Dragon voice recognition software and may include unintentional dictation errors.     Ottie Glazier, M.D.  Division of [place]

## 2021-02-25 NOTE — Progress Notes (Addendum)
PROGRESS NOTE    Sierra Benson  JYN:[id number] DOB: [birth year] DOA: 02/20/2021 PCP: Idelle Crouch, MD   Chief complaint.  Altered mental status. Brief Narrative:  Patient has a past medical history of bipolar disorder and psychosis.  Patient admitted with acute delirium and altered mental status.  Patient has been given IV fluids.  Patient was started on Seroquel as outpatient and since then has not been doing well.  She was started empirically on Rocephin and Zithromax for multifocal pneumonia seen on CT scan.  Initially her lithium level was high and she was given IV fluids and lithium held.   Assessment & Plan:   Principal Problem:   Delirium Active Problems:   Underweight due to inadequate caloric intake   Bipolar 1 disorder, depressed, moderate (HCC)   Dehydration   Multifocal pneumonia   Bronchiectasis (HCC)   Dementia with behavioral disturbance (HCC)   Elevated lithium level   Stage 3a chronic kidney disease (HCC)   Macrocytic anemia   Acute kidney injury superimposed on CKD ([place])  #1.  Failure to thrive. Anorexia. Severe protein calorie malnutrition. It appears that patient has been progressively getting weaker, frequent falls, with loss, poor appetite. She looks very frail with a severe malnutrition with BMI of 17.7. Given her advanced age, patient prognosis is very poor. She has been evaluated by palliative care, may consider hospice care as outpatient. Able to talk to patient husband, he want the daughter to make decision.  Has not been able to reach daughter.  #2. Acute toxic encephalopathy.  Due to Seroquel as well as lithium toxicity. Baseline dementia. Bipolar disorder. Lithium level has dropped down, currently patient is very confused, but not agitated.  Continue to follow.  3.  Multifocal pneumonia. Continue Rocephin and Zithromax for total course of 5 days.  4.  Acute kidney injury on chronic kidney disease stage II. Renal function had  improved  5.  Macrocytic anemia. Stable.  Addendum: [id number].  Daughter and patient son on the phone, had a long discussion.  Patient long-term prognosis is poor.  However, son believes that patient is back to her status medically, she was weighing less than 80 pounds a few months ago, her weight is up due to rehydration.  They are aware that patient has not been eating or drinking.  But they also states that patient has been doing like this for about a year.  They wanted to talk to psychiatry to start treatment for depression.  They believe patient is severely depressed, causing her not eating.  They are okay for patient to go to nursing home after psychiatry medicine is started.   DVT prophylaxis: Lovenox Code Status: Full Family Communication: Discussed with patient husband. Disposition Plan:  .   Status is: Inpatient  Remains inpatient appropriate because:Inpatient level of care appropriate due to severity of illness   Dispo: The patient is from: Home              Anticipated d/c is to: Home              Patient currently is not medically stable to d/c.   Difficult to place patient No        I/O last 3 completed shifts: In: 1434 [I.V.:1434] Out: 1050 [Urine:1050] No intake/output data recorded.     Consultants:   Palliative care, psychiatry  Procedures: None  Antimicrobials:  Zithromax.  Rocephin.  Subjective: Patient is very frail, confused, very weak.  She has very poor appetite, but  she has no nausea vomiting. Denies any short of breath, no cough. No fever or chills. Denies any dysuria hematuria.   Objective: Vitals:   02/24/21 1953 02/24/21 2351 02/25/21 0427 02/25/21 0918  BP: (!) 130/58 (!) 154/85 (!) 154/70 126/66  Pulse: 69 72 69 70  Resp: 18 20 20 16   Temp: 97.6 F (36.4 C) 97.6 F (36.4 C) 98.2 F (36.8 C) 98 F (36.7 C)  TempSrc: Oral Axillary Oral Oral  SpO2: 98% 99% 99% 98%  Weight:      Height:        Intake/Output Summary (Last 24  hours) at 02/25/2021 1033 Last data filed at 02/25/2021 2297 Gross per 24 hour  Intake 1434 ml  Output 650 ml  Net 784 ml   Filed Weights   02/20/21 1504  Weight: 39.9 kg    Examination:  General exam: Frail, severely malnourished, muscle atrophy, Respiratory system: Clear to auscultation. Respiratory effort normal. Cardiovascular system: S1 & S2 heard, RRR. No JVD, murmurs, rubs, gallops or clicks. No pedal edema. Gastrointestinal system: Abdomen is nondistended, soft and nontender. No organomegaly or masses felt. Normal bowel sounds heard. Central nervous system: Drowsy and confused. No focal neurological deficits. Extremities: Significant muscle atrophy. Skin: No rashes, lesions or ulcers Psychiatry: Very confused, no agitation.    Data Reviewed: I have personally reviewed following labs and imaging studies  CBC: Recent Labs  Lab 02/20/21 1519 02/22/21 0432 02/23/21 0438 02/24/21 0613 02/25/21 0433  WBC 12.3* 12.3* 13.1* 11.8* 12.3*  NEUTROABS 10.6*  --   --   --   --   HGB 11.9* 11.0* 12.1 13.1 13.4  HCT 38.1 34.6* 37.9 40.6 42.8  MCV 105.0* 105.5* 104.1* 104.1* 105.7*  PLT 297 281 293 260 989   Basic Metabolic Panel: Recent Labs  Lab 02/20/21 1519 02/22/21 0432 02/23/21 0438 02/24/21 0613 02/25/21 0433  NA 137 144 138 136 137  K 4.4 4.1 4.2 3.6 3.8  CL 109 115* 110 109 109  CO2 23 22 21* 22 22  GLUCOSE 89 108* 128* 106* 96  BUN 49* 26* 17 13 17   CREATININE 1.15* 1.10* 0.94 0.69 0.67  CALCIUM 9.7 9.3 9.0 8.6* 8.3*   GFR: Estimated Creatinine Clearance: 30 mL/min (by C-G formula based on SCr of 0.67 mg/dL). Liver Function Tests: Recent Labs  Lab 02/20/21 1519  AST 20  ALT 13  ALKPHOS 113  BILITOT 0.9  PROT 8.1  ALBUMIN 3.3*   No results for input(s): LIPASE, AMYLASE in the last 168 hours. No results for input(s): AMMONIA in the last 168 hours. Coagulation Profile: No results for input(s): INR, PROTIME in the last 168 hours. Cardiac  Enzymes: Recent Labs  Lab 02/23/21 0438  CKTOTAL 91   BNP (last 3 results) No results for input(s): PROBNP in the last 8760 hours. HbA1C: No results for input(s): HGBA1C in the last 72 hours. CBG: Recent Labs  Lab 02/23/21 1541  GLUCAP 116*   Lipid Profile: No results for input(s): CHOL, HDL, LDLCALC, TRIG, CHOLHDL, LDLDIRECT in the last 72 hours. Thyroid Function Tests: No results for input(s): TSH, T4TOTAL, FREET4, T3FREE, THYROIDAB in the last 72 hours. Anemia Panel: Recent Labs    02/23/21 0438  VITAMINB12 396   Sepsis Labs: Recent Labs  Lab 02/20/21 1519 02/22/21 0955  PROCALCITON 0.14 <0.10    Recent Results (from the past 240 hour(s))  Resp Panel by RT-PCR (Flu A&B, Covid) Nasopharyngeal Swab     Status: None   Collection Time:  02/20/21  4:39 PM   Specimen: Nasopharyngeal Swab; Nasopharyngeal(NP) swabs in vial transport medium  Result Value Ref Range Status   SARS Coronavirus 2 by RT PCR NEGATIVE NEGATIVE Final    Comment: (NOTE) SARS-CoV-2 target nucleic acids are NOT DETECTED.  The SARS-CoV-2 RNA is generally detectable in upper respiratory specimens during the acute phase of infection. The lowest concentration of SARS-CoV-2 viral copies this assay can detect is 138 copies/mL. A negative result does not preclude SARS-Cov-2 infection and should not be used as the sole basis for treatment or other patient management decisions. A negative result may occur with  improper specimen collection/handling, submission of specimen other than nasopharyngeal swab, presence of viral mutation(s) within the areas targeted by this assay, and inadequate number of viral copies(<138 copies/mL). A negative result must be combined with clinical observations, patient history, and epidemiological information. The expected result is Negative.  Fact Sheet for Patients:  EntrepreneurPulse.com.au  Fact Sheet for Healthcare Providers:   IncredibleEmployment.be  This test is no t yet approved or cleared by the [place] FDA and  has been authorized for detection and/or diagnosis of SARS-CoV-2 by FDA under an Emergency Use Authorization (EUA). This EUA will remain  in effect (meaning this test can be used) for the duration of the COVID-19 declaration under Section 564(b)(1) of the Act, 21 U.S.C.section 360bbb-3(b)(1), unless the authorization is terminated  or revoked sooner.       Influenza A by PCR NEGATIVE NEGATIVE Final   Influenza B by PCR NEGATIVE NEGATIVE Final    Comment: (NOTE) The Xpert Xpress SARS-CoV-2/FLU/RSV plus assay is intended as an aid in the diagnosis of influenza from Nasopharyngeal swab specimens and should not be used as a sole basis for treatment. Nasal washings and aspirates are unacceptable for Xpert Xpress SARS-CoV-2/FLU/RSV testing.  Fact Sheet for Patients: EntrepreneurPulse.com.au  Fact Sheet for Healthcare Providers: IncredibleEmployment.be  This test is not yet approved or cleared by the [place] FDA and has been authorized for detection and/or diagnosis of SARS-CoV-2 by FDA under an Emergency Use Authorization (EUA). This EUA will remain in effect (meaning this test can be used) for the duration of the COVID-19 declaration under Section 564(b)(1) of the Act, 21 U.S.C. section 360bbb-3(b)(1), unless the authorization is terminated or revoked.  Performed at [facility], [address] [place], [place] [address]   Aspergillus Ag, BAL/Serum     Status: None   Collection Time: 02/23/21  4:38 AM  Result Value Ref Range Status   Aspergillus Ag, BAL/Serum 0.02 0.00 - 0.49 Index Final    Comment: (NOTE) Performed At: [facility] [place], [place] [id number] Rush Farmer MD QI:[id number]          Radiology Studies: No results found.      Scheduled Meds: . bisacodyl  10  mg Rectal Daily  . enoxaparin (LOVENOX) injection  30 mg Subcutaneous Q24H  . melatonin  3 mg Oral QHS  . multivitamin with minerals   Oral Daily  . polyethylene glycol  17 g Oral Daily  . timolol  1 drop Both Eyes BID   Continuous Infusions: . azithromycin 500 mg (02/24/21 1538)  . cefTRIAXone (ROCEPHIN)  IV 2 g (02/24/21 1431)     LOS: 4 days    Time spent: 35 minutes    Sharen Hones, MD [facility]   To contact the attending provider between 7A-7P or the covering provider during after hours 7P-7A, please log into the web site [email or web address]  and access using universal [place] password for that web site. If you do not have the password, please call the hospital operator.  02/25/2021, 10:33 AM

## 2021-02-25 NOTE — Evaluation (Signed)
Occupational Therapy Evaluation Patient Details Name: Sierra Benson MRN: [id number] DOB: [birth year] Today's Date: 02/25/2021    History of Present Illness Pt is an 87 y.o. female presenting to hospital 3/4 with weakness, not sleeping for multiple nights, not eating/drinking, multiple falls, pain all over; family unable to take care of pt at home in current condition.  Per chart this is pt's 3rd visit for similar concerns and pt has been progressively declining.  PMH includes insomnia, mitral valve disorder, h/o bipolar 1 disorder, hemorrhage of anus and rectum, h/o palpitations, h/o eye sx, h/o foot sx.   Clinical Impression   Pt seen for OT evaluation this date in setting of acute hospitalization d/t weakness and falls. Pt is questionable historian who reports living with a spouse and having no difficulty with ADLs at baseline. Pt presents this date with confusion, gross weakness of trunk and limbs as well as decreased fxl activity tolerance. She requires MOD/MAX A to come to EOB sitting, intermittent MIN A to sustain static sit when fatigued, MAX/TOTAL A for sit to sup and stand attempted to no avail with one person assisting (PT required second person assisting to advance to standing). Pt with poor ability to motor plan or sequence steps of mobilizing in addition to her current weakness. In addition, pt requires MIN/MOD A for seated UB ADLs and MAX to TOTAL A for seated/bed level LB ADLs although she does demo improved command following and puts forth a reasonable effort. Will continue to follow acutely and anticipate pt will require f/u in STR setting with potential LTC needs (whether in home or facility) following rehab stay.    Follow Up Recommendations  SNF    Equipment Recommendations  Other (comment) (defer to next level of care)    Recommendations for Other Services       Precautions / Restrictions Precautions Precautions: Fall Restrictions Weight Bearing Restrictions: No       Mobility Bed Mobility Overal bed mobility: Needs Assistance Bed Mobility: Supine to Sit;Sit to Supine     Supine to sit: Mod assist;Max assist;HOB elevated Sit to supine: Max assist;Total assist   General bed mobility comments: MAX/TOTAL for back to bed to manage trunk and LEs in part d/t weakness and also partially d/t poor ability to sequence/motor plan.    Transfers                 General transfer comment: unable to assist pt to standing with 1p assist. Pt does make good effort, but is unable to sequence safe walker use with multiple cues and does not meaningfully contribute to lift-off/shifting weight forward.    Balance Overall balance assessment: Needs assistance Sitting-balance support: No upper extremity supported Sitting balance-Leahy Scale: Fair Sitting balance - Comments: UE support intermittently to maintain static sit, tolerates x10-12 minutes, noted to occasionally lean R, but appears to do so out of fatigue     Standing balance-Leahy Scale: Zero Standing balance comment: unsuccessful in attempting to come to stand with 1p assist.                           ADL either performed or assessed with clinical judgement   ADL Overall ADL's : Needs assistance/impaired                                       General ADL Comments: Pt  requires MIN/MOD A for seated UB ADLs, primarily with seated EOB UB ADLs-she requires assist d/t F/P static sitting balance. Pt requires MAX A for LB ADLs although she does make good effort to elevate L LE to don sock when prompted.     Vision   Additional Comments: unable to formally assess, pt appears to appropriately track when she is providing good visual attention for roughly ~75% of the session.     Perception     Praxis      Pertinent Vitals/Pain Pain Assessment: No/denies pain Faces Pain Scale: No hurt     Hand Dominance     Extremity/Trunk Assessment Upper Extremity Assessment Upper  Extremity Assessment: Generalized weakness;Difficult to assess due to impaired cognition   Lower Extremity Assessment Lower Extremity Assessment: Generalized weakness;Difficult to assess due to impaired cognition   Cervical / Trunk Assessment Cervical / Trunk Assessment: Kyphotic   Communication Communication Communication: Other (comment) (raspy and generally hypophonic)   Cognition Arousal/Alertness: Awake/alert Behavior During Therapy: Flat affect Overall Cognitive Status: No family/caregiver present to determine baseline cognitive functioning                                 General Comments: Pt with some moderate visual attn with OT this date, she is communicative and follows ~75% of simple on step commands. She is somewhat confused and disoriented still, states she is in the hospital, but does not know why and is not oriented to temporal concepts.   General Comments       Exercises     Shoulder Instructions      Home Living Family/patient expects to be discharged to:: Unsure                                 Additional Comments: Pt with limited communication with OT throughout session. She does seem somewhat more communicative than on initial PT evaluation. Does state that she lives with her spouse, unsure of efficacy as pt is confused.      Prior Functioning/Environment Level of Independence: Independent with assistive device(s)        Comments: Unsure of pt baseline function as she is relatively confused throughout and poor historian. She does state that she used a walker, but states that she was able to bathe herself and had "no problem taking care of myself".        OT Problem List: Decreased strength;Decreased range of motion;Decreased activity tolerance;Impaired balance (sitting and/or standing);Decreased cognition;Decreased safety awareness;Decreased knowledge of use of DME or AE      OT Treatment/Interventions: Self-care/ADL  training;DME and/or AE instruction;Therapeutic activities;Balance training;Therapeutic exercise;Patient/family education    OT Goals(Current goals can be found in the care plan section) Acute Rehab OT Goals Patient Stated Goal: to get out of here OT Goal Formulation: With patient Time For Goal Achievement: 03/11/21 Potential to Achieve Goals: Fair ADL Goals Pt Will Perform Grooming: with set-up;with supervision;sitting Pt Will Perform Upper Body Dressing: with supervision;with min guard assist;sitting Pt Will Perform Lower Body Dressing: with min assist;sitting/lateral leans Pt Will Transfer to Toilet: with mod assist;stand pivot transfer;bedside commode Pt Will Perform Toileting - Clothing Manipulation and hygiene: with mod assist;with max assist;sitting/lateral leans Pt/caregiver will Perform Home Exercise Program: Increased strength;Both right and left upper extremity;With minimal assist  OT Frequency: Min 1X/week   Barriers to D/C:  Co-evaluation              AM-PAC OT "6 Clicks" Daily Activity     Outcome Measure Help from another person eating meals?: A Little Help from another person taking care of personal grooming?: A Little Help from another person toileting, which includes using toliet, bedpan, or urinal?: A Lot Help from another person bathing (including washing, rinsing, drying)?: A Lot Help from another person to put on and taking off regular upper body clothing?: A Lot Help from another person to put on and taking off regular lower body clothing?: Total 6 Click Score: 13   End of Session Equipment Utilized During Treatment: Gait belt;Rolling walker Nurse Communication: Mobility status  Activity Tolerance: Patient tolerated treatment well Patient left: in bed;with call bell/phone within reach;with bed alarm set  OT Visit Diagnosis: Unsteadiness on feet (R26.81);Muscle weakness (generalized) (M62.81);Other symptoms and signs involving cognitive  function                Time: 1020-1101 OT Time Calculation (min): 41 min Charges:  OT General Charges $OT Visit: 1 Visit OT Evaluation $OT Eval Moderate Complexity: 1 Mod OT Treatments $Self Care/Home Management : 23-37 mins $Therapeutic Activity: 8-22 mins  Gerrianne Scale, MS, OTR/L ascom [phone] 02/25/21, 6:42 PM

## 2021-02-25 NOTE — Care Plan (Signed)
Pt has refused meals this shift. Pt did take 1 sip of tea at dinner. Asking for her daughter at this time.

## 2021-02-26 DIAGNOSIS — F0391 Unspecified dementia with behavioral disturbance: Secondary | ICD-10-CM | POA: Diagnosis not present

## 2021-02-26 DIAGNOSIS — N179 Acute kidney failure, unspecified: Secondary | ICD-10-CM | POA: Diagnosis not present

## 2021-02-26 DIAGNOSIS — R410 Disorientation, unspecified: Secondary | ICD-10-CM | POA: Diagnosis not present

## 2021-02-26 DIAGNOSIS — F3132 Bipolar disorder, current episode depressed, moderate: Secondary | ICD-10-CM | POA: Diagnosis not present

## 2021-02-26 LAB — CBC WITH DIFFERENTIAL/PLATELET
Abs Immature Granulocytes: 0.05 10*3/uL (ref 0.00–0.07)
Basophils Absolute: 0.1 10*3/uL (ref 0.0–0.1)
Basophils Relative: 1 %
Eosinophils Absolute: 0.4 10*3/uL (ref 0.0–0.5)
Eosinophils Relative: 4 %
HCT: 43.7 % (ref 36.0–46.0)
Hemoglobin: 13.5 g/dL (ref 12.0–15.0)
Immature Granulocytes: 1 %
Lymphocytes Relative: 14 %
Lymphs Abs: 1.3 10*3/uL (ref 0.7–4.0)
MCH: 33.6 pg (ref 26.0–34.0)
MCHC: 30.9 g/dL (ref 30.0–36.0)
MCV: 108.7 fL — ABNORMAL HIGH (ref 80.0–100.0)
Monocytes Absolute: 0.7 10*3/uL (ref 0.1–1.0)
Monocytes Relative: 7 %
Neutro Abs: 7.3 10*3/uL (ref 1.7–7.7)
Neutrophils Relative %: 73 %
Platelets: 228 10*3/uL (ref 150–400)
RBC: 4.02 MIL/uL (ref 3.87–5.11)
RDW: 15 % (ref 11.5–15.5)
WBC: 9.8 10*3/uL (ref 4.0–10.5)
nRBC: 0 % (ref 0.0–0.2)

## 2021-02-26 LAB — BASIC METABOLIC PANEL
Anion gap: 8 (ref 5–15)
BUN: 23 mg/dL (ref 8–23)
CO2: 20 mmol/L — ABNORMAL LOW (ref 22–32)
Calcium: 8.6 mg/dL — ABNORMAL LOW (ref 8.9–10.3)
Chloride: 111 mmol/L (ref 98–111)
Creatinine, Ser: 0.72 mg/dL (ref 0.44–1.00)
GFR, Estimated: >60 mL/min (ref >60)
Glucose, Bld: 59 mg/dL — ABNORMAL LOW (ref 70–99)
Potassium: 4.1 mmol/L (ref 3.5–5.1)
Sodium: 139 mmol/L (ref 135–145)

## 2021-02-26 LAB — GLUCOSE, CAPILLARY
Glucose-Capillary: 121 mg/dL — ABNORMAL HIGH (ref 70–99)
Glucose-Capillary: 137 mg/dL — ABNORMAL HIGH (ref 70–99)

## 2021-02-26 LAB — FUNGITELL, SERUM: Fungitell Result: <31 pg/mL (ref <80)

## 2021-02-26 LAB — MAGNESIUM: Magnesium: 2 mg/dL (ref 1.7–2.4)

## 2021-02-26 MED ORDER — MEGESTROL ACETATE 400 MG/10ML PO SUSP
400.0000 mg | Freq: Every day | ORAL | Status: DC
  Administered 2021-02-26: 400 mg via ORAL
  Filled 2021-02-26 (×4): qty 10

## 2021-02-26 MED ORDER — LITHIUM CARBONATE 150 MG PO CAPS
150.0000 mg | ORAL_CAPSULE | Freq: Every day | ORAL | Status: DC
  Administered 2021-02-26: 150 mg via ORAL
  Filled 2021-02-26 (×5): qty 1

## 2021-02-26 MED ORDER — POTASSIUM CHLORIDE 2 MEQ/ML IV SOLN
INTRAVENOUS | Status: DC
  Filled 2021-02-26 (×4): qty 1000

## 2021-02-26 MED ORDER — DEXTROSE 250 MG/ML IV SOLN: 12.5000 mg | Freq: Once | INTRAVENOUS | Status: DC

## 2021-02-26 MED ORDER — DEXTROSE 50 % IV SOLN: 12.5000 g | Freq: Once | INTRAVENOUS | Status: DC

## 2021-02-26 MED ORDER — LAMOTRIGINE 25 MG PO TABS
12.5000 mg | ORAL_TABLET | Freq: Every day | ORAL | Status: DC
  Administered 2021-02-26: 12.5 mg via ORAL
  Filled 2021-02-26 (×2): qty 1

## 2021-02-26 MED ORDER — POTASSIUM CHLORIDE 2 MEQ/ML IV SOLN: INTRAVENOUS | Status: DC

## 2021-02-26 MED ORDER — DEXTROSE 50 % IV SOLN
INTRAVENOUS | Status: AC
  Administered 2021-02-26: 25 mL
  Filled 2021-02-26: qty 50

## 2021-02-26 NOTE — Progress Notes (Signed)
Occupational Therapy Treatment Patient Details Name: Sierra Benson MRN: [id number] DOB: 07/16/1929 Today's Date: 02/26/2021    History of present illness Pt is an 87 y.o. female presenting to hospital 3/4 with weakness, not sleeping for multiple nights, not eating/drinking, multiple falls, pain all over; family unable to take care of pt at home in current condition.  Per chart this is pt's 3rd visit for similar concerns and pt has been progressively declining.  PMH includes insomnia, mitral valve disorder, h/o bipolar 1 disorder, hemorrhage of anus and rectum, h/o palpitations, h/o eye sx, h/o foot sx.   OT comments  Pt seen for OT treatment this date to f/u re: safety with ADLs. Pt in bed and OT adjusts positioning to high-fowler's to engage her in UB grooming tasks. Pt requires MAX A hand over hand, MAX multimodal cues to sequence washing her face, performing oral care, and applying lotion as well as re-direction to attend throughout. Pt appears to have somewhat decreased attn to task this date versus yesterday OT evaluation, however, could be d/t time of day versus assessment yesterday in AM. Will continue to follow, continue to anticipate that pt will require STR upon d/c.    Follow Up Recommendations  SNF    Equipment Recommendations  Other (comment) (defer)    Recommendations for Other Services      Precautions / Restrictions Precautions Precautions: Fall Restrictions Weight Bearing Restrictions: No       Mobility Bed Mobility Overal bed mobility: Needs Assistance Bed Mobility: Supine to Sit;Sit to Supine     Supine to sit: Total assist Sit to supine: Total assist   General bed mobility comments: deferred    Transfers                 General transfer comment: deferred    Balance                                           ADL either performed or assessed with clinical judgement   ADL Overall ADL's : Needs assistance/impaired      Grooming: Wash/dry face;Oral care;Maximal assistance;Bed level Grooming Details (indicate cue type and reason): high-folwer's position, hand over hand, MAX multimodal cues to sequence. Pt with very limtied task attention this date.                          Vision Patient Visual Report: No change from baseline     Perception     Praxis      Cognition Arousal/Alertness: Lethargic Behavior During Therapy: Flat affect Overall Cognitive Status: No family/caregiver present to determine baseline cognitive functioning                       General Comments: patient has difficulty maintaining alertness with eyes closed unless prompted to open eyes. patient has difficulty following commands this session        Exercises Exercises: General Lower Extremity General Exercises - Lower Extremity Ankle Circles/Pumps: PROM;Both;10 reps;Supine Heel Slides: PROM;Both;10 reps;Supine Straight Leg Raises: PROM;Both;10 reps;Supine Other Exercises Other Exercises: OT engages pt in UB grooming tasks for which pt requires increased time and cues to engage.   Shoulder Instructions       General Comments      Pertinent Vitals/ Pain       Pain Assessment: Faces Faces Pain  Scale: No hurt  Home Living                                          Prior Functioning/Environment              Frequency  Min 1X/week        Progress Toward Goals  OT Goals(current goals can now be found in the care plan section)  Progress towards OT goals: OT to reassess next treatment  Acute Rehab OT Goals Patient Stated Goal: none stated OT Goal Formulation: With patient Time For Goal Achievement: 03/11/21 Potential to Achieve Goals: [place] Discharge plan remains appropriate    Co-evaluation                 AM-PAC OT "6 Clicks" Daily Activity     Outcome Measure   Help from another person eating meals?: A Little Help from another person taking care of  personal grooming?: A Lot Help from another person toileting, which includes using toliet, bedpan, or urinal?: A Lot Help from another person bathing (including washing, rinsing, drying)?: A Lot Help from another person to put on and taking off regular upper body clothing?: A Lot Help from another person to put on and taking off regular lower body clothing?: Total 6 Click Score: 12    End of Session    OT Visit Diagnosis: Unsteadiness on feet (R26.81);Muscle weakness (generalized) (M62.81);Other symptoms and signs involving cognitive function   Activity Tolerance Patient limited by fatigue   Patient Left in bed;with call bell/phone within reach;with bed alarm set (telesitter)   Nurse Communication Mobility status        Time: 8325-4982 OT Time Calculation (min): 23 min  Charges: OT General Charges $OT Visit: 1 Visit OT Treatments $Self Care/Home Management : 23-37 mins  Gerrianne Scale, MS, OTR/L ascom [phone] 02/26/21, 5:46 PM

## 2021-02-26 NOTE — Care Plan (Signed)
Pt not eating breakfast at this time, is pocketing food. MD Roosevelt Locks made aware.

## 2021-02-26 NOTE — Progress Notes (Signed)
Physical Therapy Treatment Patient Details Name: Sierra Benson MRN: [id number] DOB: 1929/08/07 Today's Date: 02/26/2021    History of Present Illness Pt is an 87 y.o. female presenting to hospital 3/4 with weakness, not sleeping for multiple nights, not eating/drinking, multiple falls, pain all over; family unable to take care of pt at home in current condition.  Per chart this is pt's 3rd visit for similar concerns and pt has been progressively declining.  PMH includes insomnia, mitral valve disorder, h/o bipolar 1 disorder, hemorrhage of anus and rectum, h/o palpitations, h/o eye sx, h/o foot sx.    PT Comments    Patient is lethargic and has difficulty maintaining alertness for participation with mobility. Total assistance is required for bed mobility. Assisted patient to sitting up on edge of bed in attempts to increase alertness. Patient does open her eyes and answer questions briefly with no protective righting reactions or increased level of participation in sitting position. Patient does not participate with ROM of BLE despite encouragement. Recommend to continue PT to maximize independence and address functional limitations as patient is able to participate.     Follow Up Recommendations  SNF     Equipment Recommendations       Recommendations for Other Services       Precautions / Restrictions Precautions Precautions: Fall Restrictions Weight Bearing Restrictions: No    Mobility  Bed Mobility Overal bed mobility: Needs Assistance Bed Mobility: Supine to Sit;Sit to Supine     Supine to sit: Total assist Sit to supine: Total assist   General bed mobility comments: no activate participation with bed mobility efforts despite cues for technique    Transfers                    Ambulation/Gait                 Stairs             Wheelchair Mobility    Modified Rankin (Stroke Patients Only)       Balance                                             Cognition Arousal/Alertness: Lethargic Behavior During Therapy: Flat affect Overall Cognitive Status: No family/caregiver present to determine baseline cognitive functioning                                 General Comments: patient has difficulty maintaining alertness with eyes closed unless prompted to open eyes. patient has difficulty following commands this session      Exercises General Exercises - Lower Extremity Ankle Circles/Pumps: PROM;Both;10 reps;Supine Heel Slides: PROM;Both;10 reps;Supine Straight Leg Raises: PROM;Both;10 reps;Supine    General Comments        Pertinent Vitals/Pain Pain Assessment: No/denies pain    Home Living                      Prior Function            PT Goals (current goals can now be found in the care plan section) Acute Rehab PT Goals Patient Stated Goal: none stated PT Goal Formulation: Patient unable to participate in goal setting Time For Goal Achievement: 03/07/21 Potential to Achieve Goals: Fair Progress towards PT goals: Not progressing toward goals -  comment    Frequency    Min 2X/week      PT Plan Current plan remains appropriate    Co-evaluation              AM-PAC PT "6 Clicks" Mobility   Outcome Measure  Help needed turning from your back to your side while in a flat bed without using bedrails?: Total Help needed moving from lying on your back to sitting on the side of a flat bed without using bedrails?: Total Help needed moving to and from a bed to a chair (including a wheelchair)?: Total Help needed standing up from a chair using your arms (e.g., wheelchair or bedside chair)?: Total Help needed to walk in hospital room?: Total Help needed climbing 3-5 steps with a railing? : Total 6 Click Score: 6    End of Session   Activity Tolerance: Patient limited by lethargy Patient left: in bed;with call bell/phone within reach;with bed alarm set  (repositioned towards right side) Nurse Communication: Mobility status PT Visit Diagnosis: Other abnormalities of gait and mobility (R26.89);Unsteadiness on feet (R26.81);Muscle weakness (generalized) (M62.81);History of falling (Z91.81);Difficulty in walking, not elsewhere classified (R26.2);Other symptoms and signs involving the nervous system (R29.898)     Time: 4128-7867 PT Time Calculation (min) (ACUTE ONLY): 15 min  Charges:  $Therapeutic Activity: 8-22 mins                     Minna Merritts, PT, MPT    Percell Locus 02/26/2021, 2:11 PM

## 2021-02-26 NOTE — Progress Notes (Signed)
Hypoglycemic Event  CBG: lab blood draw 59  Treatment: Dextrose 25 ML given IV  Symptoms: none  Follow-up CBG: Time:0715 CBG Result:121  Possible Reasons for Event: patient is not taking in sufficient oral intake    Weston Anna

## 2021-02-26 NOTE — Progress Notes (Signed)
  Speech Language Pathology Treatment: Dysphagia  Patient Details Name: Sierra Benson MRN: [id number] DOB: 1929/05/18 Today's Date: 02/26/2021 Time: 1005-1030 SLP Time Calculation (min) (ACUTE ONLY): 25 min  Assessment / Plan / Recommendation Clinical Impression  Per chart, pt was observed pocketing food this morning and MD reports that pt has started spitting out food and medicine.   Pt was awake and required gentle assistance for repositioning in the bed. Pt noted to have open mouth breathing (this has not be observed before while pt was awake). Pt was agreeable to ice chips however she produced ~ 10 swallows per 1 ice chip, immediate wet vocal quality and immediate coughing. Pt was agreeable to applesauce. However, pt had great difficulty with bolus management as bolus could be visualized in bilateral corners of mouth. After an extended oral phase, she did swallow bolus. These oral phase characteristics are likely related to pt's cognitive decline. At this time, recommend continue current diet as pt tolerates. Pt continues to be at a high risk of aspiration, dehydration and malnutrition.   ST intervention is no longer indicated as pt has not demonstrated any ability to further advance her diet.     HPI HPI: Sierra Benson is a 87 y.o. female with medical history significant for bipolar disorder who was brought into the emergency room by EMS for evaluation of mental status changes that have been provessive over the last couple of weeks. Her husband states that she has been awake for 2 days straight and has been wandering around the house, very irritable and agitated.  Oral intake is very poor and patient has lost about 10 pounds in the last 1 month.  She had a CT scan of the chest with contrast to rule out a PE which shows progression and parenchymal findings from 2018.  Findings are most consistent with progression of chronic indolent infection with MAI.  She is also noted to have right heart  dilatation on CT scan. Patient will be admitted to the hospital for delirium and has been seen by psychiatry.      SLP Plan  All goals met       Recommendations  Diet recommendations: Dysphagia 1 (puree);Nectar-thick liquid Liquids provided via: Cup Medication Administration: Crushed with puree Supervision:  (total assist) Compensations: Minimize environmental distractions;Slow rate;Small sips/bites Postural Changes and/or Swallow Maneuvers: Seated upright 90 degrees;Upright 30-60 min after meal                Oral Care Recommendations: Oral care BID Follow up Recommendations: Skilled Nursing facility SLP Visit Diagnosis: Dysphagia, unspecified (R13.10);Cognitive communication deficit (R41.841) Plan: All goals met       GO               Happi B. Rutherford Nail M.S., CCC-SLP, [place] Office [phone]  Happi Rutherford Nail 02/26/2021, 11:21 AM

## 2021-02-26 NOTE — TOC Progression Note (Signed)
Transition of Care [facility]) - Progression Note    Patient Details  Name: Sierra Benson MRN: [id number] Date of Birth: 12-02-1929  [facility]) CM/SW Contact  Beverly Sessions, RN Phone Number: 02/26/2021, 1:56 PM  Clinical Narrative:     3 way call completed with son and daughter.  They are aware that they are currently no bed offers for SNF  They still prefer inpatient psych.  I have notified them that if the provider recommends inpatient psych then at that time I can send clinical information out to see what options are available.    Son Sierra Benson states they will be speaking with provider   Expected Discharge Plan: [place] Barriers to Discharge: Continued Medical Work up  Expected Discharge Plan and Services Expected Discharge Plan: [place] arrangements for the past 2 months: Single Family Home                                       Social Determinants of Health (SDOH) Interventions    Readmission Risk Interventions No flowsheet data found.

## 2021-02-26 NOTE — TOC Progression Note (Signed)
Transition of Care [facility]) - Progression Note    Patient Details  Name: Sierra Benson MRN: [id number] Date of Birth: [birth year]  Transition of Care [facility]) CM/SW Contact  Beverly Sessions, RN Phone Number: 02/26/2021, 10:39 AM  Clinical Narrative:      No bed offers.  Search extended  Expected Discharge Plan: [place] Barriers to Discharge: Continued Medical Work up  Expected Discharge Plan and Services Expected Discharge Plan: [place] arrangements for the past 2 months: Single Family Home                                       Social Determinants of Health (SDOH) Interventions    Readmission Risk Interventions No flowsheet data found.

## 2021-02-26 NOTE — Progress Notes (Signed)
Pulmonary Medicine          Date: 02/26/2021,   MRN# [id number] Sierra Benson 23-Sep-1929     AdmissionWeight: 39.9 kg                 CurrentWeight: 39.9 kg   Referring physician: Dr. Francine Graven   CHIEF COMPLAINT:   Progressive multifocal pneumonia with bronchiectasis mediastinal and hilar adenopathy   HISTORY OF PRESENT ILLNESS   This is a pleasant 87 year old female with a history of psychiatric disorder including bipolar, came in for confusion with altered mental status, husband states she has had poor p.o. nutrition and weight loss over the last 1 month has been on her normal antidepression medications,Labs show sodium 137, potassium 4.4, chloride 109, bicarb 23, glucose 89, BUN 49, creatinine 1.15, calcium 9.7, alkaline phosphatase 113, albumin 3.3, AST 20, ALT 13, total protein 8.1, total CK 8, procalcitonin 0.14, white count 12.3, hemoglobin 11.9, hematocrit 38.1, MCV 105, RDW 15.4, platelet count 297, lithium 1.32, TSH 2.5, T4 1.08 CT scan of abdomen and pelvis shows moderate large volume of stool throughout the colon with stool distending the rectum, suggesting fecal impaction. Mild stranding of the perirectal fat. No obstruction or perforation. Trace free fluid in the pelvis is likely reactive. Mild contrast refluxing into the hepatic veins and IVC, can be seen with right heart failure. CT angiogram of the chest shows no pulmonary embolus. Progressive multifocal bronchiectasis with multifocal nodular, tree-in-bud, and consolidative airspace opacities throughout both lungs. Definite progression in parenchymal findings from 2018. Findings are most consistent with progression of chronic indolent  infection, Mycobacterium avium complex. Cardiomegaly with primarily right heart dilatation. Mild reflux of contrast into the IVC consistent with elevated right heart pressures. Mild mediastinal and right hilar adenopathy is likely reactive. Trace left pleural effusion. Cervical  spine CT shows no acute cervical spine fracture. Multilevel cervical spondylosis unchanged. Patchy bilateral upper lobe airspace disease. CT scan of the head without contrast shows no acute intracranial process.  PCCM consultation for additional evaluation managed    02/23/21- patient is stable, she is only on 1L/min [place].  Mentation remains an issue with mostly sleeping but opens eyes to verbal communication. Infectious workup in process.   02/24/21- patient opening eyes spontaneously and mumbles words which is improvement from prior. WBC count trending down.  Presumably AMS due to pscyhiatric meds  02/25/21- patient is significantly improved, she is speaking in full sentences and is on room air.  I will re-attempt to collect resp cultures since her chest imaging is terrible.  PAST MEDICAL HISTORY   Past Medical History:  Diagnosis Date  . Bacterial pneumonia 11/2015  . Cystocele   . Glaucoma   . Insomnia   . Menopausal state   . Mitral valve disorder   . Nocturia   . Prediabetes 06/19/2015  . Rectocele   . Vaginal atrophy   . Varicose veins   . Vitamin D deficiency      SURGICAL HISTORY   Past Surgical History:  Procedure Laterality Date  . BREAST BIOPSY Right    benign nodule  . EYE SURGERY Left    cataract removed  . FOOT SURGERY    . VAGINAL HYSTERECTOMY     menorrhagia     FAMILY HISTORY   Family History  Problem Relation Age of Onset  . Diabetes Brother   . Lung cancer Brother   . Breast cancer Sister   . Ovarian cancer Neg Hx   . Colon  cancer Neg Hx   . Heart disease Neg Hx      SOCIAL HISTORY   Social History   Tobacco Use  . Smoking status: Former Research scientist (life sciences)  . Smokeless tobacco: Never Used  Vaping Use  . Vaping Use: Never used  Substance Use Topics  . Alcohol use: No  . Drug use: No     MEDICATIONS    Home Medication:    Current Medication:  Current Facility-Administered Medications:  .  acetaminophen (TYLENOL) tablet 650 mg, 650 mg, Oral,  Q6H PRN **OR** acetaminophen (TYLENOL) suppository 650 mg, 650 mg, Rectal, Q6H PRN, Agbata, Tochukwu, MD .  bisacodyl (DULCOLAX) suppository 10 mg, 10 mg, Rectal, Daily, Leslye Peer, Richard, MD, 10 mg at 02/25/21 0921 .  dextrose 50 % solution 12.5 g, 12.5 g, Intravenous, Once, Belue, Alver Sorrow, RPH .  enoxaparin (LOVENOX) injection 30 mg, 30 mg, Subcutaneous, Q24H, Agbata, Tochukwu, MD, 30 mg at 02/25/21 2037 .  haloperidol lactate (HALDOL) injection 1 mg, 1 mg, Intramuscular, Q8H PRN, Salley Scarlet, MD, 1 mg at 02/22/21 0252 .  melatonin tablet 5 mg, 5 mg, Oral, QHS, Sharen Hones, MD, 5 mg at 02/25/21 2032 .  multivitamin with minerals tablet, , Oral, Daily, Agbata, Tochukwu, MD .  ondansetron (ZOFRAN) tablet 4 mg, 4 mg, Oral, Q6H PRN **OR** ondansetron (ZOFRAN) injection 4 mg, 4 mg, Intravenous, Q6H PRN, Agbata, Tochukwu, MD .  polyethylene glycol (MIRALAX / GLYCOLAX) packet 17 g, 17 g, Oral, Daily, Jari Pigg, Mary E, MD .  timolol (TIMOPTIC) 0.25 % ophthalmic solution 1 drop, 1 drop, Both Eyes, BID, Wieting, Richard, MD, 1 drop at 02/25/21 2036    ALLERGIES   Prednisone, Propoxyphene, Celecoxib, and Ibuprofen     REVIEW OF SYSTEMS    Review of Systems:  Gen:  Denies  fever, sweats, chills weigh loss  HEENT: Denies blurred vision, double vision, ear pain, eye pain, hearing loss, nose bleeds, sore throat Cardiac:  No dizziness, chest pain or heaviness, chest tightness,edema Resp:   Denies cough or sputum porduction, shortness of breath,wheezing, hemoptysis,  Gi: Denies swallowing difficulty, stomach pain, nausea or vomiting, diarrhea, constipation, bowel incontinence Gu:  Denies bladder incontinence, burning urine Ext:   Denies Joint pain, stiffness or swelling Skin: Denies  skin rash, easy bruising or bleeding or hives Endoc:  Denies polyuria, polydipsia , polyphagia or weight change Psych:   Denies depression, insomnia or hallucinations   Other:  All other systems negative   VS:  BP (!) 148/63 (BP Location: Right Arm)   Pulse 72   Temp 97.6 F (36.4 C) (Oral)   Resp 16   Ht _0  (1.499 m)   Wt 39.9 kg   SpO2 94%   BMI 17.77 kg/m      PHYSICAL EXAM    GENERAL:NAD, no fevers, chills, no weakness no fatigue HEAD: Normocephalic, atraumatic.  EYES: Pupils equal, round, reactive to light. Extraocular muscles intact. No scleral icterus.  MOUTH: Moist mucosal membrane. Dentition intact. No abscess noted.  EAR, NOSE, THROAT: Clear without exudates. No external lesions.  NECK: Supple. No thyromegaly. No nodules. No JVD.  PULMONARY: rhonchi bilaterally CARDIOVASCULAR: S1 and S2. Regular rate and rhythm. No murmurs, rubs, or gallops. No edema. Pedal pulses 2+ bilaterally.  GASTROINTESTINAL: Soft, nontender, nondistended. No masses. Positive bowel sounds. No hepatosplenomegaly.  MUSCULOSKELETAL: No swelling, clubbing, or edema. Range of motion full in all extremities.  NEUROLOGIC: Cranial nerves II through XII are intact. No gross focal neurological deficits. Sensation intact. Reflexes intact.  SKIN: No ulceration, lesions, rashes, or cyanosis. Skin warm and dry. Turgor intact.  PSYCHIATRIC: Mood, affect within normal limits. The patient is awake, alert and oriented x 3. Insight, judgment intact.       IMAGING    CT Head Wo Contrast  Result Date: 02/20/2021 CLINICAL DATA:  Head trauma EXAM: CT HEAD WITHOUT CONTRAST TECHNIQUE: Contiguous axial images were obtained from the base of the skull through the vertex without intravenous contrast. COMPARISON:  01/12/2021 FINDINGS: Brain: No acute infarct or hemorrhage. Lateral ventricles and midline structures are stable. No acute extra-axial fluid collections. No mass effect. Vascular: No hyperdense vessel or unexpected calcification. Skull: Normal. Negative for fracture or focal lesion. Sinuses/Orbits: Minimal fluid within the right maxillary sinus. Remaining paranasal sinuses are clear. Postsurgical changes left orbit.  Other: None. IMPRESSION: 1. No acute intracranial process. 2. Minimal right maxillary sinus disease. Electronically Signed   By: Randa Ngo M.D.   On: 02/20/2021 19:04   CT Angio Chest PE W and/or Wo Contrast  Result Date: 02/20/2021 CLINICAL DATA:  PE suspected, high prob No additional relevant history provided or available. EXAM: CT ANGIOGRAPHY CHEST WITH CONTRAST TECHNIQUE: Multidetector CT imaging of the chest was performed using the standard protocol during bolus administration of intravenous contrast. Multiplanar CT image reconstructions and MIPs were obtained to evaluate the vascular anatomy. CONTRAST:  80m OMNIPAQUE IOHEXOL 350 MG/ML SOLN COMPARISON:  Most recent chest radiograph 01/12/2021. Chest CT 11/03/2017 FINDINGS: Cardiovascular: There are no filling defects within the pulmonary arteries to suggest pulmonary embolus. Mild aortic atherosclerosis and tortuosity. Cardiomegaly with primarily right heart dilatation. Mild reflux of contrast into the IVC. No pericardial effusion. Mediastinum/Nodes: 12 mm right hilar node, comparison to priors difficult due to differences in technique. No left hilar adenopathy. 13 mm prominent subcarinal node. No thyroid nodule. Patulous esophagus. Lungs/Pleura: Multifocal bronchiectasis that is progressed from prior exam. Areas of bronchial filling involving the right lower lobe. There are multifocal nodular, tree-in-bud, and consolidative airspace opacities throughout both lungs. Many of these consolidative areas are calcified or high-density. Overall interval progression from 2018 CT. Similar trace left pleural effusion. No findings of pulmonary edema. Upper Abdomen: Assessed on concurrent abdominal CT, reported separately. Musculoskeletal: Scoliosis and exaggerated thoracic kyphosis. Multilevel degenerative change in the spine. There are no acute or suspicious osseous abnormalities. Review of the MIP images confirms the above findings. IMPRESSION: 1. No pulmonary  embolus. 2. Progressive multifocal bronchiectasis with multifocal nodular, tree-in-bud, and consolidative airspace opacities throughout both lungs. Definite progression in parenchymal findings from 2018. Findings are most consistent with progression of chronic indolent infection infection, Mycobacterium avium complex. 3. Cardiomegaly with primarily right heart dilatation. Mild reflux of contrast into the IVC consistent with elevated right heart pressures. 4. Mild mediastinal and right hilar adenopathy is likely reactive. 5. Trace left pleural effusion. Aortic Atherosclerosis (ICD10-I70.0). Electronically Signed   By: MKeith RakeM.D.   On: 02/20/2021 19:14   CT Cervical Spine Wo Contrast  Result Date: 02/20/2021 CLINICAL DATA:  Neck trauma EXAM: CT CERVICAL SPINE WITHOUT CONTRAST TECHNIQUE: Multidetector CT imaging of the cervical spine was performed without intravenous contrast. Multiplanar CT image reconstructions were also generated. COMPARISON:  03/03/2011 FINDINGS: Alignment: Minimal anterolisthesis of C4 relative to C5, stable. Otherwise alignment is anatomic. Skull base and vertebrae: No acute fracture. No primary bone lesion or focal pathologic process. Soft tissues and spinal canal: No prevertebral fluid or swelling. No visible canal hematoma. Disc levels: There is mild diffuse cervical spondylosis greatest at C5-6. Prominent  facet hypertrophic changes greatest at C3-4 and C4-5. Upper chest: Airway is patent. Patchy areas of airspace disease are seen at the apices. No effusion or pneumothorax. Other: Reconstructed images demonstrate no additional findings. IMPRESSION: 1. No acute cervical spine fracture. 2. Multilevel cervical spondylosis unchanged. 3. Patchy bilateral upper lobe airspace disease. Electronically Signed   By: Randa Ngo M.D.   On: 02/20/2021 19:08   CT ABDOMEN PELVIS W CONTRAST  Result Date: 02/20/2021 CLINICAL DATA:  Abdominal distension. EXAM: CT ABDOMEN AND PELVIS WITH  CONTRAST TECHNIQUE: Multidetector CT imaging of the abdomen and pelvis was performed using the standard protocol following bolus administration of intravenous contrast. CONTRAST:  72m OMNIPAQUE IOHEXOL 350 MG/ML SOLN COMPARISON:  No prior abdominal imaging. Chest CT performed concurrently, reported separately. FINDINGS: Lower chest: Assessed on concurrent chest CT, reported separately. Hepatobiliary: Mild contrast refluxing into the hepatic veins and IVC. No focal hepatic lesion. Unremarkable gallbladder. No calcified gallstone or findings of cholecystitis. Pancreas: Parenchymal atrophy. Pancreas is partially obscured by motion. No evidence of pancreatic inflammation, ductal dilatation or mass. Spleen: Normal in size without focal abnormality. Adrenals/Urinary Tract: No adrenal nodule. No hydronephrosis or perinephric edema. Small left renal cyst. Symmetric excretion on delayed phase imaging. Urinary bladder is unremarkable. Stomach/Bowel: Bowel evaluation is limited in the absence of enteric contrast, paucity of intra-abdominal fat, and patient motion. Stomach is grossly unremarkable. There is no small bowel dilatation or evidence of obstruction. Moderate large volume of stool throughout the colon. There is stool distending the rectum. Mild stranding of the perirectal fat. Appendix not confidently visualized, no evidence of appendicitis. Vascular/Lymphatic: Aortic atherosclerosis and tortuosity. Patent portal vein. No enlarged abdominopelvic lymph nodes. Reproductive: Hysterectomy.  No evidence of adnexal mass. Other: Trace free fluid in the pelvis. No free air or focal abscess. No abdominal wall hernia. Musculoskeletal: Scoliosis and degenerative change in the spine. There are no acute or suspicious osseous abnormalities. IMPRESSION: 1. Moderate large volume of stool throughout the colon with stool distending the rectum, suggesting fecal impaction. Mild stranding of the perirectal fat. No obstruction or  perforation. 2. Trace free fluid in the pelvis is likely reactive. 3. Mild contrast refluxing into the hepatic veins and IVC, can be seen with right heart failure. Aortic Atherosclerosis (ICD10-I70.0). Electronically Signed   By: MKeith RakeM.D.   On: 02/20/2021 19:17   ECHOCARDIOGRAM COMPLETE  Result Date: 02/24/2021    ECHOCARDIOGRAM REPORT   Patient Name:   Sierra ELMANDate of Exam: 02/23/2021 Medical Rec #:  [id number]        Height:       59.0 in Accession #:    [id number]       Weight:       88.0 lb Date of Birth:  [birth year]         BSA:          1.301 m Patient Age:    89years          BP:           177/99 mmHg Patient Gender: F                 HR:           98 bpm. Exam Location:  [facility] Procedure: 2D Echo, Cardiac Doppler and Color Doppler Indications:     CHF-acute diastolic IU38.45 History:         Patient has no prior history of Echocardiogram examinations.  Mitral valve disorder.  Sonographer:     Sherrie Sport RDCS (AE) Referring Phys:  [id number] Royce Macadamia AGBATA Diagnosing Phys: Yolonda Kida MD  Sonographer Comments: Technically challenging study due to limited acoustic windows, no apical window and no subcostal window. Image acquisition challenging due to patient body habitus and Pt lying on right side in fetal position and is non -verbal. IMPRESSIONS  1. RV failure / severely dilated severely reduced RVF.  2. Left ventricular ejection fraction, by estimation, is 70 to 75%. The left ventricle has hyperdynamic function. The left ventricle has no regional wall motion abnormalities. There is moderate left ventricular hypertrophy. Left ventricular diastolic parameters are consistent with Grade III diastolic dysfunction (restrictive).  3. Right ventricular systolic function is severely reduced. The right ventricular size is moderately enlarged.  4. Left atrial size was mildly dilated.  5. Right atrial size was mildly dilated.  6. The mitral valve is grossly normal. No evidence  of mitral valve regurgitation.  7. The aortic valve is grossly normal. Aortic valve regurgitation is not visualized. Mild to moderate aortic valve sclerosis/calcification is present, without any evidence of aortic stenosis. FINDINGS  Left Ventricle: Left ventricular ejection fraction, by estimation, is 70 to 75%. The left ventricle has hyperdynamic function. The left ventricle has no regional wall motion abnormalities. The left ventricular internal cavity size was normal in size. There is moderate left ventricular hypertrophy. Left ventricular diastolic parameters are consistent with Grade III diastolic dysfunction (restrictive). Right Ventricle: The right ventricular size is moderately enlarged. No increase in right ventricular wall thickness. Right ventricular systolic function is severely reduced. Left Atrium: Left atrial size was mildly dilated. Right Atrium: Right atrial size was mildly dilated. Pericardium: There is no evidence of pericardial effusion. Mitral Valve: The mitral valve is grossly normal. No evidence of mitral valve regurgitation. Tricuspid Valve: The tricuspid valve is normal in structure. Tricuspid valve regurgitation is not demonstrated. Aortic Valve: The aortic valve is grossly normal. Aortic valve regurgitation is not visualized. Mild to moderate aortic valve sclerosis/calcification is present, without any evidence of aortic stenosis. Pulmonic Valve: The pulmonic valve was normal in structure. Pulmonic valve regurgitation is not visualized. Aorta: The ascending aorta was not well visualized. IAS/Shunts: No atrial level shunt detected by color flow Doppler. Additional Comments: RV failure / severely dilated severely reduced RVF.  LEFT VENTRICLE PLAX 2D LVIDd:         2.30 cm LVIDs:         1.40 cm LV PW:         0.88 cm LV IVS:        1.68 cm LVOT diam:     2.00 cm LVOT Area:     3.14 cm  LEFT ATRIUM         Index LA diam:    2.50 cm 1.92 cm/m                        PULMONIC VALVE AORTA                  PV Vmax:        0.55 m/s Ao Root diam: 3.20 cm PV Peak grad:   1.2 mmHg                       RVOT Peak grad: 1 mmHg  TRICUSPID VALVE TR Peak grad:   14.9 mmHg TR Vmax:        193.00 cm/s  SHUNTS Systemic Diam: 2.00 cm Yolonda Kida MD Electronically signed by Yolonda Kida MD Signature Date/Time: 02/24/2021/6:24:24 PM    Final       ASSESSMENT/PLAN   Respiratory distress with chronic pnemonia    - patient with confusion unclear if septic encephalopathy due to pna vs medication related from psych disease  - patient is unable to provide respiratory cultures due to AMS  - she is 87yo with severe cachexia and significant comorbid history, will ask palliative care to evaluate. - COVID19 negative  - supplemental O2 during my evaluation -room air - will perform infectious workup for pneumonia -Respiratory viral panel -serum fungitell -legionella ab -strep pneumoniae ur AG -Histoplasma Ur Ag -sputum resp cultures -AFB sputum expectorated specimen -reviewed pertinent imaging with patient today - ESR -AFR and aspergillus workup if patient is able    Thank you for allowing me to participate in the care of this patient.   Patient/Family are satisfied with care plan and all questions have been answered.  This document was prepared using Dragon voice recognition software and may include unintentional dictation errors.     Ottie Glazier, M.D.  Division of [place]

## 2021-02-26 NOTE — Progress Notes (Signed)
PROGRESS NOTE    Sierra Benson  DJS:[id number] DOB: 11-22-1929 DOA: 02/20/2021 PCP: Idelle Crouch, MD    Brief Narrative:  Patient has a past medical history of bipolar disorder and psychosis. Patient admitted with acute delirium and altered mental status. Patient has been given IV fluids. Patient was started on Seroquel as outpatient and since then has not been doing well. She was started empirically on Rocephin and Zithromax for multifocal pneumonia seen on CT scan. Initially her lithium level was high and she was given IV fluids and lithium held.   Assessment & Plan:   Principal Problem:   Delirium Active Problems:   Underweight due to inadequate caloric intake   Bipolar 1 disorder, depressed, moderate (HCC)   Dehydration   Multifocal pneumonia   Bronchiectasis (HCC)   Dementia with behavioral disturbance (HCC)   Elevated lithium level   Stage 3a chronic kidney disease (HCC)   Macrocytic anemia   Acute kidney injury superimposed on CKD ([place])   Failure to thrive in adult   Severe protein-calorie malnutrition ([place])  #1.  Failure to thrive. Anorexia. Severe protein calorie malnutrition. Hypoglycemia due to poor p.o. intake Patient still not eating, she spits out medicine and food. Due to concern of dehydration, I will restart fluids to include some D5 water as patient also developed some hypoglycemia. Additionally, I will start megestrol to try to stimulate her appetite.  2.  Acute toxic encephalopathy. Probable baseline dementia. Bipolar disorder. Family request the lithium to be restarted, while we will leave that decision to Dr. Weber Cooks. Patient continue request to be transferred to [facility] unit.  #3.  Multifocal pneumonia P Patient pleaded 5 days antibiotics.  4.  Acute kidney injury.  Improved  5.  Macrocytic anemia Stable.    DVT prophylaxis: Lovenox Code Status: Full Family Communication: Discussed with patient daughter again Disposition  Plan:  .   Status is: Inpatient  Remains inpatient appropriate because:Inpatient level of care appropriate due to severity of illness   Dispo: The patient is from: Home              Anticipated d/c is to: Home              Patient currently is not medically stable to d/c.   Difficult to place patient No        I/O last 3 completed shifts: In: 5 [P.O.:5] Out: 950 [Urine:950] No intake/output data recorded.     Consultants:   Psychiatry  Procedures: None  Antimicrobials: None  Subjective: Patient still not eating, she has some hypoglycemia episodes.  She spitted out medicine and foods. She does not have any short of breath or cough. She does not have any dysuria hematuria No fever or chills.  Objective: Vitals:   02/25/21 1619 02/25/21 1950 02/26/21 0630 02/26/21 0841  BP: (!) 173/76 133/69 (!) 148/63 140/76  Pulse: 75 73 72 74  Resp: 15 18 16 17   Temp: 97.7 F (36.5 C) 97.6 F (36.4 C)  97.8 F (36.6 C)  TempSrc: Oral Oral  Oral  SpO2: 99% 98% 94% 98%  Weight:      Height:        Intake/Output Summary (Last 24 hours) at 02/26/2021 1050 Last data filed at 02/26/2021 0841 Gross per 24 hour  Intake 5 ml  Output 300 ml  Net -295 ml   Filed Weights   02/20/21 1504  Weight: 39.9 kg    Examination:  General exam: Appears calm and comfortable,  appears severely malnourished. Respiratory system: Clear to auscultation. Respiratory effort normal. Cardiovascular system: S1 & S2 heard, RRR. No JVD, murmurs, rubs, gallops or clicks. No pedal edema. Gastrointestinal system: Abdomen is nondistended, soft and nontender. No organomegaly or masses felt. Normal bowel sounds heard. Central nervous system: Alert and oriented x1. No focal neurological deficits. Extremities: Symmetric  Skin: No rashes, lesions or ulcers     Data Reviewed: I have personally reviewed following labs and imaging studies  CBC: Recent Labs  Lab 02/20/21 1519 02/22/21 0432  02/23/21 0438 02/24/21 0613 02/25/21 0433 02/26/21 0452  WBC 12.3* 12.3* 13.1* 11.8* 12.3* 9.8  NEUTROABS 10.6*  --   --   --   --  7.3  HGB 11.9* 11.0* 12.1 13.1 13.4 13.5  HCT 38.1 34.6* 37.9 40.6 42.8 43.7  MCV 105.0* 105.5* 104.1* 104.1* 105.7* 108.7*  PLT 297 281 293 260 234 099   Basic Metabolic Panel: Recent Labs  Lab 02/22/21 0432 02/23/21 0438 02/24/21 0613 02/25/21 0433 02/26/21 0452  NA 144 138 136 137 139  K 4.1 4.2 3.6 3.8 4.1  CL 115* 110 109 109 111  CO2 22 21* 22 22 20*  GLUCOSE 108* 128* 106* 96 59*  BUN 26* 17 13 17 23   CREATININE 1.10* 0.94 0.69 0.67 0.72  CALCIUM 9.3 9.0 8.6* 8.3* 8.6*  MG  --   --   --   --  2.0   GFR: Estimated Creatinine Clearance: 30 mL/min (by C-G formula based on SCr of 0.72 mg/dL). Liver Function Tests: Recent Labs  Lab 02/20/21 1519  AST 20  ALT 13  ALKPHOS 113  BILITOT 0.9  PROT 8.1  ALBUMIN 3.3*   No results for input(s): LIPASE, AMYLASE in the last 168 hours. No results for input(s): AMMONIA in the last 168 hours. Coagulation Profile: No results for input(s): INR, PROTIME in the last 168 hours. Cardiac Enzymes: Recent Labs  Lab 02/23/21 0438  CKTOTAL 91   BNP (last 3 results) No results for input(s): PROBNP in the last 8760 hours. HbA1C: No results for input(s): HGBA1C in the last 72 hours. CBG: Recent Labs  Lab 02/23/21 1541 02/26/21 0715 02/26/21 0719  GLUCAP 116* 121* 137*   Lipid Profile: No results for input(s): CHOL, HDL, LDLCALC, TRIG, CHOLHDL, LDLDIRECT in the last 72 hours. Thyroid Function Tests: No results for input(s): TSH, T4TOTAL, FREET4, T3FREE, THYROIDAB in the last 72 hours. Anemia Panel: No results for input(s): VITAMINB12, FOLATE, FERRITIN, TIBC, IRON, RETICCTPCT in the last 72 hours. Sepsis Labs: Recent Labs  Lab 02/20/21 1519 02/22/21 0955  PROCALCITON 0.14 <0.10    Recent Results (from the past 240 hour(s))  Resp Panel by RT-PCR (Flu A&B, Covid) Nasopharyngeal Swab      Status: None   Collection Time: 02/20/21  4:39 PM   Specimen: Nasopharyngeal Swab; Nasopharyngeal(NP) swabs in vial transport medium  Result Value Ref Range Status   SARS Coronavirus 2 by RT PCR NEGATIVE NEGATIVE Final    Comment: (NOTE) SARS-CoV-2 target nucleic acids are NOT DETECTED.  The SARS-CoV-2 RNA is generally detectable in upper respiratory specimens during the acute phase of infection. The lowest concentration of SARS-CoV-2 viral copies this assay can detect is 138 copies/mL. A negative result does not preclude SARS-Cov-2 infection and should not be used as the sole basis for treatment or other patient management decisions. A negative result may occur with  improper specimen collection/handling, submission of specimen other than nasopharyngeal swab, presence of viral mutation(s) within the areas  targeted by this assay, and inadequate number of viral copies(<138 copies/mL). A negative result must be combined with clinical observations, patient history, and epidemiological information. The expected result is Negative.  Fact Sheet for Patients:  EntrepreneurPulse.com.au  Fact Sheet for Healthcare Providers:  IncredibleEmployment.be  This test is no t yet approved or cleared by the [place] FDA and  has been authorized for detection and/or diagnosis of SARS-CoV-2 by FDA under an Emergency Use Authorization (EUA). This EUA will remain  in effect (meaning this test can be used) for the duration of the COVID-19 declaration under Section 564(b)(1) of the Act, 21 U.S.C.section 360bbb-3(b)(1), unless the authorization is terminated  or revoked sooner.       Influenza A by PCR NEGATIVE NEGATIVE Final   Influenza B by PCR NEGATIVE NEGATIVE Final    Comment: (NOTE) The Xpert Xpress SARS-CoV-2/FLU/RSV plus assay is intended as an aid in the diagnosis of influenza from Nasopharyngeal swab specimens and should not be used as a sole basis  for treatment. Nasal washings and aspirates are unacceptable for Xpert Xpress SARS-CoV-2/FLU/RSV testing.  Fact Sheet for Patients: EntrepreneurPulse.com.au  Fact Sheet for Healthcare Providers: IncredibleEmployment.be  This test is not yet approved or cleared by the [place] FDA and has been authorized for detection and/or diagnosis of SARS-CoV-2 by FDA under an Emergency Use Authorization (EUA). This EUA will remain in effect (meaning this test can be used) for the duration of the COVID-19 declaration under Section 564(b)(1) of the Act, 21 U.S.C. section 360bbb-3(b)(1), unless the authorization is terminated or revoked.  Performed at [facility], [address] [place], [place] [address]   Aspergillus Ag, BAL/Serum     Status: None   Collection Time: 02/23/21  4:38 AM  Result Value Ref Range Status   Aspergillus Ag, BAL/Serum 0.02 0.00 - 0.49 Index Final    Comment: (NOTE) Performed At: [facility] [place], [place] [id number] Rush Farmer MD TS:[id number]          Radiology Studies: No results found.      Scheduled Meds: . bisacodyl  10 mg Rectal Daily  . dextrose  12.5 g Intravenous Once  . enoxaparin (LOVENOX) injection  30 mg Subcutaneous Q24H  . megestrol  400 mg Oral Daily  . melatonin  5 mg Oral QHS  . multivitamin with minerals   Oral Daily  . timolol  1 drop Both Eyes BID   Continuous Infusions: . dextrose 5 % lactated ringers with KCl/Additives Pediatric custom IV fluid       LOS: 5 days    Time spent: 34 minutes, than 50% of time spent on direct patient care.    Sharen Hones, MD [facility]   To contact the attending provider between 7A-7P or the covering provider during after hours 7P-7A, please log into the web site [email or web address] and access using universal [place] password for that web site. If you do not have the password, please call the hospital  operator.  02/26/2021, 10:50 AM

## 2021-02-26 NOTE — Consult Note (Signed)
[facility] Consult   Reason for Consult: Follow-up consult 87 year old woman with a history of bipolar disorder who continues to be weak confused and not eating well. Referring Physician:  [place] Patient Identification: Sierra Benson MRN:  [id number] Principal Diagnosis: Delirium Diagnosis:  Principal Problem:   Delirium Active Problems:   Underweight due to inadequate caloric intake   Bipolar 1 disorder, depressed, moderate (HCC)   Dehydration   Multifocal pneumonia   Bronchiectasis (HCC)   Dementia with behavioral disturbance (HCC)   Elevated lithium level   Stage 3a chronic kidney disease (HCC)   Macrocytic anemia   Acute kidney injury superimposed on CKD ([place])   Failure to thrive in adult   Severe protein-calorie malnutrition ([place])   Total Time spent with patient: 30 minutes  Subjective:   Sierra Benson is a 87 y.o. female patient admitted with "I feel very weak".  HPI: Patient seen chart reviewed.  87 year old woman in a hospital bed awake when I came in.  Patient was pleasant with me but not clear to me that she remembered our conversations.  I asked her if she knew where she was and she insisted she was at her home and was very surprised when I told her it was the hospital.  Patient says she is feeling very weak and tired.  She also says multiple times that she is hungry.  I pointed out to her that it is documented that she has been spitting food out or holding it in her mouth.  Patient has no explanation for that.  She says she does not feel exactly like what she would call "depressed" and denies any suicidal thought.  Past Psychiatric History: Patient has a long history of bipolar disorder with both depressed and manic phases in the past.  As far as I can tell she is primarily been treated with lithium over time and no one seems to remember any other medication.  Risk to Self:   Risk to Others:   Prior Inpatient Therapy:   Prior Outpatient Therapy:     Past Medical History:  Past Medical History:  Diagnosis Date  . Bacterial pneumonia 11/2015  . Cystocele   . Glaucoma   . Insomnia   . Menopausal state   . Mitral valve disorder   . Nocturia   . Prediabetes 06/19/2015  . Rectocele   . Vaginal atrophy   . Varicose veins   . Vitamin D deficiency     Past Surgical History:  Procedure Laterality Date  . BREAST BIOPSY Right    benign nodule  . EYE SURGERY Left    cataract removed  . FOOT SURGERY    . VAGINAL HYSTERECTOMY     menorrhagia   Family History:  Family History  Problem Relation Age of Onset  . Diabetes Brother   . Lung cancer Brother   . Breast cancer Sister   . Ovarian cancer Neg Hx   . Colon cancer Neg Hx   . Heart disease Neg Hx    Family Psychiatric  History: See previous Social History:  Social History   Substance and Sexual Activity  Alcohol Use No     Social History   Substance and Sexual Activity  Drug Use No    Social History   Socioeconomic History  . Marital status: Married    Spouse name: Not on file  . Number of children: Not on file  . Years of education: Not on file  . Highest education level:  Not on file  Occupational History  . Not on file  Tobacco Use  . Smoking status: Former Research scientist (life sciences)  . Smokeless tobacco: Never Used  Vaping Use  . Vaping Use: Never used  Substance and Sexual Activity  . Alcohol use: No  . Drug use: No  . Sexual activity: Yes    Birth control/protection: Surgical  Other Topics Concern  . Not on file  Social History Narrative  . Not on file   Social Determinants of Health   Financial Resource Strain: Not on file  Food Insecurity: Not on file  Transportation Needs: Not on file  Physical Activity: Not on file  Stress: Not on file  Social Connections: Not on file   Additional Social History:    Allergies:   Allergies  Allergen Reactions  . Prednisone Swelling and Other (See Comments)    Insomnia   . Propoxyphene Hives  . Celecoxib Nausea  And Vomiting  . Ibuprofen Nausea And Vomiting    Labs:  Results for orders placed or performed during the hospital encounter of 02/20/21 (from the past 48 hour(s))  Lithium level     Status: Abnormal   Collection Time: 02/25/21  4:33 AM  Result Value Ref Range   Lithium Lvl 0.21 (L) 0.60 - 1.20 mmol/L    Comment: Performed at [facility], [address] [place], [place] [address]  CBC     Status: Abnormal   Collection Time: 02/25/21  4:33 AM  Result Value Ref Range   WBC 12.3 (H) 4.0 - 10.5 K/uL   RBC 4.05 3.87 - 5.11 MIL/uL   Hemoglobin 13.4 12.0 - 15.0 g/dL   HCT 42.8 36.0 - 46.0 %   MCV 105.7 (H) 80.0 - 100.0 fL   MCH 33.1 26.0 - 34.0 pg   MCHC 31.3 30.0 - 36.0 g/dL   RDW 15.0 11.5 - 15.5 %   Platelets 234 150 - 400 K/uL   nRBC 0.0 0.0 - 0.2 %    Comment: Performed at [facility], [address] [place], [place] [address]  Basic metabolic panel     Status: Abnormal   Collection Time: 02/25/21  4:33 AM  Result Value Ref Range   Sodium 137 135 - 145 mmol/L   Potassium 3.8 3.5 - 5.1 mmol/L   Chloride 109 98 - 111 mmol/L   CO2 22 22 - 32 mmol/L   Glucose, Bld 96 70 - 99 mg/dL    Comment: Glucose reference range applies only to samples taken after fasting for at least 8 hours.   BUN 17 8 - 23 mg/dL   Creatinine, Ser 0.67 0.44 - 1.00 mg/dL   Calcium 8.3 (L) 8.9 - 10.3 mg/dL   GFR, Estimated >60 >60 mL/min    Comment: (NOTE) Calculated using the CKD-EPI Creatinine Equation (2021)    Anion gap 6 5 - 15    Comment: Performed at [facility], [address] [place], [place] [address]  CBC with Differential/Platelet     Status: Abnormal   Collection Time: 02/26/21  4:52 AM  Result Value Ref Range   WBC 9.8 4.0 - 10.5 K/uL   RBC 4.02 3.87 - 5.11 MIL/uL   Hemoglobin 13.5 12.0 - 15.0 g/dL   HCT 43.7 36.0 - 46.0 %   MCV 108.7 (H) 80.0 - 100.0 fL   MCH 33.6 26.0 - 34.0 pg   MCHC 30.9 30.0 - 36.0 g/dL   RDW 15.0 11.5 - 15.5 %   Platelets  228  150 - 400 K/uL   nRBC 0.0 0.0 - 0.2 %   Neutrophils Relative % 73 %   Neutro Abs 7.3 1.7 - 7.7 K/uL   Lymphocytes Relative 14 %   Lymphs Abs 1.3 0.7 - 4.0 K/uL   Monocytes Relative 7 %   Monocytes Absolute 0.7 0.1 - 1.0 K/uL   Eosinophils Relative 4 %   Eosinophils Absolute 0.4 0.0 - 0.5 K/uL   Basophils Relative 1 %   Basophils Absolute 0.1 0.0 - 0.1 K/uL   Immature Granulocytes 1 %   Abs Immature Granulocytes 0.05 0.00 - 0.07 K/uL    Comment: Performed at [facility], [address] [place], [place] [address]  Basic metabolic panel     Status: Abnormal   Collection Time: 02/26/21  4:52 AM  Result Value Ref Range   Sodium 139 135 - 145 mmol/L   Potassium 4.1 3.5 - 5.1 mmol/L   Chloride 111 98 - 111 mmol/L   CO2 20 (L) 22 - 32 mmol/L   Glucose, Bld 59 (L) 70 - 99 mg/dL    Comment: Glucose reference range applies only to samples taken after fasting for at least 8 hours.   BUN 23 8 - 23 mg/dL   Creatinine, Ser 0.72 0.44 - 1.00 mg/dL   Calcium 8.6 (L) 8.9 - 10.3 mg/dL   GFR, Estimated >60 >60 mL/min    Comment: (NOTE) Calculated using the CKD-EPI Creatinine Equation (2021)    Anion gap 8 5 - 15    Comment: Performed at [facility], [address] [place], [place] [address]  Magnesium     Status: None   Collection Time: 02/26/21  4:52 AM  Result Value Ref Range   Magnesium 2.0 1.7 - 2.4 mg/dL    Comment: Performed at [facility], [address] [place], [place] [address]  Glucose, capillary     Status: Abnormal   Collection Time: 02/26/21  7:15 AM  Result Value Ref Range   Glucose-Capillary 121 (H) 70 - 99 mg/dL    Comment: Glucose reference range applies only to samples taken after fasting for at least 8 hours.  Glucose, capillary     Status: Abnormal   Collection Time: 02/26/21  7:19 AM  Result Value Ref Range   Glucose-Capillary 137 (H) 70 - 99 mg/dL    Comment: Glucose reference range applies only to samples taken after fasting for at  least 8 hours.    Current Facility-Administered Medications  Medication Dose Route Frequency Provider Last Rate Last Admin  . acetaminophen (TYLENOL) tablet 650 mg  650 mg Oral Q6H PRN Agbata, Tochukwu, MD       Or  . acetaminophen (TYLENOL) suppository 650 mg  650 mg Rectal Q6H PRN Agbata, Tochukwu, MD      . bisacodyl (DULCOLAX) suppository 10 mg  10 mg Rectal Daily Loletha Grayer, MD   10 mg at 02/26/21 0843  . dextrose 5% lactated ringers 1,000 mL with potassium chloride 10 mEq infusion   Intravenous Continuous Sharen Hones, MD 50 mL/hr at 02/26/21 1125 New Bag at 02/26/21 1125  . dextrose 50 % solution 12.5 g  12.5 g Intravenous Once Renda Rolls, RPH      . enoxaparin (LOVENOX) injection 30 mg  30 mg Subcutaneous Q24H Agbata, Tochukwu, MD   30 mg at 02/25/21 2037  . haloperidol lactate (HALDOL) injection 1 mg  1 mg Intramuscular Q8H PRN Salley Scarlet, MD   1 mg  at 02/22/21 0252  . lithium carbonate capsule 150 mg  150 mg Oral QHS Abrielle Finck T, MD      . megestrol (MEGACE) 400 MG/10ML suspension 400 mg  400 mg Oral Daily Sharen Hones, MD   400 mg at 02/26/21 1127  . melatonin tablet 5 mg  5 mg Oral QHS Sharen Hones, MD   5 mg at 02/25/21 2032  . multivitamin with minerals tablet   Oral Daily Agbata, Tochukwu, MD      . ondansetron (ZOFRAN) tablet 4 mg  4 mg Oral Q6H PRN Agbata, Tochukwu, MD       Or  . ondansetron (ZOFRAN) injection 4 mg  4 mg Intravenous Q6H PRN Agbata, Tochukwu, MD      . timolol (TIMOPTIC) 0.25 % ophthalmic solution 1 drop  1 drop Both Eyes BID Loletha Grayer, MD   1 drop at 02/26/21 1027    Musculoskeletal: Strength & Muscle Tone: decreased Gait & Station: unsteady Patient leans: N/A            Psychiatric Specialty Exam:  Presentation  General Appearance: No data recorded Eye Contact:No data recorded Speech:No data recorded Speech Volume:No data recorded Handedness:No data recorded  Mood and Affect  Mood:No data  recorded Affect:No data recorded  Thought Process  Thought Processes:No data recorded Descriptions of Associations:No data recorded Orientation:No data recorded Thought Content:No data recorded History of Schizophrenia/Schizoaffective disorder:No data recorded Duration of Psychotic Symptoms:No data recorded Hallucinations:No data recorded Ideas of Reference:No data recorded Suicidal Thoughts:No data recorded Homicidal Thoughts:No data recorded  Sensorium  Memory:No data recorded Judgment:No data recorded Insight:No data recorded  Executive Functions  Concentration:No data recorded Attention Span:No data recorded Recall:No data recorded Fund of Knowledge:No data recorded Language:No data recorded  Psychomotor Activity  Psychomotor Activity:No data recorded  Assets  Assets:No data recorded  Sleep  Sleep:No data recorded  Physical Exam: Physical Exam Vitals and nursing note reviewed.  Constitutional:      Appearance: Normal appearance.  HENT:     Head: Normocephalic and atraumatic.     Mouth/Throat:     Pharynx: Oropharynx is clear.  Eyes:     Pupils: Pupils are equal, round, and reactive to light.  Cardiovascular:     Rate and Rhythm: Normal rate and regular rhythm.  Pulmonary:     Effort: Pulmonary effort is normal.     Breath sounds: Normal breath sounds.  Abdominal:     General: Abdomen is flat.     Palpations: Abdomen is soft.  Musculoskeletal:        General: Normal range of motion.  Skin:    General: Skin is warm and dry.  Neurological:     General: No focal deficit present.     Mental Status: She is alert. Mental status is at baseline.  Psychiatric:        Attention and Perception: She is inattentive.        Mood and Affect: Affect is blunt.        Speech: Speech is delayed.        Behavior: Behavior is slowed.        Thought Content: Thought content normal.        Cognition and Memory: Cognition is impaired. Memory is impaired. She exhibits  impaired recent memory.    Review of Systems  Constitutional: Positive for malaise/fatigue.  HENT: Negative.   Eyes: Negative.   Respiratory: Negative.   Cardiovascular: Negative.   Gastrointestinal: Negative.   Musculoskeletal: Negative.  Skin: Negative.   Neurological: Negative.   Psychiatric/Behavioral: Positive for memory loss. Negative for depression, hallucinations and suicidal ideas. The patient is not nervous/anxious.    Blood pressure (!) 151/74, pulse 73, temperature 97.9 F (36.6 C), resp. rate 16, height 4\' 11"  (1.499 m), weight 39.9 kg, SpO2 98 %. Body mass index is 17.77 kg/m.  Treatment Plan Summary: Medication management and Plan Patient seen chart reviewed.  Patient is awake with mental status as described above.  To me she seems more encephalopathic than depressed but it certainly true that it can be quite difficult to tell the difference especially in older and physically debilitated people and that she definitely has a history of mental health problems.  I discussed with her the way that treatment for depression and bipolar disorder differs from regular depression.  I am going to restart her lithium at only 150 mg which is half of what she was taking when she came in.  I also think we should try starting lamotrigine with an initial dose perhaps of only 12-1/2 mg if possible for bipolar depression.  She had been on Seroquel previously but the amount of sedation and potential for heart arrhythmia for that is concerning to me.  I have put in a note to social work that I am not opposed to referring her to a geriatric psychiatry unit.  It is certainly possible that they may be able to improve her condition beyond where she is now.  Right now she is needing a great deal of nursing intervention.  Disposition: Recommend psychiatric Inpatient admission when medically cleared. Supportive therapy provided about ongoing stressors. Discussed crisis plan, support from social network,  calling 911, coming to the Emergency Department, and calling Suicide Hotline.  Alethia Berthold, MD 02/26/2021 5:01 PM

## 2021-02-27 DIAGNOSIS — E43 Unspecified severe protein-calorie malnutrition: Secondary | ICD-10-CM

## 2021-02-27 DIAGNOSIS — R627 Adult failure to thrive: Secondary | ICD-10-CM | POA: Diagnosis not present

## 2021-02-27 DIAGNOSIS — F3132 Bipolar disorder, current episode depressed, moderate: Secondary | ICD-10-CM | POA: Diagnosis not present

## 2021-02-27 DIAGNOSIS — R410 Disorientation, unspecified: Secondary | ICD-10-CM | POA: Diagnosis not present

## 2021-02-27 DIAGNOSIS — J189 Pneumonia, unspecified organism: Secondary | ICD-10-CM | POA: Diagnosis not present

## 2021-02-27 DIAGNOSIS — R1310 Dysphagia, unspecified: Secondary | ICD-10-CM

## 2021-02-27 LAB — BASIC METABOLIC PANEL
Anion gap: 6 (ref 5–15)
BUN: 24 mg/dL — ABNORMAL HIGH (ref 8–23)
CO2: 23 mmol/L (ref 22–32)
Calcium: 8.5 mg/dL — ABNORMAL LOW (ref 8.9–10.3)
Chloride: 110 mmol/L (ref 98–111)
Creatinine, Ser: 0.77 mg/dL (ref 0.44–1.00)
GFR, Estimated: >60 mL/min (ref >60)
Glucose, Bld: 97 mg/dL (ref 70–99)
Potassium: 4.2 mmol/L (ref 3.5–5.1)
Sodium: 139 mmol/L (ref 135–145)

## 2021-02-27 LAB — MAGNESIUM: Magnesium: 2 mg/dL (ref 1.7–2.4)

## 2021-02-27 LAB — PHOSPHORUS: Phosphorus: 2.5 mg/dL (ref 2.5–4.6)

## 2021-02-27 NOTE — Progress Notes (Signed)
DNR bractlet verified with Julie,RN & limits reviewed during shift change.

## 2021-02-27 NOTE — Progress Notes (Signed)
Physical Therapy Treatment Patient Details Name: Sierra Benson MRN: [id number] DOB: [birth year] Today's Date: 02/27/2021    History of Present Illness Pt is an 87 y.o. female presenting to hospital 3/4 with weakness, not sleeping for multiple nights, not eating/drinking, multiple falls, pain all over; family unable to take care of pt at home in current condition.  Per chart this is pt's 3rd visit for similar concerns and pt has been progressively declining.  PMH includes insomnia, mitral valve disorder, h/o bipolar 1 disorder, hemorrhage of anus and rectum, h/o palpitations, h/o eye sx, h/o foot sx.    PT Comments    Patient received in bed, sleeping. Lethargic. Mumbles at times, but difficult to understand. She does not follow direction for mobility at this time. Patient is total assist for bed mobility currently. Appears to be declining from initial PT evaluation. Will continue to see for short time to see if patient is able to participate in exercises or mobility. If unable will discontinue services.      Follow Up Recommendations  Other (comment) (Long term care/hospice)     Equipment Recommendations  None recommended by PT    Recommendations for Other Services       Precautions / Restrictions Precautions Precautions: Fall Restrictions Weight Bearing Restrictions: No    Mobility  Bed Mobility Overal bed mobility: Needs Assistance Bed Mobility: Supine to Sit;Sit to Supine     Supine to sit: Total assist Sit to supine: Total assist   General bed mobility comments: patient unable to assist with amny mobility at this time    Transfers                 General transfer comment: deferred  Ambulation/Gait             General Gait Details: unable to attempt at this time   Stairs             Wheelchair Mobility    Modified Rankin (Stroke Patients Only)       Balance Overall balance assessment: Needs assistance   Sitting balance-Leahy Scale:  Zero Sitting balance - Comments: patient with lethargy and inability to assist with mobility                                    Cognition Arousal/Alertness: Lethargic Behavior During Therapy: Flat affect Overall Cognitive Status: No family/caregiver present to determine baseline cognitive functioning                                 General Comments: patient has difficulty maintaining alertness with eyes closed unless prompted to open eyes. patient has difficulty following commands this session      Exercises Other Exercises Other Exercises: Passive exercises to B LEs: AP, knee flexion extension x 10 reps each. Patient does not attempt to perform with cues.    General Comments        Pertinent Vitals/Pain Pain Assessment: Faces Faces Pain Scale: Hurts little more Pain Location: general discomfort with movement of LEs Pain Descriptors / Indicators: Sore;Guarding;Grimacing Pain Intervention(s): Monitored during session;Repositioned;Limited activity within patient's tolerance    Home Living                      Prior Function            PT Goals (current goals  can now be found in the care plan section) Acute Rehab PT Goals Patient Stated Goal: none stated PT Goal Formulation: Patient unable to participate in goal setting Time For Goal Achievement: 03/07/21 Progress towards PT goals: Not progressing toward goals - comment;Goals downgraded-see care plan    Frequency    Min 2X/week      PT Plan Discharge plan needs to be updated    Co-evaluation              AM-PAC PT "6 Clicks" Mobility   Outcome Measure  Help needed turning from your back to your side while in a flat bed without using bedrails?: Total Help needed moving from lying on your back to sitting on the side of a flat bed without using bedrails?: Total Help needed moving to and from a bed to a chair (including a wheelchair)?: Total Help needed standing up from a  chair using your arms (e.g., wheelchair or bedside chair)?: Total Help needed to walk in hospital room?: Total Help needed climbing 3-5 steps with a railing? : Total 6 Click Score: 6    End of Session   Activity Tolerance: Patient limited by lethargy Patient left: in bed;with bed alarm set Nurse Communication: Mobility status PT Visit Diagnosis: Other abnormalities of gait and mobility (R26.89);Muscle weakness (generalized) (M62.81);History of falling (Z91.81);Other symptoms and signs involving the nervous system (R29.898)     Time: 1315-1330 PT Time Calculation (min) (ACUTE ONLY): 15 min  Charges:  $Therapeutic Exercise: 8-22 mins                     Daishaun Ayre, PT, GCS 02/27/21,1:38 PM

## 2021-02-27 NOTE — Care Management Important Message (Signed)
Important Message  Patient Details  Name: Sierra Benson MRN: [id number] Date of Birth: 1929-04-29   Medicare Important Message Given:  Yes     Dannette Barbara 02/27/2021, 11:16 AM

## 2021-02-27 NOTE — Consult Note (Signed)
[facility] Psychiatry Consult   Reason for Consult: Follow-up patient with dementia and history of bipolar disorder Referring Physician:  Ro[place] Patient Identification: Sierra Benson MRN:  [id number] Principal Diagnosis: Delirium Diagnosis:  Principal Problem:   Delirium Active Problems:   Underweight due to inadequate caloric intake   Bipolar 1 disorder, depressed, moderate (HCC)   Dehydration   Multifocal pneumonia   Bronchiectasis (HCC)   Dementia with behavioral disturbance (HCC)   Elevated lithium level   Stage 3a chronic kidney disease (HCC)   Macrocytic anemia   Acute kidney injury superimposed on CKD ([place])   Failure to thrive in adult   Severe protein-calorie malnutrition ([place])   Dysphagia   Total Time spent with patient: 30 minutes  Subjective:   Sierra Benson is a 87 y.o. female patient admitted with patient is currently not able to communicate.  HPI: Came by to see patient for follow-up.  She was asleep this afternoon.  Attempted to arouse her by saying her name several times loudly.  Patient was asleep with her mouth wide open and at one point seemed to have a little bit of a response to my voice but did not wake up.  Patient still not taking p.o.  Fluids are at the moment being held because of concern about shortness of breath.  Unable of course to assess anything directly about mood.  Past Psychiatric History: History of bipolar disorder with long history of stability on lithium.  Risk to Self:   Risk to Others:   Prior Inpatient Therapy:   Prior Outpatient Therapy:    Past Medical History:  Past Medical History:  Diagnosis Date  . Bacterial pneumonia 11/2015  . Cystocele   . Glaucoma   . Insomnia   . Menopausal state   . Mitral valve disorder   . Nocturia   . Prediabetes 06/19/2015  . Rectocele   . Vaginal atrophy   . Varicose veins   . Vitamin D deficiency     Past Surgical History:  Procedure Laterality Date  . BREAST BIOPSY Right     benign nodule  . EYE SURGERY Left    cataract removed  . FOOT SURGERY    . VAGINAL HYSTERECTOMY     menorrhagia   Family History:  Family History  Problem Relation Age of Onset  . Diabetes Brother   . Lung cancer Brother   . Breast cancer Sister   . Ovarian cancer Neg Hx   . Colon cancer Neg Hx   . Heart disease Neg Hx    Family Psychiatric  History: See previous Social History:  Social History   Substance and Sexual Activity  Alcohol Use No     Social History   Substance and Sexual Activity  Drug Use No    Social History   Socioeconomic History  . Marital status: Married    Spouse name: Not on file  . Number of children: Not on file  . Years of education: Not on file  . Highest education level: Not on file  Occupational History  . Not on file  Tobacco Use  . Smoking status: Former Research scientist (life sciences)  . Smokeless tobacco: Never Used  Vaping Use  . Vaping Use: Never used  Substance and Sexual Activity  . Alcohol use: No  . Drug use: No  . Sexual activity: Yes    Birth control/protection: Surgical  Other Topics Concern  . Not on file  Social History Narrative  . Not on file  Social Determinants of Health   Financial Resource Strain: Not on file  Food Insecurity: Not on file  Transportation Needs: Not on file  Physical Activity: Not on file  Stress: Not on file  Social Connections: Not on file   Additional Social History:    Allergies:   Allergies  Allergen Reactions  . Prednisone Swelling and Other (See Comments)    Insomnia   . Propoxyphene Hives  . Celecoxib Nausea And Vomiting  . Ibuprofen Nausea And Vomiting    Labs:  Results for orders placed or performed during the hospital encounter of 02/20/21 (from the past 48 hour(s))  CBC with Differential/Platelet     Status: Abnormal   Collection Time: 02/26/21  4:52 AM  Result Value Ref Range   WBC 9.8 4.0 - 10.5 K/uL   RBC 4.02 3.87 - 5.11 MIL/uL   Hemoglobin 13.5 12.0 - 15.0 g/dL   HCT 43.7  36.0 - 46.0 %   MCV 108.7 (H) 80.0 - 100.0 fL   MCH 33.6 26.0 - 34.0 pg   MCHC 30.9 30.0 - 36.0 g/dL   RDW 15.0 11.5 - 15.5 %   Platelets 228 150 - 400 K/uL   nRBC 0.0 0.0 - 0.2 %   Neutrophils Relative % 73 %   Neutro Abs 7.3 1.7 - 7.7 K/uL   Lymphocytes Relative 14 %   Lymphs Abs 1.3 0.7 - 4.0 K/uL   Monocytes Relative 7 %   Monocytes Absolute 0.7 0.1 - 1.0 K/uL   Eosinophils Relative 4 %   Eosinophils Absolute 0.4 0.0 - 0.5 K/uL   Basophils Relative 1 %   Basophils Absolute 0.1 0.0 - 0.1 K/uL   Immature Granulocytes 1 %   Abs Immature Granulocytes 0.05 0.00 - 0.07 K/uL    Comment: Performed at [facility], [address] [place], [place] [address]  Basic metabolic panel     Status: Abnormal   Collection Time: 02/26/21  4:52 AM  Result Value Ref Range   Sodium 139 135 - 145 mmol/L   Potassium 4.1 3.5 - 5.1 mmol/L   Chloride 111 98 - 111 mmol/L   CO2 20 (L) 22 - 32 mmol/L   Glucose, Bld 59 (L) 70 - 99 mg/dL    Comment: Glucose reference range applies only to samples taken after fasting for at least 8 hours.   BUN 23 8 - 23 mg/dL   Creatinine, Ser 0.72 0.44 - 1.00 mg/dL   Calcium 8.6 (L) 8.9 - 10.3 mg/dL   GFR, Estimated >60 >60 mL/min    Comment: (NOTE) Calculated using the CKD-EPI Creatinine Equation (2021)    Anion gap 8 5 - 15    Comment: Performed at [facility], [address] [place], [place] [address]  Magnesium     Status: None   Collection Time: 02/26/21  4:52 AM  Result Value Ref Range   Magnesium 2.0 1.7 - 2.4 mg/dL    Comment: Performed at [facility], [address] [place], [place] [address]  Glucose, capillary     Status: Abnormal   Collection Time: 02/26/21  7:15 AM  Result Value Ref Range   Glucose-Capillary 121 (H) 70 - 99 mg/dL    Comment: Glucose reference range applies only to samples taken after fasting for at least 8 hours.  Glucose, capillary     Status: Abnormal   Collection Time: 02/26/21  7:19 AM  Result  Value Ref Range   Glucose-Capillary 137 (H) 70 -  99 mg/dL    Comment: Glucose reference range applies only to samples taken after fasting for at least 8 hours.  Basic metabolic panel     Status: Abnormal   Collection Time: 02/27/21  4:19 AM  Result Value Ref Range   Sodium 139 135 - 145 mmol/L   Potassium 4.2 3.5 - 5.1 mmol/L   Chloride 110 98 - 111 mmol/L   CO2 23 22 - 32 mmol/L   Glucose, Bld 97 70 - 99 mg/dL    Comment: Glucose reference range applies only to samples taken after fasting for at least 8 hours.   BUN 24 (H) 8 - 23 mg/dL   Creatinine, Ser 0.77 0.44 - 1.00 mg/dL   Calcium 8.5 (L) 8.9 - 10.3 mg/dL   GFR, Estimated >60 >60 mL/min    Comment: (NOTE) Calculated using the CKD-EPI Creatinine Equation (2021)    Anion gap 6 5 - 15    Comment: Performed at [facility], [address] [place], [place] [address]  Magnesium     Status: None   Collection Time: 02/27/21  4:19 AM  Result Value Ref Range   Magnesium 2.0 1.7 - 2.4 mg/dL    Comment: Performed at [facility], [address] [place], [place] [address]  Phosphorus     Status: None   Collection Time: 02/27/21  4:19 AM  Result Value Ref Range   Phosphorus 2.5 2.5 - 4.6 mg/dL    Comment: Performed at [facility], [address], [place], [place] [address]    Current Facility-Administered Medications  Medication Dose Route Frequency Provider Last Rate Last Admin  . acetaminophen (TYLENOL) tablet 650 mg  650 mg Oral Q6H PRN Agbata, Tochukwu, MD       Or  . acetaminophen (TYLENOL) suppository 650 mg  650 mg Rectal Q6H PRN Agbata, Tochukwu, MD      . bisacodyl (DULCOLAX) suppository 10 mg  10 mg Rectal Daily Loletha Grayer, MD   10 mg at 02/27/21 0919  . enoxaparin (LOVENOX) injection 30 mg  30 mg Subcutaneous Q24H Agbata, Tochukwu, MD   30 mg at 02/26/21 2234  . haloperidol lactate (HALDOL) injection 1 mg  1 mg Intramuscular Q8H PRN Salley Scarlet, MD   1 mg at 02/22/21 0252  .  lamoTRIgine (LAMICTAL) tablet 12.5 mg  12.5 mg Oral Daily Shelbylynn Walczyk T, MD   12.5 mg at 02/26/21 2233  . lithium carbonate capsule 150 mg  150 mg Oral QHS Neyla Gauntt, Madie Reno, MD   150 mg at 02/26/21 2234  . megestrol (MEGACE) 400 MG/10ML suspension 400 mg  400 mg Oral Daily Sharen Hones, MD   400 mg at 02/26/21 1127  . melatonin tablet 5 mg  5 mg Oral QHS Sharen Hones, MD   5 mg at 02/26/21 2234  . multivitamin with minerals tablet   Oral Daily Agbata, Tochukwu, MD      . ondansetron (ZOFRAN) tablet 4 mg  4 mg Oral Q6H PRN Agbata, Tochukwu, MD       Or  . ondansetron (ZOFRAN) injection 4 mg  4 mg Intravenous Q6H PRN Agbata, Tochukwu, MD      . timolol (TIMOPTIC) 0.25 % ophthalmic solution 1 drop  1 drop Both Eyes BID Loletha Grayer, MD   1 drop at 02/26/21 2409    Musculoskeletal: Strength & Muscle Tone: atrophy Gait & Station: unsteady Patient leans: N/A            Psychiatric Specialty  Exam:  Presentation  General Appearance: No data recorded Eye Contact:No data recorded Speech:No data recorded Speech Volume:No data recorded Handedness:No data recorded  Mood and Affect  Mood:No data recorded Affect:No data recorded  Thought Process  Thought Processes:No data recorded Descriptions of Associations:No data recorded Orientation:No data recorded Thought Content:No data recorded History of Schizophrenia/Schizoaffective disorder:No data recorded Duration of Psychotic Symptoms:No data recorded Hallucinations:No data recorded Ideas of Reference:No data recorded Suicidal Thoughts:No data recorded Homicidal Thoughts:No data recorded  Sensorium  Memory:No data recorded Judgment:No data recorded Insight:No data recorded  Executive Functions  Concentration:No data recorded Attention Span:No data recorded Recall:No data recorded Fund of Knowledge:No data recorded Language:No data recorded  Psychomotor Activity  Psychomotor Activity:No data recorded  Assets   Assets:No data recorded  Sleep  Sleep:No data recorded  Physical Exam: Physical Exam Vitals and nursing note reviewed.  Constitutional:      Appearance: She is ill-appearing and toxic-appearing.  HENT:     Head: Normocephalic and atraumatic.     Mouth/Throat:     Mouth: Mucous membranes are dry.  Eyes:     Pupils: Pupils are equal, round, and reactive to light.  Cardiovascular:     Rate and Rhythm: Normal rate and regular rhythm.  Pulmonary:     Effort: Pulmonary effort is normal.     Breath sounds: Normal breath sounds.  Abdominal:     General: Abdomen is flat.     Palpations: Abdomen is soft.  Skin:    General: Skin is warm and dry.  Neurological:     General: No focal deficit present.     Mental Status: Mental status is at baseline.  Psychiatric:        Attention and Perception: She is inattentive.        Speech: She is noncommunicative.    Review of Systems  Unable to perform ROS: Mental status change   Blood pressure (!) 144/77, pulse 78, temperature 97.6 F (36.4 C), temperature source Oral, resp. rate 16, height 4\' 11"  (1.499 m), weight 39.9 kg, SpO2 97 %. Body mass index is 17.77 kg/m.  Treatment Plan Summary: Plan I restarted her on low levels of lithium and started a low level of lamotrigine yesterday.  I do not think either of those are causing oversedation.  I am concerned that the patient is declining from lack of oral intake and overall poor health.  I am concerned that the family may have slightly unrealistic expectations if they think the patient would be appropriate for a geriatric psychiatry ward.  In her current condition it looks like the medical illness is the more acute issue.  Despite the low level of lithium I am a little worried about it possibly becoming toxic again.  It usually takes a few days for lithium to come to a steady state but I will put in an order for Monday to recheck it.  No change to any other medicine right now.  If this were in  part a depression there is nothing that is going to acutely make a difference to it in the next couple days.  Please asked for psychiatry consultant over the weekend if needed otherwise I will see her Monday.  Disposition: See note above  Alethia Berthold, MD 02/27/2021 6:27 PM

## 2021-02-27 NOTE — TOC Progression Note (Addendum)
Transition of Care [facility]) - Progression Note    Patient Details  Name: Sierra Benson MRN: [id number] Date of Birth: September 15, 1929  [facility]) CM/SW Contact  Beverly Sessions, RN Phone Number: 02/27/2021, 12:47 PM  Clinical Narrative:    MD's now in agreement for inpatient Psych Received call from son Antony Haste.  He is aware of the recommendation and in agreement to proceed with placement   - [facility])  [phone]  fax: [phone] - Per Vinnie Level Currently no beds available, referral faxed  - [facility]) Fax: [phone] - referral faxed  - [facility] - no female beds available - referral faxed to [facility]   [facility])              fax: [phone] - referral faxed - [facility]   (W[facility]S)       Fax: [phone] referral faxed - Ellin Mayhew - fax: [phone]- referral faxed    Will keep submitting referrals to facility    Expected Discharge Plan: [place] Barriers to Discharge: Continued Medical Work up  Expected Discharge Plan and Services Expected Discharge Plan: [place] arrangements for the past 2 months: Single Family Home                                       Social Determinants of Health (SDOH) Interventions    Readmission Risk Interventions No flowsheet data found.

## 2021-02-27 NOTE — Progress Notes (Signed)
PROGRESS NOTE    Sierra Benson  VVO:[id number] DOB: 07-04-1929 DOA: 02/20/2021 PCP: Idelle Crouch, MD    Brief Narrative:  Patient has a past medical history of bipolar disorder and psychosis. Patient admitted with acute delirium and altered mental status. Patient has been given IV fluids. Patient was started on Seroquel as outpatient and since then has not been doing well. She was started empirically on Rocephin and Zithromax for multifocal pneumonia seen on CT scan. Initially her lithium level was high and she was given IV fluids and lithium held. 3/11.  Patient condition is not improving, she has significant dysphagia, not eating.  She has some short of breath, fluids discontinued.  Lithium and lamotrigine was started by psychiatry yesterday.   Assessment & Plan:   Principal Problem:   Delirium Active Problems:   Underweight due to inadequate caloric intake   Bipolar 1 disorder, depressed, moderate (HCC)   Dehydration   Multifocal pneumonia   Bronchiectasis (HCC)   Dementia with behavioral disturbance (HCC)   Elevated lithium level   Stage 3a chronic kidney disease (HCC)   Macrocytic anemia   Acute kidney injury superimposed on CKD ([place])   Failure to thrive in adult   Severe protein-calorie malnutrition ([place])  #1.  Failure to thrive. Anorexia. Severe protein calorie malnutrition pain Hypoglycemia due to poor p.o. intake. Dysphagia. Patient condition does not seem to improve.  She was placed on D5 water with lactated Ringer at 50 mL/h, she has some short of breath today, I will stop with fluids. Patient was also seen by speech therapist, she has high risk for aspiration, she is placed on nectar thickened liquid.  further impair her p.o. intake.  #2. Acute toxic encephalopathy. Probable baseline dementia. Bipolar disorder with depression. Appreciate psychiatry treatment with starting lithium and lamotrigine.  We will follow closely. Discussed with patient and  family, patient could not tolerate higher dose of lithium, combination of medicine was a lower dose is probably the best option.  #3.  Multifocal pneumonia Antibiotics completed per  4.  Acute kidney injury P Improved  5.  Anemia.  Goal of care discussion: Long discussion with the patient daughter and his son on the phone, they still believe that patient condition with partially due to depression with bipolar disorder.  However, patient condition was gradually deteriorating, now she developed significant dysphagia with the risk for aspiration.  She has very little p.o. intake.  In my opinion, patient has very poor prognosis.  She is unlikely to recover. However, family still want patient transferred to [facility] psych unit, psychiatry has approved for the transfer.  Social worker will start to work on it. I also discussed the CODE STATUS, we will use oxygen mask, noninvasive ventilation and medication only.  No intubation or chest compressions.      DVT prophylaxis: Lovenox Code Status: Full Family Communication: As above Disposition Plan:  .   Status is: Inpatient  Remains inpatient appropriate because:Inpatient level of care appropriate due to severity of illness   Dispo: The patient is from: Home              Anticipated d/c is to: pending              Patient currently is not medically stable to d/c.   Difficult to place patient No        I/O last 3 completed shifts: In: 768.4 [I.V.:768.4] Out: 600 [Urine:600] No intake/output data recorded.     Consultants:  Psychiatry  Procedures: None  Antimicrobials: None  Subjective: Has significant dysphagia, was placed on nectar thick liquid.  Patient still has a very poor appetite, she is not drinking or eating.  He still spits out food, or pocket food in mouth. She has some short of breath this morning, but no hypoxia. No fever chills. No abdominal pain or nausea vomiting.  Objective: Vitals:   02/26/21 2101  02/27/21 0351 02/27/21 0908 02/27/21 0910  BP: (!) 141/76 (!) 145/65 138/60   Pulse: 77 75 74 72  Resp: 16 16 18    Temp: 98.2 F (36.8 C) 98.1 F (36.7 C) 98.1 F (36.7 C)   TempSrc: Axillary Axillary Oral   SpO2: 100% 100% 99% 99%  Weight:      Height:        Intake/Output Summary (Last 24 hours) at 02/27/2021 1036 Last data filed at 02/27/2021 0818 Gross per 24 hour  Intake 768.44 ml  Output 300 ml  Net 468.44 ml   Filed Weights   02/20/21 1504  Weight: 39.9 kg    Examination:  General exam: Frail, ill-appearing.  Nearly malnourished Respiratory system: Clear to auscultation. Respiratory effort normal. Cardiovascular system: S1 & S2 heard, RRR. No JVD, murmurs, rubs, gallops or clicks. No pedal edema. Gastrointestinal system: Abdomen is nondistended, soft and nontender. No organomegaly or masses felt. Normal bowel sounds heard. Central nervous system: Alert and oriented x1. No focal neurological deficits. Extremities: Severe muscle atrophy. Skin: No rashes, lesions or ulcers Psychiatry: Confused,    Data Reviewed: I have personally reviewed following labs and imaging studies  CBC: Recent Labs  Lab 02/20/21 1519 02/22/21 0432 02/23/21 0438 02/24/21 0613 02/25/21 0433 02/26/21 0452  WBC 12.3* 12.3* 13.1* 11.8* 12.3* 9.8  NEUTROABS 10.6*  --   --   --   --  7.3  HGB 11.9* 11.0* 12.1 13.1 13.4 13.5  HCT 38.1 34.6* 37.9 40.6 42.8 43.7  MCV 105.0* 105.5* 104.1* 104.1* 105.7* 108.7*  PLT 297 281 293 260 234 177   Basic Metabolic Panel: Recent Labs  Lab 02/23/21 0438 02/24/21 0613 02/25/21 0433 02/26/21 0452 02/27/21 0419  NA 138 136 137 139 139  K 4.2 3.6 3.8 4.1 4.2  CL 110 109 109 111 110  CO2 21* 22 22 20* 23  GLUCOSE 128* 106* 96 59* 97  BUN 17 13 17 23  24*  CREATININE 0.94 0.69 0.67 0.72 0.77  CALCIUM 9.0 8.6* 8.3* 8.6* 8.5*  MG  --   --   --  2.0 2.0  PHOS  --   --   --   --  2.5   GFR: Estimated Creatinine Clearance: 30 mL/min (by C-G  formula based on SCr of 0.77 mg/dL). Liver Function Tests: Recent Labs  Lab 02/20/21 1519  AST 20  ALT 13  ALKPHOS 113  BILITOT 0.9  PROT 8.1  ALBUMIN 3.3*   No results for input(s): LIPASE, AMYLASE in the last 168 hours. No results for input(s): AMMONIA in the last 168 hours. Coagulation Profile: No results for input(s): INR, PROTIME in the last 168 hours. Cardiac Enzymes: Recent Labs  Lab 02/23/21 0438  CKTOTAL 91   BNP (last 3 results) No results for input(s): PROBNP in the last 8760 hours. HbA1C: No results for input(s): HGBA1C in the last 72 hours. CBG: Recent Labs  Lab 02/23/21 1541 02/26/21 0715 02/26/21 0719  GLUCAP 116* 121* 137*   Lipid Profile: No results for input(s): CHOL, HDL, LDLCALC, TRIG, CHOLHDL, LDLDIRECT in the  last 72 hours. Thyroid Function Tests: No results for input(s): TSH, T4TOTAL, FREET4, T3FREE, THYROIDAB in the last 72 hours. Anemia Panel: No results for input(s): VITAMINB12, FOLATE, FERRITIN, TIBC, IRON, RETICCTPCT in the last 72 hours. Sepsis Labs: Recent Labs  Lab 02/20/21 1519 02/22/21 0955  PROCALCITON 0.14 <0.10    Recent Results (from the past 240 hour(s))  Resp Panel by RT-PCR (Flu A&B, Covid) Nasopharyngeal Swab     Status: None   Collection Time: 02/20/21  4:39 PM   Specimen: Nasopharyngeal Swab; Nasopharyngeal(NP) swabs in vial transport medium  Result Value Ref Range Status   SARS Coronavirus 2 by RT PCR NEGATIVE NEGATIVE Final    Comment: (NOTE) SARS-CoV-2 target nucleic acids are NOT DETECTED.  The SARS-CoV-2 RNA is generally detectable in upper respiratory specimens during the acute phase of infection. The lowest concentration of SARS-CoV-2 viral copies this assay can detect is 138 copies/mL. A negative result does not preclude SARS-Cov-2 infection and should not be used as the sole basis for treatment or other patient management decisions. A negative result may occur with  improper specimen  collection/handling, submission of specimen other than nasopharyngeal swab, presence of viral mutation(s) within the areas targeted by this assay, and inadequate number of viral copies(<138 copies/mL). A negative result must be combined with clinical observations, patient history, and epidemiological information. The expected result is Negative.  Fact Sheet for Patients:  EntrepreneurPulse.com.au  Fact Sheet for Healthcare Providers:  IncredibleEmployment.be  This test is no t yet approved or cleared by the [place] FDA and  has been authorized for detection and/or diagnosis of SARS-CoV-2 by FDA under an Emergency Use Authorization (EUA). This EUA will remain  in effect (meaning this test can be used) for the duration of the COVID-19 declaration under Section 564(b)(1) of the Act, 21 U.S.C.section 360bbb-3(b)(1), unless the authorization is terminated  or revoked sooner.       Influenza A by PCR NEGATIVE NEGATIVE Final   Influenza B by PCR NEGATIVE NEGATIVE Final    Comment: (NOTE) The Xpert Xpress SARS-CoV-2/FLU/RSV plus assay is intended as an aid in the diagnosis of influenza from Nasopharyngeal swab specimens and should not be used as a sole basis for treatment. Nasal washings and aspirates are unacceptable for Xpert Xpress SARS-CoV-2/FLU/RSV testing.  Fact Sheet for Patients: EntrepreneurPulse.com.au  Fact Sheet for Healthcare Providers: IncredibleEmployment.be  This test is not yet approved or cleared by the [place] FDA and has been authorized for detection and/or diagnosis of SARS-CoV-2 by FDA under an Emergency Use Authorization (EUA). This EUA will remain in effect (meaning this test can be used) for the duration of the COVID-19 declaration under Section 564(b)(1) of the Act, 21 U.S.C. section 360bbb-3(b)(1), unless the authorization is terminated or revoked.  Performed at [facility], [address] [place], [place] [address]   Aspergillus Ag, BAL/Serum     Status: None   Collection Time: 02/23/21  4:38 AM  Result Value Ref Range Status   Aspergillus Ag, BAL/Serum 0.02 0.00 - 0.49 Index Final    Comment: (NOTE) Performed At: [facility] [place], [place] [id number] Rush Farmer MD QP:[id number]          Radiology Studies: No results found.      Scheduled Meds: . bisacodyl  10 mg Rectal Daily  . enoxaparin (LOVENOX) injection  30 mg Subcutaneous Q24H  . lamoTRIgine  12.5 mg Oral Daily  . lithium carbonate  150 mg Oral QHS  . megestrol  400 mg Oral Daily  . melatonin  5 mg Oral QHS  . multivitamin with minerals   Oral Daily  . timolol  1 drop Both Eyes BID   Continuous Infusions:   LOS: 6 days    Time spent: 38 minutes    Sharen Hones, MD [facility]   To contact the attending provider between 7A-7P or the covering provider during after hours 7P-7A, please log into the web site [email or web address] and access using universal [place] password for that web site. If you do not have the password, please call the hospital operator.  02/27/2021, 10:36 AM

## 2021-02-28 ENCOUNTER — Inpatient Hospital Stay: Payer: Medicare HMO

## 2021-02-28 DIAGNOSIS — J690 Pneumonitis due to inhalation of food and vomit: Secondary | ICD-10-CM

## 2021-02-28 DIAGNOSIS — J9601 Acute respiratory failure with hypoxia: Secondary | ICD-10-CM

## 2021-02-28 DIAGNOSIS — E43 Unspecified severe protein-calorie malnutrition: Secondary | ICD-10-CM | POA: Diagnosis not present

## 2021-02-28 DIAGNOSIS — R627 Adult failure to thrive: Secondary | ICD-10-CM | POA: Diagnosis not present

## 2021-02-28 DIAGNOSIS — R410 Disorientation, unspecified: Secondary | ICD-10-CM | POA: Diagnosis not present

## 2021-02-28 LAB — CREATININE, SERUM
Creatinine, Ser: 0.76 mg/dL (ref 0.44–1.00)
GFR, Estimated: >60 mL/min (ref >60)

## 2021-02-28 MED ORDER — MORPHINE SULFATE (CONCENTRATE) 10 MG/0.5ML PO SOLN
4.0000 mg | ORAL | Status: DC | PRN
  Administered 2021-03-01 (×2): 4 mg via SUBLINGUAL
  Filled 2021-02-28 (×2): qty 0.5

## 2021-02-28 NOTE — Progress Notes (Addendum)
PROGRESS NOTE    Sierra Benson  SHF:[id number] DOB: 03-Feb-1929 DOA: 02/20/2021 PCP: Idelle Crouch, MD    Brief Narrative:  Patient has a past medical history of bipolar disorder and psychosis. Patient admitted with acute delirium and altered mental status. Patient has been given IV fluids. Patient was started on Seroquel as outpatient and since then has not been doing well. She was started empirically on Rocephin and Zithromax for multifocal pneumonia seen on CT scan. Initially her lithium level was high and she was given IV fluids and lithium held. 3/11.  Patient condition is not improving, she has significant dysphagia, not eating.  She has some short of breath, fluids discontinued.  Lithium and lamotrigine was started by psychiatry yesterday.   Assessment & Plan:   Principal Problem:   Delirium Active Problems:   Underweight due to inadequate caloric intake   Bipolar 1 disorder, depressed, moderate (HCC)   Dehydration   Multifocal pneumonia   Bronchiectasis (HCC)   Dementia with behavioral disturbance (HCC)   Elevated lithium level   Stage 3a chronic kidney disease (HCC)   Macrocytic anemia   Acute kidney injury superimposed on CKD ([place])   Failure to thrive in adult   Severe protein-calorie malnutrition ([place])   Dysphagia  #1.  Failure to thrive. Anorexia. Severe protein calorie malnutrition pain Hypoglycemia due to poor p.o. intake. Dysphagia. Acute hypoxemic respite failure secondary to aspiration.  #2. Acute toxic encephalopathy. Probable baseline dementia. Bipolar disorder with depression.  Had a long discussion with patient and son and daughter together, patient condition has deteriorated, there is no possible way to sustain her life.  She is actively dying and is suffering from shortness of breath, I advised the family for comfort  care and hospice care.  Family will come over to see her and make their own independent assessment.  They will let me know  their decision.  Addendum: 5027.  Discussed with patient son again, family has requested to place NG tube and attempt to feed the patient.  However, if she will become difficult or patient cannot tolerate, we can discontinue and talk about comfort care.  However, if NG tube is successful, we can attempt starting tube feeding. I also told the son that given the patient frail status, even tube feeding may be dangerous to the patient, may cause aspiration, even cardiac arrest.  They are now aware of the risk.  1746. Tried NG tube, patient fought it, she will not swallow. Not able to put in. Discussed with son, will start SL morphine for air hunger. He will talk with family, most likely start full comfort care tomorrow.   Subjective: Patient is uncomfortable, she has short of breath on 2 L oxygen. Spoke with the nurse, patient has not been able to eat or drink.  She has not been able to take her medicines.   Objective: Vitals:   02/27/21 1548 02/27/21 2029 02/28/21 0349 02/28/21 0801  BP: (!) 144/77 (!) 158/74 (!) 145/76 (!) 159/74  Pulse: 78 86 83 89  Resp: 16 14 16    Temp: 97.6 F (36.4 C) (!) 97.5 F (36.4 C) 97.8 F (36.6 C) 98.2 F (36.8 C)  TempSrc: Oral Oral Oral Oral  SpO2: 97% 94% 99% 100%  Weight:      Height:        Intake/Output Summary (Last 24 hours) at 02/28/2021 0953 Last data filed at 02/28/2021 0550 Gross per 24 hour  Intake 0 ml  Output 400 ml  Net -400 ml   Filed Weights   02/20/21 1504  Weight: 39.9 kg    Examination:  General exam: Ill-appearing, severely malnourished. Respiratory system: Some tachypnea, uncomfortable. Cardiovascular system: Regular no JVD, murmurs, rubs, gallops or clicks. No pedal edema. Gastrointestinal system: Abdomen is nondistended, soft and nontender. No organomegaly or masses felt. Normal bowel sounds heard. Central nervous system: Minimally responsive. Extremities: Symmetric 5 x 5 power. Skin: Mucous membrane dry     Data  Reviewed: I have personally reviewed following labs and imaging studies  CBC: Recent Labs  Lab 02/22/21 0432 02/23/21 0438 02/24/21 0613 02/25/21 0433 02/26/21 0452  WBC 12.3* 13.1* 11.8* 12.3* 9.8  NEUTROABS  --   --   --   --  7.3  HGB 11.0* 12.1 13.1 13.4 13.5  HCT 34.6* 37.9 40.6 42.8 43.7  MCV 105.5* 104.1* 104.1* 105.7* 108.7*  PLT 281 293 260 234 294   Basic Metabolic Panel: Recent Labs  Lab 02/23/21 0438 02/24/21 0613 02/25/21 0433 02/26/21 0452 02/27/21 0419 02/28/21 0528  NA 138 136 137 139 139  --   K 4.2 3.6 3.8 4.1 4.2  --   CL 110 109 109 111 110  --   CO2 21* 22 22 20* 23  --   GLUCOSE 128* 106* 96 59* 97  --   BUN 17 13 17 23  24*  --   CREATININE 0.94 0.69 0.67 0.72 0.77 0.76  CALCIUM 9.0 8.6* 8.3* 8.6* 8.5*  --   MG  --   --   --  2.0 2.0  --   PHOS  --   --   --   --  2.5  --    GFR: Estimated Creatinine Clearance: 30 mL/min (by C-G formula based on SCr of 0.76 mg/dL). Liver Function Tests: No results for input(s): AST, ALT, ALKPHOS, BILITOT, PROT, ALBUMIN in the last 168 hours. No results for input(s): LIPASE, AMYLASE in the last 168 hours. No results for input(s): AMMONIA in the last 168 hours. Coagulation Profile: No results for input(s): INR, PROTIME in the last 168 hours. Cardiac Enzymes: Recent Labs  Lab 02/23/21 0438  CKTOTAL 91   BNP (last 3 results) No results for input(s): PROBNP in the last 8760 hours. HbA1C: No results for input(s): HGBA1C in the last 72 hours. CBG: Recent Labs  Lab 02/23/21 1541 02/26/21 0715 02/26/21 0719  GLUCAP 116* 121* 137*   Lipid Profile: No results for input(s): CHOL, HDL, LDLCALC, TRIG, CHOLHDL, LDLDIRECT in the last 72 hours. Thyroid Function Tests: No results for input(s): TSH, T4TOTAL, FREET4, T3FREE, THYROIDAB in the last 72 hours. Anemia Panel: No results for input(s): VITAMINB12, FOLATE, FERRITIN, TIBC, IRON, RETICCTPCT in the last 72 hours. Sepsis Labs: Recent Labs  Lab  02/22/21 0955  PROCALCITON <0.10    Recent Results (from the past 240 hour(s))  Resp Panel by RT-PCR (Flu A&B, Covid) Nasopharyngeal Swab     Status: None   Collection Time: 02/20/21  4:39 PM   Specimen: Nasopharyngeal Swab; Nasopharyngeal(NP) swabs in vial transport medium  Result Value Ref Range Status   SARS Coronavirus 2 by RT PCR NEGATIVE NEGATIVE Final    Comment: (NOTE) SARS-CoV-2 target nucleic acids are NOT DETECTED.  The SARS-CoV-2 RNA is generally detectable in upper respiratory specimens during the acute phase of infection. The lowest concentration of SARS-CoV-2 viral copies this assay can detect is 138 copies/mL. A negative result does not preclude SARS-Cov-2 infection and should not be used as the sole basis  for treatment or other patient management decisions. A negative result may occur with  improper specimen collection/handling, submission of specimen other than nasopharyngeal swab, presence of viral mutation(s) within the areas targeted by this assay, and inadequate number of viral copies(<138 copies/mL). A negative result must be combined with clinical observations, patient history, and epidemiological information. The expected result is Negative.  Fact Sheet for Patients:  EntrepreneurPulse.com.au  Fact Sheet for Healthcare Providers:  IncredibleEmployment.be  This test is no t yet approved or cleared by the [place] FDA and  has been authorized for detection and/or diagnosis of SARS-CoV-2 by FDA under an Emergency Use Authorization (EUA). This EUA will remain  in effect (meaning this test can be used) for the duration of the COVID-19 declaration under Section 564(b)(1) of the Act, 21 U.S.C.section 360bbb-3(b)(1), unless the authorization is terminated  or revoked sooner.       Influenza A by PCR NEGATIVE NEGATIVE Final   Influenza B by PCR NEGATIVE NEGATIVE Final    Comment: (NOTE) The Xpert Xpress  SARS-CoV-2/FLU/RSV plus assay is intended as an aid in the diagnosis of influenza from Nasopharyngeal swab specimens and should not be used as a sole basis for treatment. Nasal washings and aspirates are unacceptable for Xpert Xpress SARS-CoV-2/FLU/RSV testing.  Fact Sheet for Patients: EntrepreneurPulse.com.au  Fact Sheet for Healthcare Providers: IncredibleEmployment.be  This test is not yet approved or cleared by the [place] FDA and has been authorized for detection and/or diagnosis of SARS-CoV-2 by FDA under an Emergency Use Authorization (EUA). This EUA will remain in effect (meaning this test can be used) for the duration of the COVID-19 declaration under Section 564(b)(1) of the Act, 21 U.S.C. section 360bbb-3(b)(1), unless the authorization is terminated or revoked.  Performed at [facility], [address] [place], [place] [address]   Aspergillus Ag, BAL/Serum     Status: None   Collection Time: 02/23/21  4:38 AM  Result Value Ref Range Status   Aspergillus Ag, BAL/Serum 0.02 0.00 - 0.49 Index Final    Comment: (NOTE) Performed At: [facility] [place], [place] [id number] Rush Farmer MD JK:[id number]          Radiology Studies: No results found.      Scheduled Meds: . bisacodyl  10 mg Rectal Daily  . enoxaparin (LOVENOX) injection  30 mg Subcutaneous Q24H  . lamoTRIgine  12.5 mg Oral Daily  . lithium carbonate  150 mg Oral QHS  . megestrol  400 mg Oral Daily  . melatonin  5 mg Oral QHS  . multivitamin with minerals   Oral Daily  . timolol  1 drop Both Eyes BID   Continuous Infusions:   LOS: 7 days    Time spent: 36 minutes    Sharen Hones, MD [facility]   To contact the attending provider between 7A-7P or the covering provider during after hours 7P-7A, please log into the web site [email or web address] and access using universal [place] password for that web  site. If you do not have the password, please call the hospital operator.  02/28/2021, 9:53 AM

## 2021-02-28 NOTE — Progress Notes (Signed)
Sierra Sneddon, RN attempted NG tube insertion due to family wishes. Patient did not tolerate well. She started fighting the nurses, trying to pull the tube out. She did not follow commands to swallow to help the tube progress. The tube was advanced to 40cm but could not go any farther due to patient's resistance. Tube was pulled out and MD notified of attempt.

## 2021-03-01 DIAGNOSIS — J690 Pneumonitis due to inhalation of food and vomit: Secondary | ICD-10-CM

## 2021-03-01 DIAGNOSIS — E162 Hypoglycemia, unspecified: Secondary | ICD-10-CM

## 2021-03-01 DIAGNOSIS — R630 Anorexia: Secondary | ICD-10-CM

## 2021-03-01 DIAGNOSIS — R627 Adult failure to thrive: Secondary | ICD-10-CM | POA: Diagnosis not present

## 2021-03-01 DIAGNOSIS — J9601 Acute respiratory failure with hypoxia: Secondary | ICD-10-CM | POA: Diagnosis not present

## 2021-03-01 DIAGNOSIS — G928 Other toxic encephalopathy: Secondary | ICD-10-CM

## 2021-03-01 DIAGNOSIS — R410 Disorientation, unspecified: Secondary | ICD-10-CM | POA: Diagnosis not present

## 2021-03-01 LAB — PHOSPHORUS: Phosphorus: 4 mg/dL (ref 2.5–4.6)

## 2021-03-01 LAB — CBC WITH DIFFERENTIAL/PLATELET
Abs Immature Granulocytes: 0.11 10*3/uL — ABNORMAL HIGH (ref 0.00–0.07)
Basophils Absolute: 0.1 10*3/uL (ref 0.0–0.1)
Basophils Relative: 0 %
Eosinophils Absolute: 0 10*3/uL (ref 0.0–0.5)
Eosinophils Relative: 0 %
HCT: 41.9 % (ref 36.0–46.0)
Hemoglobin: 13.7 g/dL (ref 12.0–15.0)
Immature Granulocytes: 1 %
Lymphocytes Relative: 7 %
Lymphs Abs: 1.1 10*3/uL (ref 0.7–4.0)
MCH: 34.3 pg — ABNORMAL HIGH (ref 26.0–34.0)
MCHC: 32.7 g/dL (ref 30.0–36.0)
MCV: 105 fL — ABNORMAL HIGH (ref 80.0–100.0)
Monocytes Absolute: 1 10*3/uL (ref 0.1–1.0)
Monocytes Relative: 6 %
Neutro Abs: 13.8 10*3/uL — ABNORMAL HIGH (ref 1.7–7.7)
Neutrophils Relative %: 86 %
Platelets: 275 10*3/uL (ref 150–400)
RBC: 3.99 MIL/uL (ref 3.87–5.11)
RDW: 15 % (ref 11.5–15.5)
WBC: 16.1 10*3/uL — ABNORMAL HIGH (ref 4.0–10.5)
nRBC: 0 % (ref 0.0–0.2)

## 2021-03-01 LAB — BASIC METABOLIC PANEL
Anion gap: 9 (ref 5–15)
BUN: 46 mg/dL — ABNORMAL HIGH (ref 8–23)
CO2: 19 mmol/L — ABNORMAL LOW (ref 22–32)
Calcium: 8.9 mg/dL (ref 8.9–10.3)
Chloride: 111 mmol/L (ref 98–111)
Creatinine, Ser: 1.15 mg/dL — ABNORMAL HIGH (ref 0.44–1.00)
GFR, Estimated: 46 mL/min — ABNORMAL LOW (ref >60)
Glucose, Bld: 100 mg/dL — ABNORMAL HIGH (ref 70–99)
Potassium: 4.7 mmol/L (ref 3.5–5.1)
Sodium: 139 mmol/L (ref 135–145)

## 2021-03-01 LAB — MAGNESIUM: Magnesium: 2.4 mg/dL (ref 1.7–2.4)

## 2021-03-01 MED ORDER — TRAZODONE HCL 50 MG PO TABS: 25.0000 mg | ORAL_TABLET | Freq: Every evening | ORAL | Status: DC | PRN

## 2021-03-01 MED ORDER — GLYCOPYRROLATE 1 MG PO TABS
1.0000 mg | ORAL_TABLET | ORAL | Status: DC | PRN
  Filled 2021-03-01: qty 1

## 2021-03-01 MED ORDER — ACETAMINOPHEN 325 MG PO TABS: 650.0000 mg | ORAL_TABLET | Freq: Four times a day (QID) | ORAL | Status: DC | PRN

## 2021-03-01 MED ORDER — ACETAMINOPHEN 650 MG RE SUPP: 650.0000 mg | Freq: Four times a day (QID) | RECTAL | Status: DC | PRN

## 2021-03-01 MED ORDER — HALOPERIDOL LACTATE 5 MG/ML IJ SOLN: 0.5000 mg | INTRAMUSCULAR | Status: DC | PRN

## 2021-03-01 MED ORDER — GLYCOPYRROLATE 0.2 MG/ML IJ SOLN
0.2000 mg | INTRAMUSCULAR | Status: DC | PRN
  Filled 2021-03-01: qty 1

## 2021-03-01 MED ORDER — MORPHINE SULFATE (PF) 2 MG/ML IV SOLN: 1.0000 mg | INTRAVENOUS | Status: DC | PRN

## 2021-03-01 MED ORDER — HALOPERIDOL LACTATE 2 MG/ML PO CONC
0.5000 mg | ORAL | Status: DC | PRN
  Filled 2021-03-01: qty 0.3

## 2021-03-01 MED ORDER — HALOPERIDOL 0.5 MG PO TABS
0.5000 mg | ORAL_TABLET | ORAL | Status: DC | PRN
  Filled 2021-03-01: qty 1

## 2021-03-01 NOTE — TOC Progression Note (Signed)
Transition of Care [facility]) - Progression Note    Patient Details  Name: Sierra Benson MRN: [id number] Date of Birth: 1929/09/09  Transition of Care [facility]) CM/SW Contact  Zigmund Daniel Dorian Pod, RN Phone [phone] 03/01/2021, 11:19 AM  Clinical Narrative:    Request from the attending referral to hospice facility when bed is available. RN spoke with daughter Mariann Laster) and son Zenia Resides) concerning choice. Referral made to [facility]). Family may wish to change once they speak with the other son on hospice locations however aware of the referral to [place]. No other inquires at this time.  TOC team will continue to follow.   Expected Discharge Plan: [place] Barriers to Discharge: Continued Medical Work up  Expected Discharge Plan and Services Expected Discharge Plan: [place] arrangements for the past 2 months: Single Family Home                                       Social Determinants of Health (SDOH) Interventions    Readmission Risk Interventions No flowsheet data found.

## 2021-03-01 NOTE — Progress Notes (Signed)
PROGRESS NOTE    Sierra Benson  ERD:[id number] DOB: January 21, 1929 DOA: 02/20/2021 PCP: Idelle Crouch, MD    Brief Narrative:  Patient has a past medical history of bipolar disorder and psychosis. Patient admitted with acute delirium and altered mental status. Patient has been given IV fluids. Patient was started on Seroquel as outpatient and since then has not been doing well. She was started empirically on Rocephin and Zithromax for multifocal pneumonia seen on CT scan. Initially her lithium level was high and she was given IV fluids and lithium held. 3/11.Patient condition is not improving, she has significant dysphagia, not eating. She has some short of breath, fluids discontinued. Lithium and lamotrigine was started by psychiatry yesterday. 3/13.  Transition to hospice care, may be able to transfer to inpatient hospice if bed available.   Assessment & Plan:   Principal Problem:   Delirium Active Problems:   Underweight due to inadequate caloric intake   Bipolar 1 disorder, depressed, moderate (HCC)   Dehydration   Multifocal pneumonia   Bronchiectasis (HCC)   Dementia with behavioral disturbance (HCC)   Elevated lithium level   Stage 3a chronic kidney disease (HCC)   Macrocytic anemia   Acute kidney injury superimposed on CKD (HCC)   Failure to thrive in adult   Severe protein-calorie malnutrition (HCC)   Dysphagia   Aspiration pneumonia of both lower lobes due to gastric secretions ([place])   Acute hypoxemic respiratory failure ([place])  Patient condition continued to deteriorate, not able to place NG tube due to patient discomfort.  Patient has not been eating or drinking for at least a week or longer, condition continues to deteriorate.  She also developed hypoxemia.  She is actively dying.  After discussion with POA, decision was made to transition to comfort care.  Patient may be transferred to inpatient hospice when bed is available.    Subjective: Patient  condition continued to deteriorate, she has not been eating or drinking.  She is uncomfortable. She also has short of breath.   Objective: Vitals:   02/28/21 1444 02/28/21 1619 02/28/21 1634 03/01/21 0603  BP: (!) 161/79 (!) 157/77 (!) 149/73 134/80  Pulse: 89 95 96 93  Resp: 20  17 20   Temp: 98.2 F (36.8 C) 98.1 F (36.7 C) 98.5 F (36.9 C) 97.6 F (36.4 C)  TempSrc: Axillary Oral Oral   SpO2: 100% 99% 96% 100%  Weight:      Height:        Intake/Output Summary (Last 24 hours) at 03/01/2021 1108 Last data filed at 03/01/2021 0900 Gross per 24 hour  Intake 0 ml  Output 600 ml  Net -600 ml   Filed Weights   02/20/21 1504  Weight: 39.9 kg    Examination:  General exam: Ill-appearing, severely malnourished Respiratory system: A few rhonchi in the base.  Cardiovascular system: S1 & S2 heard, RRR. No JVD, murmurs, rubs, gallops or clicks. No pedal edema. Gastrointestinal system: Abdomen is nondistended, soft and nontender.  Central nervous system: Minimally responsive. Extremities: Symmetric  Skin: Membranes dry.     Data Reviewed: I have personally reviewed following labs and imaging studies  CBC: Recent Labs  Lab 02/23/21 0438 02/24/21 0613 02/25/21 0433 02/26/21 0452 03/01/21 0446  WBC 13.1* 11.8* 12.3* 9.8 16.1*  NEUTROABS  --   --   --  7.3 13.8*  HGB 12.1 13.1 13.4 13.5 13.7  HCT 37.9 40.6 42.8 43.7 41.9  MCV 104.1* 104.1* 105.7* 108.7* 105.0*  PLT 293  260 234 228 992   Basic Metabolic Panel: Recent Labs  Lab 02/24/21 0613 02/25/21 0433 02/26/21 0452 02/27/21 0419 02/28/21 0528 03/01/21 0446  NA 136 137 139 139  --  139  K 3.6 3.8 4.1 4.2  --  4.7  CL 109 109 111 110  --  111  CO2 22 22 20* 23  --  19*  GLUCOSE 106* 96 59* 97  --  100*  BUN 13 17 23  24*  --  46*  CREATININE 0.69 0.67 0.72 0.77 0.76 1.15*  CALCIUM 8.6* 8.3* 8.6* 8.5*  --  8.9  MG  --   --  2.0 2.0  --  2.4  PHOS  --   --   --  2.5  --  4.0   GFR: Estimated Creatinine  Clearance: 20.9 mL/min (A) (by C-G formula based on SCr of 1.15 mg/dL (H)). Liver Function Tests: No results for input(s): AST, ALT, ALKPHOS, BILITOT, PROT, ALBUMIN in the last 168 hours. No results for input(s): LIPASE, AMYLASE in the last 168 hours. No results for input(s): AMMONIA in the last 168 hours. Coagulation Profile: No results for input(s): INR, PROTIME in the last 168 hours. Cardiac Enzymes: Recent Labs  Lab 02/23/21 0438  CKTOTAL 91   BNP (last 3 results) No results for input(s): PROBNP in the last 8760 hours. HbA1C: No results for input(s): HGBA1C in the last 72 hours. CBG: Recent Labs  Lab 02/23/21 1541 02/26/21 0715 02/26/21 0719  GLUCAP 116* 121* 137*   Lipid Profile: No results for input(s): CHOL, HDL, LDLCALC, TRIG, CHOLHDL, LDLDIRECT in the last 72 hours. Thyroid Function Tests: No results for input(s): TSH, T4TOTAL, FREET4, T3FREE, THYROIDAB in the last 72 hours. Anemia Panel: No results for input(s): VITAMINB12, FOLATE, FERRITIN, TIBC, IRON, RETICCTPCT in the last 72 hours. Sepsis Labs: No results for input(s): PROCALCITON, LATICACIDVEN in the last 168 hours.  Recent Results (from the past 240 hour(s))  Resp Panel by RT-PCR (Flu A&B, Covid) Nasopharyngeal Swab     Status: None   Collection Time: 02/20/21  4:39 PM   Specimen: Nasopharyngeal Swab; Nasopharyngeal(NP) swabs in vial transport medium  Result Value Ref Range Status   SARS Coronavirus 2 by RT PCR NEGATIVE NEGATIVE Final    Comment: (NOTE) SARS-CoV-2 target nucleic acids are NOT DETECTED.  The SARS-CoV-2 RNA is generally detectable in upper respiratory specimens during the acute phase of infection. The lowest concentration of SARS-CoV-2 viral copies this assay can detect is 138 copies/mL. A negative result does not preclude SARS-Cov-2 infection and should not be used as the sole basis for treatment or other patient management decisions. A negative result may occur with  improper specimen  collection/handling, submission of specimen other than nasopharyngeal swab, presence of viral mutation(s) within the areas targeted by this assay, and inadequate number of viral copies(<138 copies/mL). A negative result must be combined with clinical observations, patient history, and epidemiological information. The expected result is Negative.  Fact Sheet for Patients:  EntrepreneurPulse.com.au  Fact Sheet for Healthcare Providers:  IncredibleEmployment.be  This test is no t yet approved or cleared by the [place] FDA and  has been authorized for detection and/or diagnosis of SARS-CoV-2 by FDA under an Emergency Use Authorization (EUA). This EUA will remain  in effect (meaning this test can be used) for the duration of the COVID-19 declaration under Section 564(b)(1) of the Act, 21 U.S.C.section 360bbb-3(b)(1), unless the authorization is terminated  or revoked sooner.  Influenza A by PCR NEGATIVE NEGATIVE Final   Influenza B by PCR NEGATIVE NEGATIVE Final    Comment: (NOTE) The Xpert Xpress SARS-CoV-2/FLU/RSV plus assay is intended as an aid in the diagnosis of influenza from Nasopharyngeal swab specimens and should not be used as a sole basis for treatment. Nasal washings and aspirates are unacceptable for Xpert Xpress SARS-CoV-2/FLU/RSV testing.  Fact Sheet for Patients: EntrepreneurPulse.com.au  Fact Sheet for Healthcare Providers: IncredibleEmployment.be  This test is not yet approved or cleared by the [place] FDA and has been authorized for detection and/or diagnosis of SARS-CoV-2 by FDA under an Emergency Use Authorization (EUA). This EUA will remain in effect (meaning this test can be used) for the duration of the COVID-19 declaration under Section 564(b)(1) of the Act, 21 U.S.C. section 360bbb-3(b)(1), unless the authorization is terminated or revoked.  Performed at [facility], [address] [place], [place] [address]   Aspergillus Ag, BAL/Serum     Status: None   Collection Time: 02/23/21  4:38 AM  Result Value Ref Range Status   Aspergillus Ag, BAL/Serum 0.02 0.00 - 0.49 Index Final    Comment: (NOTE) Performed At: [facility] [place], [place] [id number] Rush Farmer MD BR:[id number]          Radiology Studies: No results found.      Scheduled Meds: . bisacodyl  10 mg Rectal Daily  . melatonin  5 mg Oral QHS  . multivitamin with minerals   Oral Daily  . timolol  1 drop Both Eyes BID   Continuous Infusions:   LOS: 8 days    Time spent: 35 minutes.  More than 50% time spent on direct patient care.    Sharen Hones, MD [facility]   To contact the attending provider between 7A-7P or the covering provider during after hours 7P-7A, please log into the web site [email or web address] and access using universal [place] password for that web site. If you do not have the password, please call the hospital operator.  03/01/2021, 11:08 AM

## 2021-03-01 NOTE — Progress Notes (Signed)
Manufacturing engineer [facility])  Referral received for residential hospice at [facility] in [place].  [facility] will follow up with family and staff once patient has been determined to be eligible for residential hospice.  Venia Carbon RN, BSN, [facility] Liaison

## 2021-03-02 DIAGNOSIS — R630 Anorexia: Secondary | ICD-10-CM

## 2021-03-02 DIAGNOSIS — J690 Pneumonitis due to inhalation of food and vomit: Secondary | ICD-10-CM | POA: Diagnosis not present

## 2021-03-02 DIAGNOSIS — J9601 Acute respiratory failure with hypoxia: Secondary | ICD-10-CM | POA: Diagnosis not present

## 2021-03-02 DIAGNOSIS — Z515 Encounter for palliative care: Secondary | ICD-10-CM | POA: Diagnosis not present

## 2021-03-02 DIAGNOSIS — R410 Disorientation, unspecified: Secondary | ICD-10-CM | POA: Diagnosis not present

## 2021-03-02 MED ORDER — MORPHINE SULFATE (PF) 2 MG/ML IV SOLN: 1.0000 mg | INTRAVENOUS | 0 refills | Status: AC | PRN

## 2021-03-02 MED ORDER — ACETAMINOPHEN 325 MG PO TABS: 650.0000 mg | ORAL_TABLET | Freq: Four times a day (QID) | ORAL | Status: AC | PRN

## 2021-03-02 MED ORDER — MORPHINE SULFATE (CONCENTRATE) 10 MG/0.5ML PO SOLN: 4.0000 mg | ORAL | 0 refills | Status: AC | PRN

## 2021-03-02 MED ORDER — HALOPERIDOL 0.5 MG PO TABS: 0.5000 mg | ORAL_TABLET | ORAL | Status: AC | PRN

## 2021-03-02 MED ORDER — GLYCOPYRROLATE 1 MG PO TABS: 1.0000 mg | ORAL_TABLET | ORAL | Status: AC | PRN

## 2021-03-20 NOTE — Progress Notes (Signed)
[place] [facility]) Hospital Liaison RN note:  Received referral from Raina Mina, [facility] for family interest in [place]. Chart reviewed and eligibility was approved. Spoke with daughter, Mariann Laster to confirm interests and explain services. Mariann Laster verbalized understanding and all questions were answered. She will complete registration paperwork at the [facility] this morning around noon. I will fax the discharge summary once it is available. Transport is being arranged by TOC. Bridget Cobb.  Please call with any hospice related questions or concerns.  Thank you for the opportunity to participate in this patient's care.  Zandra Abts, RN [facility] Liaison [phone]

## 2021-03-20 NOTE — Care Management Important Message (Signed)
Important Message  Patient Details  Name: Sierra Benson MRN: [id number] Date of Birth: Jan 07, 1929   Medicare Important Message Given:  Other (see comment)  On comfort care measures.  Medicare IM withheld at this time out of respect for patient and family.     Dannette Barbara Mar 20, 2021, 8:23 AM

## 2021-03-20 NOTE — Discharge Summary (Signed)
Physician Discharge Summary  Patient ID: Sierra Benson MRN: [id number] DOB/AGE: 85-Sep-1930 85 y.o.  Admit date: 02/20/2021 Discharge date: 03/08/21  Admission Diagnoses:  Discharge Diagnoses:  Principal Problem:   Delirium Active Problems:   Underweight due to inadequate caloric intake   Bipolar 1 disorder, depressed, moderate (HCC)   Dehydration   Multifocal pneumonia   Bronchiectasis (HCC)   Dementia with behavioral disturbance (HCC)   Elevated lithium level   Stage 3a chronic kidney disease (HCC)   Macrocytic anemia   Acute kidney injury superimposed on CKD (HCC)   Failure to thrive in adult   Severe protein-calorie malnutrition ([place])   Dysphagia   Aspiration pneumonia of both lower lobes due to gastric secretions (HCC)   Acute hypoxemic respiratory failure ([place])   Anorexia   Hypoglycemia   Toxic encephalitis   Discharged Condition: Terminal  Hospital Course:   Patient has a past medical history of bipolar disorder and psychosis. Patient admitted with acute delirium and altered mental status. Patient has been given IV fluids. Patient was started on Seroquel as outpatient and since then has not been doing well. She was started empirically on Rocephin and Zithromax for multifocal pneumonia seen on CT scan. Initially her lithium level was high and she was given IV fluids and lithium held. 3/11.Patient condition is not improving, she has significant dysphagia, not eating. She has some short of breath, fluids discontinued. Lithium and lamotrigine was started by psychiatry yesterday. 3/13.  Transition to cpmfort care as patient has not been able to eat, she is actively dying. Patient is accepted to inpatient hospice, will transfer today.  Consults: None  Significant Diagnostic Studies:   Treatments: Supportive care  Discharge Exam: Blood pressure (!) 98/53, pulse 95, temperature 97.9 F (36.6 C), temperature source Oral, resp. rate 20, height 4\' 11"  (1.499  m), weight 39.9 kg, SpO2 91 %. General appearance: Unresponsive, ill appearing Resp: clear to auscultation bilaterally Cardio: Regular, no murmur GI: Soft Extremities: muscle atrophy  Disposition: Discharge disposition: 85-Hospice/Medical Facility       Discharge Instructions    Diet general   Complete by: As directed    NPO   Increase activity slowly   Complete by: As directed      Allergies as of 08-Mar-2021      Reactions   Prednisone Swelling, Other (See Comments)   Insomnia   Propoxyphene Hives   Celecoxib Nausea And Vomiting   Ibuprofen Nausea And Vomiting      Medication List    STOP taking these medications   bimatoprost 0.01 % Soln Commonly known as: LUMIGAN   cephALEXin 250 MG capsule Commonly known as: KEFLEX   Flutter Devi   lithium 300 MG tablet   lithium carbonate 450 MG CR tablet Commonly known as: ESKALITH   magnesium oxide 400 MG tablet Commonly known as: MAG-OX   MULTIVITAMIN ADULT PO   QUEtiapine 25 MG tablet Commonly known as: SEROQUEL   timolol 0.25 % ophthalmic solution Commonly known as: TIMOPTIC     TAKE these medications   acetaminophen 325 MG tablet Commonly known as: TYLENOL Take 2 tablets (650 mg total) by mouth every 6 (six) hours as needed for mild pain (or Fever >/= 101).   glycopyrrolate 1 MG tablet Commonly known as: ROBINUL Take 1 tablet (1 mg total) by mouth every 4 (four) hours as needed (excessive secretions).   haloperidol 0.5 MG tablet Commonly known as: HALDOL Take 1 tablet (0.5 mg total) by mouth every 4 (four) hours  as needed for agitation (or delirium).   morphine 2 MG/ML injection Inject 0.5 mLs (1 mg total) into the vein every 2 (two) hours as needed (or dyspnea).   morphine CONCENTRATE 10 MG/0.5ML Soln concentrated solution Place 0.2 mLs (4 mg total) under the tongue every 2 (two) hours as needed for shortness of breath (air hunger).        Signed: Sharen Hones Mar 26, 2021, 11:13 AM

## 2021-03-20 NOTE — TOC Transition Note (Signed)
Transition of Care [facility]) - CM/SW Discharge Note   Patient Details  Name: Sierra Benson MRN: [id number] Date of Birth: Feb 27, 1929  Transition of [facility]) CM/SW Contact:  Eileen Stanford, LCSW Phone Number: 03-29-2021, 11:45 AM   Clinical Narrative:   CSW called [facility] requesting pt be picked up prior to 1:00. They couldn't make any promises but said it should work. Transport arranged. DNR signed on chart. RN aware. NO additional needs.    Final next level of care: [place] Barriers to Discharge: No Barriers Identified   Patient Goals and CMS Choice        Discharge Placement              Patient chooses bed at:  [facility]) Patient to be transferred to facility by: [facility]   Patient and family notified of of transfer: 03/29/21  Discharge Plan and Services                                     Social Determinants of Health (SDOH) Interventions     Readmission Risk Interventions No flowsheet data found.

## 2021-03-20 NOTE — Progress Notes (Signed)
PT Cancellation Note  Patient Details Name: Sierra Benson MRN: [id number] DOB: 08/13/1929   Cancelled Treatment:    Reason Eval/Treat Not Completed: Fatigue/lethargy limiting ability to participate. Patient now on comfort care. PT not appropriate. Will sign off.    [place] 2021/03/18, 9:22 AM

## 2021-03-20 NOTE — Progress Notes (Signed)
OT Cancellation Note  Patient Details Name: Sierra Benson MRN: [id number] DOB: 1929-09-17   Cancelled Treatment:    Reason Eval/Treat Not Completed: Other (comment). Chart reviewed. Pt transitioned to comfort care. Will sign off.   Hanley Hays, MPH, MS, OTR/L ascom [phone] 2021/03/14, 7:57 AM

## 2021-03-20 NOTE — Progress Notes (Signed)
Report given to Santiago Glad, RN of [place] [facility] [place].

## 2021-03-20 NOTE — Progress Notes (Signed)
Nutrition Brief Note  Chart reviewed. Pt now transitioning to comfort care.  No further nutrition interventions warranted at this time.  Please re-consult as needed.   Tashonna Descoteaux W, RD, LDN, CDCES Registered Dietitian II Certified Diabetes Care and Education Specialist Please refer to AMION for RD and/or RD on-call/weekend/after hours pager  

## 2021-03-20 NOTE — Progress Notes (Addendum)
Pt discharged to Hospice in care of [facility], disoriented/minimally responsive per baseline.   Attempted to call [facility] for report with no answer.

## 2021-03-20 NOTE — Progress Notes (Signed)
Daily Progress Note   Patient Name: Sierra Benson       Date: 03/14/21 DOB: Apr 21, 1929  Age: 87 y.o. MRN#: [id number] Attending Physician: Sharen Hones, MD Primary Care Physician: Idelle Crouch, MD Admit Date: 02/20/2021  Reason for Consultation/Follow-up: Establishing goals of care  Subjective: Patient is resting in bed, non-responsive to voice or touch. No distress noted at this time. No family at bedside.   Patient has been shifted to comfort focused care and is being discharged to hospice facility.   Length of Stay: 9  Current Medications: Scheduled Meds:  . bisacodyl  10 mg Rectal Daily  . melatonin  5 mg Oral QHS  . multivitamin with minerals   Oral Daily  . timolol  1 drop Both Eyes BID    Continuous Infusions:   PRN Meds: acetaminophen **OR** acetaminophen, glycopyrrolate **OR** glycopyrrolate **OR** glycopyrrolate, haloperidol **OR** haloperidol **OR** haloperidol lactate, haloperidol lactate, morphine injection, morphine CONCENTRATE, ondansetron **OR** ondansetron (ZOFRAN) IV, traZODone  Physical Exam Pulmonary:     Comments: Regular rhythm.  Skin:    General: Skin is warm and dry.             Vital Signs: BP (!) 98/53   Pulse 95   Temp 97.9 F (36.6 C) (Oral)   Resp 20   Ht 4\' 11"  (1.499 m)   Wt 39.9 kg   SpO2 91%   BMI 17.77 kg/m  SpO2: SpO2: 91 % O2 Device: O2 Device: Nasal Cannula O2 Flow Rate: O2 Flow Rate (L/min): 2 L/min  Intake/output summary:   Intake/Output Summary (Last 24 hours) at 03/14/21 1151 Last data filed at 2021-03-14 0523 Gross per 24 hour  Intake --  Output 100 ml  Net -100 ml   LBM: Last BM Date: 02/25/21 Baseline Weight: Weight: 39.9 kg Most recent weight: Weight: 39.9 kg        Patient Active Problem List    Diagnosis Date Noted  . Anorexia 03/01/2021  . Hypoglycemia 03/01/2021  . Toxic encephalitis 03/01/2021  . Aspiration pneumonia of both lower lobes due to gastric secretions ([place]) 02/28/2021  . Acute hypoxemic respiratory failure ([place]) 02/28/2021  . Dysphagia 02/27/2021  . Failure to thrive in adult 02/25/2021  . Severe protein-calorie malnutrition ([place]) 02/25/2021  . Acute kidney injury superimposed on CKD ([place])   .  Dementia with behavioral disturbance ([place])   . Elevated lithium level   . Stage 3a chronic kidney disease ([place])   . Macrocytic anemia   . Dehydration 02/21/2021  . Multifocal pneumonia 02/21/2021  . Bronchiectasis ([place]) 02/21/2021  . Delirium 02/20/2021  . Crushing injury of finger 06/10/2017  . Mucoid impaction of bronchi 06/24/2016  . Pseudophakia of left eye 04/27/2016  . Bipolar 1 disorder, depressed, moderate ([place])   . Right lower quadrant pain 10/29/2015  . Hemorrhage of anus and rectum 10/29/2015  . History of palpitations 06/19/2015  . BP (high blood pressure) 06/19/2015  . Underweight due to inadequate caloric intake 06/19/2015  . Prediabetes 06/19/2015  . Primary open angle glaucoma of left eye, severe stage 01/09/2015  . 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 Cataract 06/27/2012    Palliative Care Assessment & Plan    Recommendations/Plan:  Patient is being moved to hospice facility.    Code Status:    Code Status Orders  (From admission, onward)         Start     Ordered   03/01/21 1104  Do not attempt resuscitation (DNR)  Continuous       Question Answer Comment  In the event of cardiac or respiratory ARREST Do not call a "code blue"   In the event of cardiac or respiratory ARREST Do not perform Intubation, CPR, defibrillation or ACLS   In the event of cardiac or respiratory ARREST Use medication by any route, position, wound care, and other measures to relive pain and suffering. May use oxygen, suction and manual treatment of airway  obstruction as needed for comfort.      03/01/21 1107        Code Status History    Date Active Date Inactive Code Status Order ID Comments User Context   02/27/2021 1104 03/01/2021 1107 Partial Code [id number]  Sharen Hones, MD Inpatient   02/27/2021 1058 02/27/2021 1104 Partial Code [id number]  Sharen Hones, MD Inpatient   02/21/2021 1456 02/27/2021 1058 Full Code [id number]  Collier Bullock, MD ED   Advance Care Planning Activity       Prognosis:   < 2 weeks   Thank you for allowing the Palliative Medicine Team to assist in the care of this patient.   Total Time 15 min Prolonged Time Billed  no       Greater than 50%  of this time was spent counseling and coordinating care related to the above assessment and plan.  Asencion Gowda, NP  Please contact Palliative Medicine Team phone at [phone] for questions and concerns.

## 2021-03-20 DEATH — deceased
